# Patient Record
Sex: Male | Born: 1965 | Race: White | Hispanic: No | Marital: Married | Smoking: Former smoker
Health system: Southern US, Community
[De-identification: ages and names within clinical notes are randomized; demographics above are authoritative.]

## PROBLEM LIST (undated history)

## (undated) DIAGNOSIS — IMO0001 Reserved for inherently not codable concepts without codable children: Secondary | ICD-10-CM

## (undated) DIAGNOSIS — F1020 Alcohol dependence, uncomplicated: Secondary | ICD-10-CM

## (undated) DIAGNOSIS — E785 Hyperlipidemia, unspecified: Secondary | ICD-10-CM

## (undated) DIAGNOSIS — E119 Type 2 diabetes mellitus without complications: Secondary | ICD-10-CM

## (undated) DIAGNOSIS — G4733 Obstructive sleep apnea (adult) (pediatric): Secondary | ICD-10-CM

## (undated) DIAGNOSIS — K5792 Diverticulitis of intestine, part unspecified, without perforation or abscess without bleeding: Secondary | ICD-10-CM

## (undated) DIAGNOSIS — F329 Major depressive disorder, single episode, unspecified: Secondary | ICD-10-CM

## (undated) DIAGNOSIS — F419 Anxiety disorder, unspecified: Secondary | ICD-10-CM

## (undated) DIAGNOSIS — Z9989 Dependence on other enabling machines and devices: Secondary | ICD-10-CM

## (undated) DIAGNOSIS — R030 Elevated blood-pressure reading, without diagnosis of hypertension: Secondary | ICD-10-CM

## (undated) DIAGNOSIS — K760 Fatty (change of) liver, not elsewhere classified: Secondary | ICD-10-CM

## (undated) HISTORY — DX: Major depressive disorder, single episode, unspecified: F32.9

## (undated) HISTORY — DX: Reserved for inherently not codable concepts without codable children: IMO0001

## (undated) HISTORY — DX: Type 2 diabetes mellitus without complications: E11.9

## (undated) HISTORY — DX: Alcohol dependence, uncomplicated: F10.20

## (undated) HISTORY — DX: Hyperlipidemia, unspecified: E78.5

## (undated) HISTORY — DX: Anxiety disorder, unspecified: F41.9

## (undated) HISTORY — DX: Elevated blood-pressure reading, without diagnosis of hypertension: R03.0

---

## 1995-02-06 HISTORY — PX: APPENDECTOMY: SHX54

## 2005-12-07 ENCOUNTER — Emergency Department (HOSPITAL_COMMUNITY): Admission: EM | Admit: 2005-12-07 | Discharge: 2005-12-07 | Payer: Self-pay | Admitting: Family Medicine

## 2005-12-14 ENCOUNTER — Emergency Department (HOSPITAL_COMMUNITY): Admission: EM | Admit: 2005-12-14 | Discharge: 2005-12-14 | Payer: Self-pay | Admitting: Emergency Medicine

## 2007-03-22 ENCOUNTER — Emergency Department (HOSPITAL_COMMUNITY): Admission: EM | Admit: 2007-03-22 | Discharge: 2007-03-22 | Payer: Self-pay | Admitting: Emergency Medicine

## 2008-12-25 ENCOUNTER — Emergency Department (HOSPITAL_COMMUNITY): Admission: EM | Admit: 2008-12-25 | Discharge: 2008-12-25 | Payer: Self-pay | Admitting: Family Medicine

## 2010-09-29 ENCOUNTER — Encounter: Payer: Self-pay | Admitting: Pulmonary Disease

## 2010-09-29 ENCOUNTER — Ambulatory Visit (INDEPENDENT_AMBULATORY_CARE_PROVIDER_SITE_OTHER): Payer: PRIVATE HEALTH INSURANCE | Admitting: Pulmonary Disease

## 2010-09-29 VITALS — BP 120/80 | HR 88 | Temp 98.9°F | Ht 69.0 in | Wt 256.6 lb

## 2010-09-29 DIAGNOSIS — G4733 Obstructive sleep apnea (adult) (pediatric): Secondary | ICD-10-CM | POA: Insufficient documentation

## 2010-09-29 NOTE — Assessment & Plan Note (Signed)
The patient's history is very suggestive of clinically significant sleep apnea.  He has loud snoring at night, an abnormal breathing pattern during sleep, frequent awakenings, and nonrestorative sleep.  He also has significant daytime sleepiness.  I have had a long discussion with the pt about sleep apnea, including its impact on QOL and CV health.  I think he needs to have a sleep study for diagnosis, and the patient is agreeable to this approach.

## 2010-09-29 NOTE — Progress Notes (Signed)
  Subjective:    Patient ID: Ricky Molina, male    DOB: 08/19/1965, 45 y.o.   MRN: [id number]  HPI The patient is a 45 year old male who I was asked to see for possible obstructive sleep apnea.  He has been noted to have loud snoring, as well as an abnormal breathing pattern during sleep.  He denies any choking arousals.  He has frequent awakenings at night, and is never rested upon arising.  He notes definite sleepiness during the day with periods of inactivity, and sometimes takes naps at lunch.  He will follow sleep easily while reading, and can doze in the evenings with television.  He denies any sleepiness with driving.  Patient states that his weight is up over 30 pounds over the last 2 years, and his Epworth score today is 13.  Sleep Questionnaire: What time do you typically go to bed?( Between what hours) 9:30 to 10:30 pm How long does it take you to fall asleep? 5 mins How many times during the night do you wake up? 1 What time do you get out of bed to start your day? 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 (In pounds) 50 lb (22.68 kg) Have you ever had a sleep study before? No Do you currently use CPAP? No Do you wear oxygen at any time? No    Review of Systems  Constitutional: Positive for unexpected weight change. Negative for fever.  HENT: Positive for congestion, sneezing and dental problem. Negative for ear pain, nosebleeds, sore throat, rhinorrhea, trouble swallowing, postnasal drip and sinus pressure.   Eyes: Negative for redness and itching.  Respiratory: Positive for cough and shortness of breath. Negative for chest tightness and wheezing.   Cardiovascular: Negative for palpitations and leg swelling.  Gastrointestinal: Negative for nausea and vomiting.  Genitourinary: Negative for dysuria.  Musculoskeletal: Positive for joint swelling.  Skin: Negative for rash.  Neurological:  Negative for headaches.  Hematological: Does not bruise/bleed easily.  Psychiatric/Behavioral: Negative for dysphoric mood. The patient is not nervous/anxious.        Objective:   Physical Exam Constitutional:  Obese male, no acute distress  HENT:  Nares patent without discharge, but large turbinates  Oropharynx without exudate, palate and uvula are thick and elongated, narrowing posteriorly  Eyes:  Perrla, eomi, no scleral icterus  Neck:  No JVD, no TMG  Cardiovascular:  Normal rate, regular rhythm, no rubs or gallops.  No murmurs        Intact distal pulses  Pulmonary :  Normal breath sounds, no stridor or respiratory distress   No rales, rhonchi, or wheezing  Abdominal:  Soft, nondistended, bowel sounds present.  No tenderness noted.   Musculoskeletal:  No lower extremity edema noted.  Lymph Nodes:  No cervical lymphadenopathy noted  Skin:  No cyanosis noted  Neurologic:  Alert, appropriate, moves all 4 extremities without obvious deficit.         Assessment & Plan:

## 2010-09-29 NOTE — Patient Instructions (Signed)
Will schedule for a sleep study.  Will call once results are available Work on weight loss

## 2010-10-10 ENCOUNTER — Ambulatory Visit: Payer: PRIVATE HEALTH INSURANCE | Admitting: Pulmonary Disease

## 2010-10-10 ENCOUNTER — Ambulatory Visit (INDEPENDENT_AMBULATORY_CARE_PROVIDER_SITE_OTHER): Payer: PRIVATE HEALTH INSURANCE | Admitting: Pulmonary Disease

## 2010-10-10 DIAGNOSIS — G4733 Obstructive sleep apnea (adult) (pediatric): Secondary | ICD-10-CM

## 2010-10-11 ENCOUNTER — Other Ambulatory Visit: Payer: Self-pay | Admitting: Pulmonary Disease

## 2010-10-11 DIAGNOSIS — G4733 Obstructive sleep apnea (adult) (pediatric): Secondary | ICD-10-CM

## 2010-10-13 ENCOUNTER — Other Ambulatory Visit: Payer: Self-pay | Admitting: Pulmonary Disease

## 2010-10-13 DIAGNOSIS — G4733 Obstructive sleep apnea (adult) (pediatric): Secondary | ICD-10-CM

## 2010-10-16 ENCOUNTER — Other Ambulatory Visit: Payer: Self-pay | Admitting: Pulmonary Disease

## 2010-10-16 DIAGNOSIS — G4733 Obstructive sleep apnea (adult) (pediatric): Secondary | ICD-10-CM

## 2010-10-27 LAB — I-STAT 8, (EC8 V) (CONVERTED LAB)
BUN: 5 — ABNORMAL LOW
Hemoglobin: 15.6
Sodium: 139
pCO2, Ven: 42.8 — ABNORMAL LOW
pH, Ven: 7.388 — ABNORMAL HIGH

## 2010-10-27 LAB — DIFFERENTIAL
Basophils Absolute: 0
Eosinophils Absolute: 0.1
Eosinophils Relative: 2
Lymphocytes Relative: 41

## 2010-10-27 LAB — POCT I-STAT CREATININE: Operator id: 265201

## 2010-10-27 LAB — CBC
MCV: 95.5
RBC: 4.58
RDW: 12.8

## 2010-10-27 LAB — POCT CARDIAC MARKERS: Troponin i, poc: <0.05

## 2010-12-07 ENCOUNTER — Other Ambulatory Visit: Payer: Self-pay | Admitting: Pulmonary Disease

## 2010-12-07 DIAGNOSIS — G4733 Obstructive sleep apnea (adult) (pediatric): Secondary | ICD-10-CM

## 2011-01-05 ENCOUNTER — Ambulatory Visit: Payer: PRIVATE HEALTH INSURANCE | Admitting: Pulmonary Disease

## 2011-01-12 ENCOUNTER — Encounter: Payer: Self-pay | Admitting: Pulmonary Disease

## 2011-01-12 ENCOUNTER — Ambulatory Visit (INDEPENDENT_AMBULATORY_CARE_PROVIDER_SITE_OTHER): Payer: PRIVATE HEALTH INSURANCE | Admitting: Pulmonary Disease

## 2011-01-12 DIAGNOSIS — G4733 Obstructive sleep apnea (adult) (pediatric): Secondary | ICD-10-CM

## 2011-01-12 DIAGNOSIS — Z23 Encounter for immunization: Secondary | ICD-10-CM

## 2011-01-12 MED ORDER — VARENICLINE TARTRATE 1 MG PO TABS: 1.0000 mg | ORAL_TABLET | Freq: Two times a day (BID) | ORAL | Status: DC

## 2011-01-12 MED ORDER — VARENICLINE TARTRATE 0.5 MG X 11 & 1 MG X 42 PO MISC: ORAL | Status: DC

## 2011-01-12 NOTE — Patient Instructions (Signed)
Will have cpap machine put on pressure of 14. Work on [employer] loss Will give you a prescription for chantix for smoking cessation.  If doing well, followup with me in one year.

## 2011-01-12 NOTE — Assessment & Plan Note (Signed)
The patient is doing fairly well with CPAP, and has excellent compliance on his recent download.  I've asked him to continue on this, and to work aggressively on weight loss.  He is to let me know if he has any issues with his CPAP, including worsening mask leaks.  He will followup with me in one year if doing well.

## 2011-01-12 NOTE — Progress Notes (Signed)
  Subjective:    Patient ID: Ricky Molina, male    DOB: [birth year], 45 y.o.   MRN: [id number]  HPI Patient comes in today for followup of his obstructive sleep apnea.  His download today shows excellent compliance, no significant mask leak, and an optimal CPAP pressure of 14 cm.  The patient feels that CPAP has helped his sleep, and that he is dreaming more than he ever has.  He is not sure if it is made a big impact to his daytime alertness, but he appears much more awake and bright by my eye.  He has occasional leaks with a full facemask, but these do not occur frequently.  If this problem worsens, I have asked him to consider trying a different mask.  The patient is also asking me about something to help him quit smoking.  I have had a long conversation with him about this.   Review of Systems  Constitutional: Negative for fever and unexpected weight change.  HENT: Positive for ear pain, congestion, rhinorrhea and postnasal drip. Negative for nosebleeds, sore throat, sneezing, trouble swallowing, dental problem and sinus pressure.   Eyes: Positive for redness and itching.  Respiratory: Positive for cough, shortness of breath and wheezing. Negative for chest tightness.   Cardiovascular: Negative for palpitations and leg swelling.  Gastrointestinal: Negative for nausea and vomiting.  Genitourinary: Negative for dysuria.  Musculoskeletal: Negative for joint swelling.  Skin: Negative for rash.  Neurological: Negative for headaches.  Hematological: Does not bruise/bleed easily.  Psychiatric/Behavioral: Negative for dysphoric mood. The patient is not nervous/anxious.        Objective:   Physical Exam Overweight male in no acute distress No skin breakdown or pressure necrosis from the CPAP mask Lower extremities without edema, no cyanosis noted Alert and oriented, moves all 4 extremities.       Assessment & Plan:

## 2011-03-04 ENCOUNTER — Other Ambulatory Visit: Payer: Self-pay | Admitting: Pulmonary Disease

## 2011-03-23 ENCOUNTER — Inpatient Hospital Stay (HOSPITAL_COMMUNITY)
Admission: EM | Admit: 2011-03-23 | Discharge: 2011-03-25 | DRG: 392 | Disposition: A | Discharge status: Home / Self Care | Payer: PRIVATE HEALTH INSURANCE | Attending: Internal Medicine | Admitting: Internal Medicine

## 2011-03-23 ENCOUNTER — Emergency Department (HOSPITAL_COMMUNITY): Payer: PRIVATE HEALTH INSURANCE

## 2011-03-23 ENCOUNTER — Encounter (HOSPITAL_COMMUNITY): Payer: Self-pay | Admitting: Emergency Medicine

## 2011-03-23 DIAGNOSIS — R112 Nausea with vomiting, unspecified: Secondary | ICD-10-CM | POA: Diagnosis present

## 2011-03-23 DIAGNOSIS — F1020 Alcohol dependence, uncomplicated: Secondary | ICD-10-CM | POA: Diagnosis present

## 2011-03-23 DIAGNOSIS — Z87891 Personal history of nicotine dependence: Secondary | ICD-10-CM

## 2011-03-23 DIAGNOSIS — K760 Fatty (change of) liver, not elsewhere classified: Secondary | ICD-10-CM | POA: Diagnosis present

## 2011-03-23 DIAGNOSIS — E871 Hypo-osmolality and hyponatremia: Secondary | ICD-10-CM | POA: Diagnosis present

## 2011-03-23 DIAGNOSIS — K5792 Diverticulitis of intestine, part unspecified, without perforation or abscess without bleeding: Secondary | ICD-10-CM | POA: Diagnosis present

## 2011-03-23 DIAGNOSIS — R1031 Right lower quadrant pain: Secondary | ICD-10-CM | POA: Diagnosis present

## 2011-03-23 DIAGNOSIS — K7689 Other specified diseases of liver: Secondary | ICD-10-CM | POA: Diagnosis present

## 2011-03-23 DIAGNOSIS — G4733 Obstructive sleep apnea (adult) (pediatric): Secondary | ICD-10-CM | POA: Diagnosis present

## 2011-03-23 DIAGNOSIS — F1010 Alcohol abuse, uncomplicated: Secondary | ICD-10-CM | POA: Diagnosis present

## 2011-03-23 DIAGNOSIS — K5732 Diverticulitis of large intestine without perforation or abscess without bleeding: Principal | ICD-10-CM | POA: Diagnosis present

## 2011-03-23 DIAGNOSIS — N390 Urinary tract infection, site not specified: Secondary | ICD-10-CM | POA: Diagnosis present

## 2011-03-23 DIAGNOSIS — K649 Unspecified hemorrhoids: Secondary | ICD-10-CM | POA: Diagnosis present

## 2011-03-23 DIAGNOSIS — K7010 Alcoholic hepatitis without ascites: Secondary | ICD-10-CM | POA: Diagnosis present

## 2011-03-23 HISTORY — DX: Diverticulitis of intestine, part unspecified, without perforation or abscess without bleeding: K57.92

## 2011-03-23 HISTORY — DX: Alcohol dependence, uncomplicated: F10.20

## 2011-03-23 HISTORY — DX: Dependence on other enabling machines and devices: Z99.89

## 2011-03-23 HISTORY — DX: Obstructive sleep apnea (adult) (pediatric): G47.33

## 2011-03-23 HISTORY — DX: Fatty (change of) liver, not elsewhere classified: K76.0

## 2011-03-23 LAB — CBC
MCHC: 36 g/dL (ref 30.0–36.0)
MCV: 98.2 fL (ref 78.0–100.0)
RBC: 5.46 MIL/uL (ref 4.22–5.81)
WBC: 11.5 10*3/uL — ABNORMAL HIGH (ref 4.0–10.5)

## 2011-03-23 LAB — COMPREHENSIVE METABOLIC PANEL
AST: 72 U/L — ABNORMAL HIGH (ref 0–37)
Albumin: 3.4 g/dL — ABNORMAL LOW (ref 3.5–5.2)
BUN: 9 mg/dL (ref 6–23)
Creatinine, Ser: 0.7 mg/dL (ref 0.50–1.35)
Glucose, Bld: 138 mg/dL — ABNORMAL HIGH (ref 70–99)
Sodium: 132 mEq/L — ABNORMAL LOW (ref 135–145)

## 2011-03-23 LAB — URINALYSIS, ROUTINE W REFLEX MICROSCOPIC
Hgb urine dipstick: NEGATIVE
Nitrite: POSITIVE — AB
Protein, ur: NEGATIVE mg/dL
Urobilinogen, UA: 1 mg/dL (ref 0.0–1.0)

## 2011-03-23 LAB — URINE MICROSCOPIC-ADD ON

## 2011-03-23 LAB — DIFFERENTIAL
Basophils Absolute: 0.1 10*3/uL (ref 0.0–0.1)
Basophils Relative: 0 % (ref 0–1)
Eosinophils Absolute: 0.1 10*3/uL (ref 0.0–0.7)
Eosinophils Relative: 1 % (ref 0–5)
Monocytes Relative: 11 % (ref 3–12)
Neutro Abs: 7 10*3/uL (ref 1.7–7.7)

## 2011-03-23 MED ORDER — METRONIDAZOLE IN NACL 5-0.79 MG/ML-% IV SOLN
500.0000 mg | Freq: Once | INTRAVENOUS | Status: AC
  Administered 2011-03-23: 500 mg via INTRAVENOUS
  Filled 2011-03-23: qty 100

## 2011-03-23 MED ORDER — CIPROFLOXACIN IN D5W 400 MG/200ML IV SOLN
400.0000 mg | Freq: Once | INTRAVENOUS | Status: AC
  Administered 2011-03-23: 400 mg via INTRAVENOUS
  Filled 2011-03-23: qty 200

## 2011-03-23 MED ORDER — ONDANSETRON HCL 4 MG PO TABS: 4.0000 mg | ORAL_TABLET | Freq: Four times a day (QID) | ORAL | Status: DC | PRN

## 2011-03-23 MED ORDER — MORPHINE SULFATE 4 MG/ML IJ SOLN
INTRAMUSCULAR | Status: AC
  Administered 2011-03-23: 16:00:00
  Filled 2011-03-23: qty 1

## 2011-03-23 MED ORDER — MORPHINE SULFATE 4 MG/ML IJ SOLN
INTRAMUSCULAR | Status: AC
  Administered 2011-03-23: 4 mg
  Filled 2011-03-23: qty 1

## 2011-03-23 MED ORDER — KETOROLAC TROMETHAMINE 30 MG/ML IJ SOLN
30.0000 mg | Freq: Once | INTRAMUSCULAR | Status: AC
  Administered 2011-03-23: 30 mg via INTRAVENOUS
  Filled 2011-03-23: qty 1

## 2011-03-23 MED ORDER — SODIUM CHLORIDE 0.9 % IV BOLUS (SEPSIS)
1000.0000 mL | Freq: Once | INTRAVENOUS | Status: AC
  Administered 2011-03-23: 1000 mL via INTRAVENOUS

## 2011-03-23 MED ORDER — ONDANSETRON HCL 4 MG/2ML IJ SOLN: 4.0000 mg | Freq: Four times a day (QID) | INTRAMUSCULAR | Status: DC | PRN

## 2011-03-23 MED ORDER — ONDANSETRON HCL 4 MG/2ML IJ SOLN
INTRAMUSCULAR | Status: AC
  Administered 2011-03-23: 4 mg
  Filled 2011-03-23: qty 2

## 2011-03-23 MED ORDER — METRONIDAZOLE IN NACL 5-0.79 MG/ML-% IV SOLN
500.0000 mg | Freq: Three times a day (TID) | INTRAVENOUS | Status: DC
  Administered 2011-03-23 – 2011-03-25 (×5): 500 mg via INTRAVENOUS
  Filled 2011-03-23 (×7): qty 100

## 2011-03-23 MED ORDER — CIPROFLOXACIN IN D5W 400 MG/200ML IV SOLN
400.0000 mg | Freq: Two times a day (BID) | INTRAVENOUS | Status: DC
  Administered 2011-03-23 – 2011-03-24 (×3): 400 mg via INTRAVENOUS
  Filled 2011-03-23 (×5): qty 200

## 2011-03-23 MED ORDER — LORAZEPAM 2 MG/ML IJ SOLN: 1.0000 mg | INTRAMUSCULAR | Status: DC | PRN

## 2011-03-23 MED ORDER — IOHEXOL 300 MG/ML  SOLN
100.0000 mL | Freq: Once | INTRAMUSCULAR | Status: AC | PRN
  Administered 2011-03-23: 100 mL via INTRAVENOUS

## 2011-03-23 MED ORDER — MORPHINE SULFATE 4 MG/ML IJ SOLN
4.0000 mg | Freq: Once | INTRAMUSCULAR | Status: AC
  Administered 2011-03-23: 4 mg via INTRAVENOUS
  Filled 2011-03-23: qty 1

## 2011-03-23 MED ORDER — ONDANSETRON HCL 4 MG/2ML IJ SOLN
4.0000 mg | Freq: Once | INTRAMUSCULAR | Status: AC
  Administered 2011-03-23: 4 mg via INTRAVENOUS
  Filled 2011-03-23: qty 2

## 2011-03-23 MED ORDER — MORPHINE SULFATE 2 MG/ML IJ SOLN: 2.0000 mg | INTRAMUSCULAR | Status: DC | PRN

## 2011-03-23 MED ORDER — POTASSIUM CHLORIDE IN NACL 20-0.9 MEQ/L-% IV SOLN
INTRAVENOUS | Status: DC
  Administered 2011-03-23 – 2011-03-24 (×3): via INTRAVENOUS
  Filled 2011-03-23 (×7): qty 1000

## 2011-03-23 NOTE — ED Notes (Signed)
Pt c/o bilateral flank pain, right worse than left for 2 days.  Pt states lower abd pain began yesterday. Pt reports nausea, denies V/D.  Pt denies urinary s/sx.

## 2011-03-23 NOTE — ED Notes (Signed)
Oral contrast finished.

## 2011-03-23 NOTE — ED Notes (Signed)
MD at bedside. 

## 2011-03-23 NOTE — ED Provider Notes (Signed)
History     CSN: [id number]  Arrival date & time 03/23/11  4098   First MD Initiated Contact with Patient 03/23/11 0818      Chief Complaint  Patient presents with  . Flank Pain    (Consider location/radiation/quality/duration/timing/severity/associated sxs/prior treatment) Patient is a 46 y.o. male presenting with flank pain. The history is provided by the patient.  Flank Pain This is a new problem. The current episode started 2 days ago. The problem has been rapidly worsening. Associated symptoms include abdominal pain. Pertinent negatives include no chest pain, no headaches and no shortness of breath.   Pt with bl low back pain for several days had acute worsening of R flank pain with radiation into RLQ and R groin. Pt states pressure feeling in suprapubic area. Denies fever, chills, V/D/C. Denies dysuria, frequency, and hematuria.  Past Medical History  Diagnosis Date  . Hyperlipidemia   . Allergic rhinitis     Past Surgical History  Procedure Date  . Appendectomy 1997    Family History  Problem Relation Age of Onset  . Emphysema Mother   . Allergies Sister   . Heart failure Father   . Stroke Father   . Hypertension Mother   . Rheum arthritis Father   . Skin cancer Mother     nose  . Breast cancer Mother   . Diabetes Father   . Hypothyroidism Sister     History  Substance Use Topics  . Smoking status: Current Everyday Smoker -- 1.0 packs/day for 30 years    Types: Cigarettes  . Smokeless tobacco: Not on file  . Alcohol Use: Not on file      Review of Systems  Constitutional: Negative for fever and chills.  Respiratory: Negative for shortness of breath.   Cardiovascular: Negative for chest pain.  Gastrointestinal: Positive for nausea and abdominal pain. Negative for vomiting and diarrhea.  Genitourinary: Positive for flank pain. Negative for dysuria, frequency, hematuria and difficulty urinating.  Musculoskeletal: Positive for back pain.  Skin:  Negative for color change, pallor, rash and wound.  Neurological: Negative for dizziness, weakness, numbness and headaches.    Allergies  Review of patient's allergies indicates no known allergies.  Home Medications   Current Outpatient Rx  Name Route Sig Dispense Refill  . CALCIUM CARBONATE ANTACID 500 MG PO CHEW Oral Chew 1 tablet by mouth daily as needed. For antacid.    . IBUPROFEN 200 MG PO TABS Oral Take 400-800 mg by mouth every 8 (eight) hours as needed. For pain.      BP 147/90  Pulse 97  Temp(Src) 98.7 F (37.1 C) (Oral)  Resp 18  Ht 5\' 9"  (1.753 m)  Wt 260 lb (117.935 kg)  BMI 38.40 kg/m2  SpO2 95%  Physical Exam  Nursing note and vitals reviewed. Constitutional: He is oriented to person, place, and time. He appears well-developed and well-nourished. He appears distressed (pt obviously uncomfortable).  HENT:  Head: Normocephalic and atraumatic.  Mouth/Throat: Oropharynx is clear and moist.  Eyes: EOM are normal. Pupils are equal, round, and reactive to light.  Neck: Normal range of motion. Neck supple.  Cardiovascular: Normal rate and regular rhythm.   Pulmonary/Chest: Effort normal and breath sounds normal. No respiratory distress. He has no wheezes. He has no rales.  Abdominal: Soft. Bowel sounds are normal. He exhibits mass (Ventral hernia which is easily reduced). There is tenderness (Mild RLQ ttp without guarding or rebound). There is no rebound and no guarding.  Musculoskeletal: Normal  range of motion. He exhibits no edema and no tenderness.  Neurological: He is alert and oriented to person, place, and time.  Skin: Skin is warm and dry. No rash noted. No erythema.  Psychiatric: He has a normal mood and affect. His behavior is normal.    ED Course  Procedures (including critical care time)  Labs Reviewed  CBC - Abnormal; Notable for the following:    WBC 11.5 (*)    Hemoglobin 19.3 (*)    HCT 53.6 (*)    MCH 35.3 (*)    All other components within  normal limits  DIFFERENTIAL - Abnormal; Notable for the following:    Monocytes Absolute 1.3 (*)    All other components within normal limits  COMPREHENSIVE METABOLIC PANEL - Abnormal; Notable for the following:    Sodium 132 (*)    Glucose, Bld 138 (*)    Albumin 3.4 (*)    AST 72 (*)    ALT 91 (*)    Alkaline Phosphatase 127 (*)    All other components within normal limits  URINALYSIS, ROUTINE W REFLEX MICROSCOPIC - Abnormal; Notable for the following:    Color, Urine ORANGE (*) BIOCHEMICALS MAY BE AFFECTED BY COLOR   APPearance CLOUDY (*)    Bilirubin Urine SMALL (*)    Ketones, ur TRACE (*)    Nitrite POSITIVE (*)    Leukocytes, UA TRACE (*)    All other components within normal limits  URINE MICROSCOPIC-ADD ON - Abnormal; Notable for the following:    Bacteria, UA MANY (*)    All other components within normal limits  URINE CULTURE   Ct Abdomen Pelvis Wo Contrast  03/23/2011  *RADIOLOGY REPORT*  Clinical Data: Bilateral flank pain right greater than left, nausea  CT ABDOMEN AND PELVIS WITHOUT CONTRAST  Technique:  Multidetector CT imaging of the abdomen and pelvis was performed following the standard protocol without intravenous contrast.  Comparison: CT scan 12/07/2005  Findings: Lung bases are unremarkable.  Mild degenerative changes noted lumbar spine on sagittal images.  There is fatty infiltration of the liver.  Mild distended gallbladder without evidence of calcified gallstones.  No pericholecystic fluid.  The unenhanced pancreas and adrenal glands are unremarkable.  The unenhanced kidneys shows a lobulated contour.  There is no nephrolithiasis.  No hydronephrosis or hydroureter.  There is trace perisplenic ascites.  Small amount of ascites noted bilateral paracolic gutters.  In axial image 46 there is a short segment mild thickening of the distal duodenal wall.  This is confirmed in the sagittal image 37. There is free fluid just inferior and adjacent to this segment. Findings  are highly suspicious for focal enteritis, focal inflammatory changes or ulcer disease.  Clinical correlation is necessary.  No small bowel or colonic obstruction.  Small amount of ascites noted in the right lower quadrant pericecal region.  Right colonic diverticula are noted.  There is no clear evidence of acute diverticulitis.  Sigmoid colon diverticula are noted without evidence of acute diverticulitis.  No distal colonic obstruction.  Bilateral distal ureter is unremarkable.  No calcified ureteral calculi are noted.  No calcified calculi are noted within urinary bladder.  Prostate gland and seminal vesicles are unremarkable.  IMPRESSION:  1.  No nephrolithiasis.  No hydronephrosis or hydroureter.  No calcified ureteral calculi are noted.  2. There is small amount of peri splenic ascites.  Ascites noted bilateral paracolic gutters.  Small amount of ascites noted in the right lower quadrant.  Free fluid is  noted inferior and adjacent to distal duodenum.  There is focal thickening of distal duodenal wall.  Findings are suspicious for focal enteritis, focal small bowel inflammation or ulcer disease.  Clinical correlation is necessary.  Further evaluation with CT scan with IV and oral contrast is recommended.  3.  Colonic diverticula are noted right colon distal left colon and sigmoid colon without definite evidence of acute diverticulitis. 4.  Unremarkable distal ureter bilaterally.  Unremarkable urinary bladder. 5.  Fatty infiltration of the liver.  Original Report Authenticated By: Natasha Mead, M.D.   Ct Abdomen Pelvis W Contrast  03/23/2011  *RADIOLOGY REPORT*  Clinical Data: Right flank/abdominal pain, nausea, prior appendectomy  CT ABDOMEN AND PELVIS WITH CONTRAST  Technique:  Multidetector CT imaging of the abdomen and pelvis was performed following the standard protocol during bolus administration of intravenous contrast.  Contrast: OMNIPAQUE IOHEXOL 300 MG/ML IV SOLN  Comparison: 03/23/2011 at 0852  hours  Findings: Trace pleural effusions.  Lung bases are otherwise essentially clear.  Hepatic steatosis.  Spleen, pancreas, and adrenal glands are within normal limits.  Gallbladder is unremarkable.  No intrahepatic or extrahepatic ductal dilatation.  Kidneys are within normal limits.  No hydronephrosis.  No evidence of bowel obstruction.  Terminal ileum is underdistended.  The region of suspected mild duodenal wall thickening the right abdomen appears normal on delayed imaging, likely reflecting focal peristalsis.  Extensive colonic diverticulosis. Possible mild associated pericolonic stranding in the right upper abdomen (series 2/image 39), equivocal, but possibly reflecting diverticulitis.  No drainable fluid collection or abscess.  No free air.  Atherosclerotic calcifications of the abdominal aorta and branch vessels.  Small volume abdominopelvic ascites/mesenteric stranding.  No suspicious abdominopelvic lymphadenopathy.  Prostate is unremarkable.  Bladder is underdistended.  Degenerative changes of the visualized thoracolumbar spine.  IMPRESSION: Extensive colonic diverticulosis.  Possible diverticulitis in the right upper abdomen, equivocal.  No drainable fluid collection or abscess.  No free air.  Trace bilateral pleural effusions and small volume abdominopelvic ascites/mesenteric stranding.  Hepatic steatosis.  Bladder is underdistended.  Original Report Authenticated By: Charline Bills, M.D.     1. Diverticulitis   2. UTI (lower urinary tract infection)       MDM   Pt vomiting in ED after PO trial. Admit for IV abx. [facility] to see and admit      Loren Racer, MD 03/23/11 [phone]

## 2011-03-23 NOTE — H&P (Signed)
Chief Complaint:  Abdominal pain  HPI: 46 year old male who has been having right-sided abdominal pain for 2 days now. He states that the pain started in the lower right back area and now has moved to the right lower quadrant and also in the left lower quadrant. This morning he started having more persistent severe pain and is developed some nausea and vomiting today in the emergency department. He denies any dysuria or any foul smell to his urine that is different. He feels like his abdomen is more bloated than normal. He did have a bowel movement this morning that was nonbloody. His vomited and is also greenish in color and nonbloody. He denies any fevers. His abdominal pain is much better with IV morphine in the emergency department and some Zofran. He's never had diverticulitis in the past. His CAT scan shows questionable diverticulitis.  Review of Systems:  Otherwise negative  Past Medical History: Past Medical History  Diagnosis Date  . Hyperlipidemia   . Allergic rhinitis    Past Surgical History  Procedure Date  . Appendectomy 1997    Medications: Prior to Admission medications   Medication Sig Start Date End Date Taking? Authorizing Provider  calcium carbonate (TUMS - DOSED IN MG ELEMENTAL CALCIUM) 500 MG chewable tablet Chew 1 tablet by mouth daily as needed. For antacid.   Yes Historical Provider, MD  ibuprofen (ADVIL,MOTRIN) 200 MG tablet Take 400-800 mg by mouth every 8 (eight) hours as needed. For pain.   Yes Historical Provider, MD    Allergies:  No Known Allergies  Social History:  reports that he quit smoking about 2 months ago. His smoking use included Cigarettes. He has a 30 pack-year smoking history. He has never used smokeless tobacco. He reports that he drinks about 25.2 ounces of alcohol per week. He reports that he does not use illicit drugs.  Family History: Family History  Problem Relation Age of Onset  . Emphysema Mother   . Hypertension Mother   .  Skin cancer Mother     nose  . Breast cancer Mother   . Allergies Sister   . Heart failure Father   . Stroke Father   . Rheum arthritis Father   . Diabetes Father   . Hypothyroidism Sister     Physical Exam: Filed Vitals:   03/23/11 0815 03/23/11 0901 03/23/11 1200 03/23/11 1528  BP: 160/113  134/89 147/90  Pulse: 140 114 99 97  Temp: 98 F (36.7 C)  98.7 F (37.1 C) 98.7 F (37.1 C)  TempSrc: Oral  Oral Oral  Resp: 20  18 18   Height:      Weight:      SpO2: 98%  94% 95%   BP 135/82  Pulse 96  Temp(Src) 98.7 F (37.1 C) (Oral)  Resp 18  Ht 5\' 9"  (1.753 m)  Wt 117.935 kg (260 lb)  BMI 38.40 kg/m2  SpO2 95% General appearance: alert, cooperative and no distress Lungs: clear to auscultation bilaterally Heart: regular rate and rhythm, S1, S2 normal, no murmur, click, rub or gallop Abdomen: Soft mildly distended positive bowel sounds no rebound no guarding tender to palpation in the right lower quadrant nonacute abdomen Extremities: extremities normal, atraumatic, no cyanosis or edema Pulses: 2+ and symmetric Skin: Skin color, texture, turgor normal. No rashes or lesions Neurologic: Grossly normal    Labs on Admission:   [facility] 03/23/11 0845  NA 132*  K 4.3  CL 97  CO2 23  GLUCOSE 138*  BUN 9  CREATININE 0.70  CALCIUM 9.1  MG --  PHOS --    Basename 03/23/11 0845  AST 72*  ALT 91*  ALKPHOS 127*  BILITOT 1.2  PROT 6.9  ALBUMIN 3.4*    Basename 03/23/11 0845  WBC 11.5*  NEUTROABS 7.0  HGB 19.3*  HCT 53.6*  MCV 98.2  PLT 227   Radiological Exams on Admission: Ct Abdomen Pelvis Wo Contrast  03/23/2011  *RADIOLOGY REPORT*  Clinical Data: Bilateral flank pain right greater than left, nausea  CT ABDOMEN AND PELVIS WITHOUT CONTRAST  Technique:  Multidetector CT imaging of the abdomen and pelvis was performed following the standard protocol without intravenous contrast.  Comparison: CT scan 12/07/2005  Findings: Lung bases are unremarkable.  Mild  degenerative changes noted lumbar spine on sagittal images.  There is fatty infiltration of the liver.  Mild distended gallbladder without evidence of calcified gallstones.  No pericholecystic fluid.  The unenhanced pancreas and adrenal glands are unremarkable.  The unenhanced kidneys shows a lobulated contour.  There is no nephrolithiasis.  No hydronephrosis or hydroureter.  There is trace perisplenic ascites.  Small amount of ascites noted bilateral paracolic gutters.  In axial image 46 there is a short segment mild thickening of the distal duodenal wall.  This is confirmed in the sagittal image 37. There is free fluid just inferior and adjacent to this segment. Findings are highly suspicious for focal enteritis, focal inflammatory changes or ulcer disease.  Clinical correlation is necessary.  No small bowel or colonic obstruction.  Small amount of ascites noted in the right lower quadrant pericecal region.  Right colonic diverticula are noted.  There is no clear evidence of acute diverticulitis.  Sigmoid colon diverticula are noted without evidence of acute diverticulitis.  No distal colonic obstruction.  Bilateral distal ureter is unremarkable.  No calcified ureteral calculi are noted.  No calcified calculi are noted within urinary bladder.  Prostate gland and seminal vesicles are unremarkable.  IMPRESSION:  1.  No nephrolithiasis.  No hydronephrosis or hydroureter.  No calcified ureteral calculi are noted.  2. There is small amount of peri splenic ascites.  Ascites noted bilateral paracolic gutters.  Small amount of ascites noted in the right lower quadrant.  Free fluid is noted inferior and adjacent to distal duodenum.  There is focal thickening of distal duodenal wall.  Findings are suspicious for focal enteritis, focal small bowel inflammation or ulcer disease.  Clinical correlation is necessary.  Further evaluation with CT scan with IV and oral contrast is recommended.  3.  Colonic diverticula are noted  right colon distal left colon and sigmoid colon without definite evidence of acute diverticulitis. 4.  Unremarkable distal ureter bilaterally.  Unremarkable urinary bladder. 5.  Fatty infiltration of the liver.  Original Report Authenticated By: Natasha Mead, M.D.   Ct Abdomen Pelvis W Contrast  03/23/2011  *RADIOLOGY REPORT*  Clinical Data: Right flank/abdominal pain, nausea, prior appendectomy  CT ABDOMEN AND PELVIS WITH CONTRAST  Technique:  Multidetector CT imaging of the abdomen and pelvis was performed following the standard protocol during bolus administration of intravenous contrast.  Contrast: OMNIPAQUE IOHEXOL 300 MG/ML IV SOLN  Comparison: 03/23/2011 at 0852 hours  Findings: Trace pleural effusions.  Lung bases are otherwise essentially clear.  Hepatic steatosis.  Spleen, pancreas, and adrenal glands are within normal limits.  Gallbladder is unremarkable.  No intrahepatic or extrahepatic ductal dilatation.  Kidneys are within normal limits.  No hydronephrosis.  No evidence of bowel obstruction.  Terminal ileum is  underdistended.  The region of suspected mild duodenal wall thickening the right abdomen appears normal on delayed imaging, likely reflecting focal peristalsis.  Extensive colonic diverticulosis. Possible mild associated pericolonic stranding in the right upper abdomen (series 2/image 39), equivocal, but possibly reflecting diverticulitis.  No drainable fluid collection or abscess.  No free air.  Atherosclerotic calcifications of the abdominal aorta and branch vessels.  Small volume abdominopelvic ascites/mesenteric stranding.  No suspicious abdominopelvic lymphadenopathy.  Prostate is unremarkable.  Bladder is underdistended.  Degenerative changes of the visualized thoracolumbar spine.  IMPRESSION: Extensive colonic diverticulosis.  Possible diverticulitis in the right upper abdomen, equivocal.  No drainable fluid collection or abscess.  No free air.  Trace bilateral pleural effusions and  small volume abdominopelvic ascites/mesenteric stranding.  Hepatic steatosis.  Bladder is underdistended.  Original Report Authenticated By: Charline Bills, M.D.    Assessment/Plan Present on Admission:  46 year old male with abdominal pain nausea and vomiting likely secondary to acute diverticulitis  .Abdominal pain, RLQ (right lower quadrant) .Diverticulitis .UTI (lower urinary tract infection) .ETOH abuse .OSA (obstructive sleep apnea)  We'll place the patient on ciprofloxacin and Flagyl. The patient's vomiting persist we'll place NG tube. We'll place n.p.o. except ice chips. We'll send urine off for culture. For his significant alcohol abuse we'll place him on Ativan as needed. For his sleep apnea continue CPAP each bedtime. If his abdominal pain and symptoms do not improve significantly over the next 24-48 hours would consider general surgery consultation. We'll manage conservatively and with antibiotics at this point. Placed on IV fluids.   Keldan Eplin A [phone] 03/23/2011, 6:24 PM

## 2011-03-24 ENCOUNTER — Encounter (HOSPITAL_COMMUNITY): Payer: Self-pay | Admitting: Internal Medicine

## 2011-03-24 DIAGNOSIS — F1020 Alcohol dependence, uncomplicated: Secondary | ICD-10-CM

## 2011-03-24 DIAGNOSIS — K7010 Alcoholic hepatitis without ascites: Secondary | ICD-10-CM | POA: Diagnosis present

## 2011-03-24 DIAGNOSIS — E871 Hypo-osmolality and hyponatremia: Secondary | ICD-10-CM | POA: Diagnosis present

## 2011-03-24 HISTORY — DX: Alcohol dependence, uncomplicated: F10.20

## 2011-03-24 LAB — BASIC METABOLIC PANEL
BUN: 8 mg/dL (ref 6–23)
CO2: 29 mEq/L (ref 19–32)
Chloride: 100 mEq/L (ref 96–112)
GFR calc Af Amer: >90 mL/min (ref >90)
Potassium: 4.2 mEq/L (ref 3.5–5.1)

## 2011-03-24 LAB — CBC
HCT: 41.3 % (ref 39.0–52.0)
Hemoglobin: 14.5 g/dL (ref 13.0–17.0)
MCHC: 35.1 g/dL (ref 30.0–36.0)

## 2011-03-24 LAB — URINE CULTURE: Culture: NO GROWTH

## 2011-03-24 MED ORDER — TRAMADOL HCL 50 MG PO TABS
50.0000 mg | ORAL_TABLET | Freq: Once | ORAL | Status: AC
  Administered 2011-03-24: 50 mg via ORAL
  Filled 2011-03-24: qty 1

## 2011-03-24 MED ORDER — FOLIC ACID 1 MG PO TABS
1.0000 mg | ORAL_TABLET | Freq: Every day | ORAL | Status: DC
  Administered 2011-03-24 – 2011-03-25 (×2): 1 mg via ORAL
  Filled 2011-03-24 (×2): qty 1

## 2011-03-24 MED ORDER — VITAMIN B-1 100 MG PO TABS
100.0000 mg | ORAL_TABLET | Freq: Every day | ORAL | Status: DC
  Administered 2011-03-24 – 2011-03-25 (×2): 100 mg via ORAL
  Filled 2011-03-24 (×2): qty 1

## 2011-03-24 NOTE — Progress Notes (Signed)
PATIENT DETAILS Name: Ricky Molina Age: 46 y.o. Sex: male Date of Birth: 03-25-1965 Admit Date: 03/23/2011 PCP:No primary provider on file. Emergency contact:   Medical Consults:  None  Other Consults:  None  Interval History: Mr. Nichol is a 46 year old male with a PMH of alcoholism who presented to the hospital on 03/23/11 with abdominal pain.  A CT scan confirmed diverticulitis.  ROS: Mr. Tomb complains of abdominal bloating, but has much less abdominal pain today.  He has not had any further N/V and reports that he feels hungry.   Objective: Vital signs in last 24 hours: Temp:  [97.7 F (36.5 C)-98.7 F (37.1 C)] 98.5 F (36.9 C) (02/16 0350) Pulse Rate:  [89-99] 96  (02/16 0350) Resp:  [16-20] 20  (02/16 0350) BP: (132-147)/(82-92) 132/86 mmHg (02/16 0350) SpO2:  [93 %-96 %] 95 % (02/16 0350) Weight:  [122.2 kg (269 lb 6.4 oz)] 122.2 kg (269 lb 6.4 oz) (02/15 2132) Weight change:  Last BM Date: 03/23/11  Intake/Output from previous day: No intake or output data in the 24 hours ending 03/24/11 0953   Physical Exam:  Gen:  NAD Cardiovascular:  RRR, No M/R/G Respiratory: Lungs CTAB Gastrointestinal: Abdomen softly distended, NT/ND with normal active bowel sounds. Extremities: No C/E/C     Lab Results: Basic Metabolic Panel:  Lab 03/24/11 1610 03/23/11 0845  NA 134* 132*  K 4.2 4.3  CL 100 97  CO2 29 23  GLUCOSE 100* 138*  BUN 8 9  CREATININE 0.85 0.70  CALCIUM 8.5 9.1  MG -- --  PHOS -- --   GFR Estimated Creatinine Clearance: 141.7 ml/min (by C-G formula based on Cr of 0.85). Liver Function Tests:  Lab 03/23/11 0845  AST 72*  ALT 91*  ALKPHOS 127*  BILITOT 1.2  PROT 6.9  ALBUMIN 3.4*    Lab 03/23/11 2000  LIPASE 34  AMYLASE --    CBC:  Lab 03/24/11 0336 03/23/11 0845  WBC 7.7 11.5*  NEUTROABS -- 7.0  HGB 14.5 19.3*  HCT 41.3 53.6*  MCV 101.7* 98.2  PLT 168 227    Studies/Results: Ct Abdomen Pelvis Wo  Contrast  03/23/2011  IMPRESSION:  1.  No nephrolithiasis.  No hydronephrosis or hydroureter.  No calcified ureteral calculi are noted.  2. There is small amount of peri splenic ascites.  Ascites noted bilateral paracolic gutters.  Small amount of ascites noted in the right lower quadrant.  Free fluid is noted inferior and adjacent to distal duodenum.  There is focal thickening of distal duodenal wall.  Findings are suspicious for focal enteritis, focal small bowel inflammation or ulcer disease.  Clinical correlation is necessary.  Further evaluation with CT scan with IV and oral contrast is recommended.  3.  Colonic diverticula are noted right colon distal left colon and sigmoid colon without definite evidence of acute diverticulitis. 4.  Unremarkable distal ureter bilaterally.  Unremarkable urinary bladder. 5.  Fatty infiltration of the liver.  Original Report Authenticated By: Natasha Mead, M.D.   Ct Abdomen Pelvis W Contrast  03/23/2011   IMPRESSION: Extensive colonic diverticulosis.  Possible diverticulitis in the right upper abdomen, equivocal.  No drainable fluid collection or abscess.  No free air.  Trace bilateral pleural effusions and small volume abdominopelvic ascites/mesenteric stranding.  Hepatic steatosis.  Bladder is underdistended.  Original Report Authenticated By: Charline Bills, M.D.    Medications: Scheduled Meds:    . ciprofloxacin  400 mg Intravenous Once  . ciprofloxacin  400  mg Intravenous Q12H  . metronidazole  500 mg Intravenous Once  . metronidazole  500 mg Intravenous Q8H  . morphine      . morphine      . ondansetron       Continuous Infusions:    . 0.9 % NaCl with KCl 20 mEq / L 100 mL/hr at 03/24/11 0847   PRN Meds:.iohexol, LORazepam, morphine, ondansetron (ZOFRAN) IV, ondansetron Antibiotics: Anti-infectives     Start     Dose/Rate Route Frequency Ordered Stop   03/23/11 2030   ciprofloxacin (CIPRO) IVPB 400 mg        400 mg 200 mL/hr over 60 Minutes  Intravenous Every 12 hours 03/23/11 1859     03/23/11 2030   metroNIDAZOLE (FLAGYL) IVPB 500 mg        500 mg 100 mL/hr over 60 Minutes Intravenous Every 8 hours 03/23/11 1859     03/23/11 1200   ciprofloxacin (CIPRO) IVPB 400 mg        400 mg 200 mL/hr over 60 Minutes Intravenous  Once 03/23/11 1156 03/23/11 1403   03/23/11 1200   metroNIDAZOLE (FLAGYL) IVPB 500 mg        500 mg 100 mL/hr over 60 Minutes Intravenous  Once 03/23/11 1156 03/23/11 1328           Assessment/Plan:  Principal Problem:  *Diverticulitis /  Abdominal pain, RLQ (right lower quadrant) The patient was admitted and a diagnostic work up was done which confirmed acute diverticulitis.  He was placed on bowel rest and put on Cipro/Flagyl with almost complete resolution of his abdominal pain within 24 hours.  We will continue IV Cipro/Flagyl and slowly advance diet. Active Problems:  OSA (obstructive sleep apnea) CPAP Q HS has been ordered.  UTI (lower urinary tract infection) Urinalysis showed many bacteria.  Urine cultures are pending.  He is on Cipro for diverticulitis, which should cover urinary pathogens.  ETOH abuse We will watch for signs of withdrawal.  Supplement with thiamine and folic acid.  Ativan PRN.  Hyponatremia Likely secondary to "beer drinkers potomania".  Gently hydrate and monitor.  Alcoholic hepatitis LFT elevation likely from alcohol induced hepatitis.  Monitor.    LOS: 1 day   Hillery Aldo, MD Pager [phone]  03/24/2011, 9:53 AM

## 2011-03-24 NOTE — Progress Notes (Signed)
Pt has eraser-sized blood clot in boxer shorts from a bowel movement. States he has has similar issues before due to hemorrhoids. Advised pt that would leave a note for doctor but for pt to let nursing staff know immediately if the quantity/severity of bleeding increased.

## 2011-03-25 ENCOUNTER — Encounter (HOSPITAL_COMMUNITY): Payer: Self-pay | Admitting: Internal Medicine

## 2011-03-25 DIAGNOSIS — K649 Unspecified hemorrhoids: Secondary | ICD-10-CM | POA: Diagnosis present

## 2011-03-25 DIAGNOSIS — K760 Fatty (change of) liver, not elsewhere classified: Secondary | ICD-10-CM

## 2011-03-25 HISTORY — DX: Fatty (change of) liver, not elsewhere classified: K76.0

## 2011-03-25 LAB — COMPREHENSIVE METABOLIC PANEL
AST: 70 U/L — ABNORMAL HIGH (ref 0–37)
Albumin: 2.8 g/dL — ABNORMAL LOW (ref 3.5–5.2)
Chloride: 102 mEq/L (ref 96–112)
Creatinine, Ser: 0.72 mg/dL (ref 0.50–1.35)
Total Bilirubin: 0.8 mg/dL (ref 0.3–1.2)
Total Protein: 5.9 g/dL — ABNORMAL LOW (ref 6.0–8.3)

## 2011-03-25 LAB — CBC
HCT: 40.5 % (ref 39.0–52.0)
Hemoglobin: 13.9 g/dL (ref 13.0–17.0)
MCH: 35 pg — ABNORMAL HIGH (ref 26.0–34.0)
MCV: 102 fL — ABNORMAL HIGH (ref 78.0–100.0)
Platelets: 147 10*3/uL — ABNORMAL LOW (ref 150–400)
RBC: 3.97 MIL/uL — ABNORMAL LOW (ref 4.22–5.81)

## 2011-03-25 MED ORDER — METRONIDAZOLE 500 MG PO TABS
500.0000 mg | ORAL_TABLET | Freq: Three times a day (TID) | ORAL | Status: DC
  Administered 2011-03-25: 500 mg via ORAL
  Filled 2011-03-25 (×4): qty 1

## 2011-03-25 MED ORDER — CIPROFLOXACIN HCL 500 MG PO TABS
500.0000 mg | ORAL_TABLET | Freq: Two times a day (BID) | ORAL | Status: DC
  Administered 2011-03-25: 500 mg via ORAL
  Filled 2011-03-25 (×2): qty 1

## 2011-03-25 MED ORDER — OXYCODONE HCL 5 MG PO CAPS: 5.0000 mg | ORAL_CAPSULE | ORAL | Status: AC | PRN

## 2011-03-25 MED ORDER — CIPROFLOXACIN HCL 500 MG PO TABS: 500.0000 mg | ORAL_TABLET | Freq: Two times a day (BID) | ORAL | Status: DC

## 2011-03-25 MED ORDER — CIPROFLOXACIN HCL 500 MG PO TABS: 500.0000 mg | ORAL_TABLET | Freq: Two times a day (BID) | ORAL | Status: AC

## 2011-03-25 MED ORDER — HYDROCORTISONE ACETATE 25 MG RE SUPP: 25.0000 mg | Freq: Two times a day (BID) | RECTAL | Status: AC

## 2011-03-25 MED ORDER — HYDROCORTISONE ACETATE 25 MG RE SUPP: 25.0000 mg | Freq: Two times a day (BID) | RECTAL | Status: DC

## 2011-03-25 MED ORDER — METRONIDAZOLE 500 MG PO TABS: 500.0000 mg | ORAL_TABLET | Freq: Three times a day (TID) | ORAL | Status: DC

## 2011-03-25 MED ORDER — METRONIDAZOLE 500 MG PO TABS: 500.0000 mg | ORAL_TABLET | Freq: Three times a day (TID) | ORAL | Status: AC

## 2011-03-25 NOTE — Progress Notes (Signed)
Discharge instructions reviewed and prescriptions given to patient. Patient has no questions or concerns at time.

## 2011-03-25 NOTE — Discharge Summary (Signed)
Physician Discharge Summary  Patient ID: Ricky Molina MRN: [id number] DOB/AGE: [birth year] 46 y.o.  Admit date: 03/23/2011 Discharge date: 03/25/2011  Primary Care Physician:  Ricky Ora, MD, MD   Discharge Diagnoses:    Present on Admission:  .Abdominal pain, RLQ (right lower quadrant) .Diverticulitis .UTI (lower urinary tract infection) .ETOH abuse .OSA (obstructive sleep apnea) .Hyponatremia .Alcoholic hepatitis .Alcoholism .Hemorrhoids .Hepatic steatosis  Discharge Medications:  Medication List  As of 03/25/2011  8:35 AM   TAKE these medications         calcium carbonate 500 MG chewable tablet   Commonly known as: TUMS - dosed in mg elemental calcium   Chew 1 tablet by mouth daily as needed. For antacid.      ciprofloxacin 500 MG tablet   Commonly known as: CIPRO   Take 1 tablet (500 mg total) by mouth 2 (two) times daily.      hydrocortisone 25 MG suppository   Commonly known as: ANUSOL-HC   Place 1 suppository (25 mg total) rectally 2 (two) times daily.      ibuprofen 200 MG tablet   Commonly known as: ADVIL,MOTRIN   Take 400-800 mg by mouth every 8 (eight) hours as needed. For pain.      metroNIDAZOLE 500 MG tablet   Commonly known as: FLAGYL   Take 1 tablet (500 mg total) by mouth 3 (three) times daily.      oxycodone 5 MG capsule   Commonly known as: OXY-IR   Take 1 capsule (5 mg total) by mouth every 4 (four) hours as needed for pain.             Disposition and Follow-up: The patient is being discharged home.  He has follow up scheduled with Dr. Drue Molina on 04/12/11.   Significant Diagnostic Studies:  Ct Abdomen Pelvis Wo Contrast  03/23/2011   IMPRESSION:  1.  No nephrolithiasis.  No hydronephrosis or hydroureter.  No calcified ureteral calculi are noted.  2. There is small amount of peri splenic ascites.  Ascites noted bilateral paracolic gutters.  Small amount of ascites noted in the right lower quadrant.  Free fluid is noted inferior and adjacent  to distal duodenum.  There is focal thickening of distal duodenal wall.  Findings are suspicious for focal enteritis, focal small bowel inflammation or ulcer disease.  Clinical correlation is necessary.  Further evaluation with CT scan with IV and oral contrast is recommended.  3.  Colonic diverticula are noted right colon distal left colon and sigmoid colon without definite evidence of acute diverticulitis. 4.  Unremarkable distal ureter bilaterally.  Unremarkable urinary bladder. 5.  Fatty infiltration of the liver.  Original Report Authenticated By: Ricky Molina, M.D.   Ct Abdomen Pelvis W Contrast  03/23/2011   IMPRESSION: Extensive colonic diverticulosis.  Possible diverticulitis in the right upper abdomen, equivocal.  No drainable fluid collection or abscess.  No free air.  Trace bilateral pleural effusions and small volume abdominopelvic ascites/mesenteric stranding.  Hepatic steatosis.  Bladder is underdistended.  Original Report Authenticated By: Ricky Molina, M.D.    Discharge Laboratory Values: Basic Metabolic Panel:  Lab 03/25/11 4098 03/24/11 0336 03/23/11 0845  NA 135 134* 132*  K 4.2 4.2 --  CL 102 100 97  CO2 27 29 23   GLUCOSE 93 100* 138*  BUN 6 8 9   CREATININE 0.72 0.85 0.70  CALCIUM 8.6 8.5 9.1  MG -- -- --  PHOS -- -- --   GFR Estimated Creatinine Clearance: 150.6 ml/min (by  C-G formula based on Cr of 0.72). Liver Function Tests:  Lab 03/25/11 0354 03/23/11 0845  AST 70* 72*  ALT 67* 91*  ALKPHOS 90 127*  BILITOT 0.8 1.2  PROT 5.9* 6.9  ALBUMIN 2.8* 3.4*    Lab 03/23/11 2000  LIPASE 34  AMYLASE --    CBC:  Lab 03/25/11 0354 03/24/11 0336 03/23/11 0845  WBC 6.1 7.7 11.5*  NEUTROABS -- -- 7.0  HGB 13.9 14.5 19.3*  HCT 40.5 41.3 53.6*  MCV 102.0* 101.7* 98.2  PLT 147* 168 227   Microbiology Recent Results (from the past 240 hour(s))  URINE CULTURE     Status: Normal   Collection Time   03/23/11 12:07 PM      Component Value Range Status Comment     Specimen Description URINE, CLEAN CATCH   Final    Special Requests NONE   Final    Culture  Setup Time [id number]   Final    Colony Count NO GROWTH   Final    Culture NO GROWTH   Final    Report Status 03/24/2011 FINAL   Final      Brief H and P: For complete details please refer to admission H and P, but in brief, Mr. Ricky Molina is a 46 year old male with a PMH of alcoholism who presented to the hospital on 03/23/11 with abdominal pain. A CT scan confirmed diverticulitis.   Physical Exam at Discharge: BP 126/79  Pulse 81  Temp(Src) 97.1 F (36.2 C) (Oral)  Resp 20  Ht 5\' 9"  (1.753 m)  Wt 122.2 kg (269 lb 6.4 oz)  BMI 39.78 kg/m2  SpO2 95% Gen:  NAD Cardiovascular:  RRR, No M/R/G Respiratory: Lungs CTAB Gastrointestinal: Abdomen softly distended, NT/ND with normal active bowel sounds. Extremities: No C/E/C   Hospital Course:  Principal Problem:  *Diverticulitis / Abdominal pain, RLQ (right lower quadrant)  The patient was admitted and a diagnostic work up was done which confirmed acute diverticulitis. He was placed on bowel rest and put on Cipro/Flagyl with almost complete resolution of his abdominal pain within 24 hours. He has tolerated advancement of his diet, and is anxious for discharge. We will d/c him on a 10 day course of oral therapy with Cipro/Flagyl.  He has been advised to avoid all alcohol while on Flagyl. Active Problems:  OSA (obstructive sleep apnea)  Continue home CPAP Q HS. UTI (lower urinary tract infection)  Urinalysis showed many bacteria. Urine cultures were negative. He is on Cipro for diverticulitis, which should cover urinary pathogens.  ETOH abuse  No signs of withdrawal while in the hospital.  He was counseled regarding cessation. We also supplemented him with thiamine and folic acid.  Hyponatremia  Likely secondary to "beer drinkers potomania". Resolved with hydration.  Alcoholic hepatitis  LFT elevation likely from alcohol induced hepatitis.  LFTs trended down with alcohol cessation  Hemorrhoids The patient was given a prescription for Anusol suppositories.  Hepatic steatosis Counseled on ETOH cessation and weight loss.   Diet:  Heart healthy  Activity:  Increase activity slowly  Condition at Discharge:   Improved  Time spent on Discharge:  35 minutes  Signed: Dr. Trula Ore Cynthis Purington Pager [phone] 03/25/2011, 8:35 AM   /

## 2011-03-27 NOTE — Progress Notes (Signed)
UR completed 

## 2011-04-12 ENCOUNTER — Encounter: Payer: Self-pay | Admitting: Internal Medicine

## 2011-04-12 ENCOUNTER — Ambulatory Visit (INDEPENDENT_AMBULATORY_CARE_PROVIDER_SITE_OTHER): Payer: PRIVATE HEALTH INSURANCE | Admitting: Internal Medicine

## 2011-04-12 DIAGNOSIS — Z0000 Encounter for general adult medical examination without abnormal findings: Secondary | ICD-10-CM

## 2011-04-12 DIAGNOSIS — F1010 Alcohol abuse, uncomplicated: Secondary | ICD-10-CM

## 2011-04-12 DIAGNOSIS — K5792 Diverticulitis of intestine, part unspecified, without perforation or abscess without bleeding: Secondary | ICD-10-CM

## 2011-04-12 DIAGNOSIS — G4733 Obstructive sleep apnea (adult) (pediatric): Secondary | ICD-10-CM

## 2011-04-12 DIAGNOSIS — Z23 Encounter for immunization: Secondary | ICD-10-CM

## 2011-04-12 DIAGNOSIS — K760 Fatty (change of) liver, not elsewhere classified: Secondary | ICD-10-CM

## 2011-04-12 DIAGNOSIS — K7689 Other specified diseases of liver: Secondary | ICD-10-CM

## 2011-04-12 LAB — COMPREHENSIVE METABOLIC PANEL
BUN: 9 mg/dL (ref 6–23)
CO2: 27 mEq/L (ref 19–32)
Creatinine, Ser: 0.7 mg/dL (ref 0.4–1.5)
GFR: 123.01 mL/min (ref >60.00)
Glucose, Bld: 95 mg/dL (ref 70–99)
Total Bilirubin: 0.6 mg/dL (ref 0.3–1.2)

## 2011-04-12 LAB — LIPID PANEL
Cholesterol: 241 mg/dL — ABNORMAL HIGH (ref 0–200)
HDL: 53.6 mg/dL (ref >39.00)
Triglycerides: 137 mg/dL (ref 0.0–149.0)
VLDL: 27.4 mg/dL (ref 0.0–40.0)

## 2011-04-12 LAB — CBC WITH DIFFERENTIAL/PLATELET
Eosinophils Absolute: 0.1 10*3/uL (ref 0.0–0.7)
HCT: 48.1 % (ref 39.0–52.0)
Lymphs Abs: 2.2 10*3/uL (ref 0.7–4.0)
MCHC: 33.8 g/dL (ref 30.0–36.0)
MCV: 103 fl — ABNORMAL HIGH (ref 78.0–100.0)
Monocytes Absolute: 0.6 10*3/uL (ref 0.1–1.0)
Neutrophils Relative %: 35 % — ABNORMAL LOW (ref 43.0–77.0)
Platelets: 264 10*3/uL (ref 150.0–400.0)

## 2011-04-12 LAB — LDL CHOLESTEROL, DIRECT: Direct LDL: 167 mg/dL

## 2011-04-12 LAB — VITAMIN B12: Vitamin B-12: 337 pg/mL (ref 211–911)

## 2011-04-12 LAB — FOLATE: Folate: 9.5 ng/mL (ref >5.9)

## 2011-04-12 NOTE — Assessment & Plan Note (Signed)
Resolved, never had a cscope--- referral

## 2011-04-12 NOTE — Progress Notes (Signed)
  Subjective:    Patient ID: Ricky Molina, male    DOB: 05-24-1965, 46 y.o.   MRN: [id number]  HPI New patient , CPX S/P a recent admission with abdominal and back pain. Diagnosed with diverticulitis, CT also showed fatty liver and a ascites. He was treated in the standard fashion, responded well, was released home. Currently abdominal pain is resolved, he quit alcohol. He still has some degree of back pain, mostly at the right side of T 12, worse with certain movements. Compared to few weeks ago it is better. No radiation.  Past medical history Hyperlipidemia  Elevated BP OSA , on CPAP ETOH abuse Hepatis steatosis, Hepatitis 03-2011: d/t ETOH, mild ascites per CT   Diverticulitis 03-2011 (first episode)  Past surgical history Appendectomy 1998  Social history Married, 2 children Tobacco-- stopped 01-2011 ETOH-- heavy up until 2-13  Drugs, denies, specifically no IVD Job-- Curator    Family history Diabetes--F HTN-- F CAD-- F MI in his 15s, CHF father  Stroke-- F Colon cancer--no Breast cancer-- M Skin cancer-- M Prostate cancer--no   Review of Systems  Constitutional: Negative for fever. Fatigue: fatigue after work.  Respiratory: Negative for cough, shortness of breath and wheezing.   Cardiovascular: Negative for chest pain and leg swelling.  Gastrointestinal: Negative for abdominal pain and blood in stool.       GERD better since he quit ETOH  Genitourinary: Negative for dysuria, hematuria and difficulty urinating.  Psychiatric/Behavioral:       No depression or anxiety        Objective:   Physical Exam  Constitutional: He is oriented to person, place, and time. He appears well-developed. No distress.       Central obesity  Neck: No thyromegaly present.  Cardiovascular: Normal rate, regular rhythm and normal heart sounds.   No murmur heard. Pulmonary/Chest: Effort normal and breath sounds normal. No respiratory distress. He has no wheezes. He has no  rales.  Abdominal: Soft. He exhibits no distension. There is no tenderness. There is no rebound and no guarding.       No HSM  Musculoskeletal: He exhibits no edema.  Neurological: He is alert and oriented to person, place, and time.  Skin: Skin is warm and dry. He is not diaphoretic.  Psychiatric: He has a normal mood and affect. His behavior is normal. Judgment and thought content normal.      Assessment & Plan:

## 2011-04-12 NOTE — Assessment & Plan Note (Signed)
Recent CT showed fatty liver and a small amount of ascitis LTFs, PT PTT reasses via u/s in 6 months, consider further w/u if LFTs not normalizing

## 2011-04-12 NOTE — Assessment & Plan Note (Addendum)
Td  today Diet-exercise discussed Labs Cscope

## 2011-04-12 NOTE — Assessment & Plan Note (Addendum)
Quit last month, praised, wife supportive. He is still at risk or relapse, rec AA Consequences of ETOH discussed  Check vitamines

## 2011-04-12 NOTE — Assessment & Plan Note (Addendum)
Continue w/ CPAP, settings adjusted few months ago per pt

## 2011-04-13 ENCOUNTER — Encounter: Payer: Self-pay | Admitting: Internal Medicine

## 2011-04-16 ENCOUNTER — Encounter: Payer: Self-pay | Admitting: *Deleted

## 2011-04-16 ENCOUNTER — Encounter: Payer: Self-pay | Admitting: Internal Medicine

## 2011-04-19 ENCOUNTER — Ambulatory Visit: Payer: PRIVATE HEALTH INSURANCE | Admitting: Internal Medicine

## 2011-05-18 ENCOUNTER — Other Ambulatory Visit: Payer: Self-pay | Admitting: Internal Medicine

## 2011-05-18 ENCOUNTER — Other Ambulatory Visit (INDEPENDENT_AMBULATORY_CARE_PROVIDER_SITE_OTHER): Payer: PRIVATE HEALTH INSURANCE

## 2011-05-18 ENCOUNTER — Ambulatory Visit (INDEPENDENT_AMBULATORY_CARE_PROVIDER_SITE_OTHER): Payer: PRIVATE HEALTH INSURANCE | Admitting: Internal Medicine

## 2011-05-18 ENCOUNTER — Encounter: Payer: Self-pay | Admitting: Internal Medicine

## 2011-05-18 VITALS — BP 120/68 | HR 100 | Ht 69.0 in | Wt 259.8 lb

## 2011-05-18 DIAGNOSIS — K5792 Diverticulitis of intestine, part unspecified, without perforation or abscess without bleeding: Secondary | ICD-10-CM

## 2011-05-18 DIAGNOSIS — K7010 Alcoholic hepatitis without ascites: Secondary | ICD-10-CM

## 2011-05-18 DIAGNOSIS — K5732 Diverticulitis of large intestine without perforation or abscess without bleeding: Secondary | ICD-10-CM

## 2011-05-18 LAB — HEPATIC FUNCTION PANEL
AST: 32 U/L (ref 0–37)
Albumin: 4.1 g/dL (ref 3.5–5.2)
Alkaline Phosphatase: 80 U/L (ref 39–117)
Bilirubin, Direct: 0.1 mg/dL (ref 0.0–0.3)
Total Protein: 7.5 g/dL (ref 6.0–8.3)

## 2011-05-18 NOTE — Progress Notes (Signed)
Subjective:    Patient ID: Ricky Molina, male    DOB: [birth year], 46 y.o.   MRN: [id number]  Referring physician: Wanda Plump, MD [phone] [facility] [address] [place], [place] [address]  HPI This 46 year old white man is here for evaluation of her recent diverticulitis, and to consider colonoscopy. He recently became a new patient of Dr.Paz's after he had been hospitalized, in February. The patient developed abdominal pain in the right side, fairly acute syndrome. He presented to the hospital where a CT scan of the abdomen and pelvis showed changes of possible diverticulitis in the right colon, the radiologist thought that the findings were possible and equivocal. There was also a small amount of ascites and stranding in the mesentery consistent with edematous mesentery. He had abnormal liver function tests. He was using alcohol regularly and in somewhat heavy fashion. He had fatty liver on CT scan. Prior to this he had stopped smoking, and then after this hospitalization he stopped drinking.  While hospitalized he became significantly improved in pain resolved in about 24 hours, he was treated with Cipro and metronidazole. At this point he feels fine with regular bowel movements, he has not had any bleeding, nausea or vomiting and there is no abdominal pain at this time. He remains abstinent from alcohol. His father has a history of alcoholism and the patient is aware of the complications that may result, and is committed to abstinence.  Medications, allergies, past medical history, past surgical history, family history and social history are reviewed and updated in the EMR.   Review of Systems Positive for arthritis, back pain knee pain. He has sleep apnea and uses CPAP. He has some allergy and sinus difficulties. All other review of systems negative or as per history of present illness.    Objective:   Physical Exam General:  Well-developed, well-nourished and in no acute distress  - obese Eyes:  anicteric. ENT:   Mouth and posterior pharynx free of lesions.  Neck:   supple w/o thyromegaly or mass.  Lungs: Clear to auscultation bilaterally. Heart:  S1S2, no rubs, murmurs, gallops. Abdomen:  soft, non-tender, no hepatosplenomegaly, hernia, or mass and BS+.  Lymph:  no cervical or supraclavicular adenopathy. Extremities:   no edema Skin   no rash. + tattoos Neuro:  A&O x 3.  Psych:  appropriate mood and  Affect.   Data Reviewed:  Lab Results  Component Value Date   WBC 4.5 04/12/2011   HGB 16.3 04/12/2011   HCT 48.1 04/12/2011   MCV 103.0* 04/12/2011   PLT 264.0 04/12/2011     Chemistry      Component Value Date/Time   NA 141 04/12/2011 0901   K 4.2 04/12/2011 0901   CL 103 04/12/2011 0901   CO2 27 04/12/2011 0901   BUN 9 04/12/2011 0901   CREATININE 0.7 04/12/2011 0901      Component Value Date/Time   CALCIUM 9.9 04/12/2011 0901   ALKPHOS 83 04/12/2011 0901   AST 55* 04/12/2011 0901   ALT 92* 04/12/2011 0901   BILITOT 0.6 04/12/2011 0901       CT abdomen and pelvis with contrast, 03/23/2011 IMPRESSION:  Extensive colonic diverticulosis. Possible diverticulitis in the  right upper abdomen, equivocal. No drainable fluid collection or  abscess. No free air.  Trace bilateral pleural effusions and small volume abdominopelvic  ascites/mesenteric stranding.  Hepatic steatosis.  Bladder is underdistended.       Assessment & Plan:   1. Alcoholic  hepatitis   2. ? Diverticulitis   3. Nonspecific elevation of levels of transaminase or lactic acid dehydrogenase (LDH)     I am not convinced his problems were from diverticulitis. Have explained this to him, I reviewed his CT images with him. I think he may have had some ascites and mesenteric congestion related to alcoholic hepatitis the cause his problems, or perhaps some other type of gastrointestinal infection. He recovered extremely quickly with complete resolution of pain within 24 hours.

## 2011-05-18 NOTE — Assessment & Plan Note (Addendum)
Clinical scenario atypical for this I think and radiologic findings equivocal. ? Other GI infection or sequellae of alcoholic hepatitis. Though colonoscopy is often undertaken after a first episode of diverticulitis, the truth is it is extremely rare to find any other underlying pathology unless there is some sort of suggestion of a mass lesion. The diagnosis here was equivocal according to the radiologist in the clinical scenario and was not entirely consistent with that. I have explained this to the patient. Because of this we have decided not to pursue a colonoscopy at this time. I have explained that there is a very rare chance of some underlying significant problem like a neoplasm or cancer but that seems extremely unlikely and he is comfortable with this approach. I plan to see him back in about 2 months to reassess things and to recheck labs today.

## 2011-05-18 NOTE — Assessment & Plan Note (Addendum)
Off EtOH his LFTs are improving. This may have been cause of his problems and not diverticulitis as thought. It can cause right-sided abdominal pain, and the congestion in the mesentery and ascites go along with this as well as possibly thickened wall of the right colon which could mimic diverticulitis.

## 2011-05-18 NOTE — Patient Instructions (Signed)
Your physician has requested that you go to the basement for the following lab work before leaving today: Liver function test and several Hepatitis test.  Keep up the good work, continue not smoking or drinking.  Follow-Up with [place] in 2 months.

## 2011-05-19 LAB — HEPATITIS A ANTIBODY, TOTAL: Hep A Total Ab: NEGATIVE

## 2011-05-21 NOTE — Progress Notes (Signed)
Quick Note:  LFT's back to normal and the infectious hepatitis studies are all ok Stick with plan to see me again in 2 months and stay off alcohol ______

## 2011-07-13 ENCOUNTER — Ambulatory Visit: Payer: PRIVATE HEALTH INSURANCE | Admitting: Internal Medicine

## 2011-08-03 ENCOUNTER — Ambulatory Visit: Payer: PRIVATE HEALTH INSURANCE | Admitting: Internal Medicine

## 2011-09-07 ENCOUNTER — Ambulatory Visit: Payer: PRIVATE HEALTH INSURANCE | Admitting: Internal Medicine

## 2011-12-10 ENCOUNTER — Emergency Department (INDEPENDENT_AMBULATORY_CARE_PROVIDER_SITE_OTHER)
Admission: EM | Admit: 2011-12-10 | Discharge: 2011-12-10 | Disposition: A | Discharge status: Nursing Home (Includes ICF) | Payer: PRIVATE HEALTH INSURANCE | Source: Home / Self Care | Attending: Family Medicine | Admitting: Family Medicine

## 2011-12-10 ENCOUNTER — Encounter (HOSPITAL_COMMUNITY): Payer: Self-pay | Admitting: *Deleted

## 2011-12-10 ENCOUNTER — Encounter (HOSPITAL_COMMUNITY): Payer: Self-pay

## 2011-12-10 DIAGNOSIS — Z791 Long term (current) use of non-steroidal anti-inflammatories (NSAID): Secondary | ICD-10-CM | POA: Insufficient documentation

## 2011-12-10 DIAGNOSIS — E785 Hyperlipidemia, unspecified: Secondary | ICD-10-CM | POA: Insufficient documentation

## 2011-12-10 DIAGNOSIS — K7010 Alcoholic hepatitis without ascites: Secondary | ICD-10-CM

## 2011-12-10 DIAGNOSIS — K5732 Diverticulitis of large intestine without perforation or abscess without bleeding: Secondary | ICD-10-CM | POA: Insufficient documentation

## 2011-12-10 DIAGNOSIS — R7402 Elevation of levels of lactic acid dehydrogenase (LDH): Secondary | ICD-10-CM | POA: Insufficient documentation

## 2011-12-10 DIAGNOSIS — F1020 Alcohol dependence, uncomplicated: Secondary | ICD-10-CM | POA: Insufficient documentation

## 2011-12-10 DIAGNOSIS — G4733 Obstructive sleep apnea (adult) (pediatric): Secondary | ICD-10-CM | POA: Insufficient documentation

## 2011-12-10 DIAGNOSIS — Z87891 Personal history of nicotine dependence: Secondary | ICD-10-CM | POA: Insufficient documentation

## 2011-12-10 DIAGNOSIS — Z87898 Personal history of other specified conditions: Secondary | ICD-10-CM | POA: Insufficient documentation

## 2011-12-10 DIAGNOSIS — R7401 Elevation of levels of liver transaminase levels: Secondary | ICD-10-CM | POA: Insufficient documentation

## 2011-12-10 DIAGNOSIS — K7011 Alcoholic hepatitis with ascites: Secondary | ICD-10-CM

## 2011-12-10 LAB — URINALYSIS, ROUTINE W REFLEX MICROSCOPIC
Bilirubin Urine: NEGATIVE
Hgb urine dipstick: NEGATIVE
Nitrite: NEGATIVE
Specific Gravity, Urine: 1.022 (ref 1.005–1.030)
pH: 6 (ref 5.0–8.0)

## 2011-12-10 NOTE — ED Notes (Signed)
Low back pain, sx started Friday

## 2011-12-10 NOTE — ED Notes (Signed)
Pt states lower back pain for the past 3-4 days. Pt denies injury to back, no lifting heavy objects, turning, bending or known cause for pain. Pt states similar syptoms 6 months ago. Pt states doesn't feel like muscle or nerve but inside his back that hurt. Most of the time pain is right lower back

## 2011-12-10 NOTE — ED Provider Notes (Signed)
History     CSN: [id number]  Arrival date & time 12/10/11  8295   First MD Initiated Contact with Patient 12/10/11 1921      Chief Complaint  Patient presents with  . Back Pain    (Consider location/radiation/quality/duration/timing/severity/associated sxs/prior treatment) Patient is a 46 y.o. male presenting with back pain. The history is provided by the patient.  Back Pain  This is a recurrent problem. The current episode started more than 2 days ago (similar to episode 6 mo ago when admitted for etoh abuse, he is consuming heavily again for sev mos.). The problem has been gradually worsening. The pain is associated with no known injury. The pain is present in the lumbar spine. Pertinent negatives include no chest pain.    Past Medical History  Diagnosis Date  . Hyperlipidemia   . Allergic rhinitis   . OSA on CPAP   . Diverticulitis 03/23/2011    possible  . Alcoholic hepatitis 03/24/2011  . Alcoholism 03/24/2011  . Hepatic steatosis 03/25/2011  . Hemorrhoids 03/25/2011  . Ascites     mild on CT 03-23-11  . Nonspecific elevation of levels of transaminase or lactic acid dehydrogenase (LDH)     Past Surgical History  Procedure Date  . Appendectomy 1997    Family History  Problem Relation Age of Onset  . Emphysema Mother   . Hypertension Mother   . Skin cancer Mother     nose  . Breast cancer Mother   . Alcohol abuse Mother   . Breast cancer Mother   . Allergies Sister   . Heart failure Father   . Stroke Father   . Rheum arthritis Father   . Diabetes Father   . Alcohol abuse Father   . Arthritis Father   . Hyperlipidemia Father   . Heart disease Father   . Hypertension Father   . Hypothyroidism Sister     History  Substance Use Topics  . Smoking status: Former Smoker -- 1.0 packs/day for 30 years    Types: Cigarettes    Quit date: 01/20/2011  . Smokeless tobacco: Never Used  . Alcohol Use: 25.2 oz/week    42 Cans of beer per week      Review of  Systems  Constitutional: Negative.   Cardiovascular: Negative for chest pain.  Gastrointestinal: Positive for abdominal distention.  Musculoskeletal: Positive for back pain.  Skin: Negative.     Allergies  Morphine and related  Home Medications   Current Outpatient Rx  Name  Route  Sig  Dispense  Refill  . IBUPROFEN 200 MG PO TABS   Oral   Take 400-800 mg by mouth every 8 (eight) hours as needed. For pain.         Marland Kitchen CALCIUM CARBONATE ANTACID 500 MG PO CHEW   Oral   Chew 1 tablet by mouth daily as needed. For antacid.           BP 145/87  Pulse 110  Temp 98.3 F (36.8 C) (Oral)  Resp 16  SpO2 97%  Physical Exam  Nursing note and vitals reviewed. Constitutional: He appears well-nourished.  Abdominal: He exhibits shifting dullness, distension and ascites. Bowel sounds are decreased. There is hepatomegaly. There is tenderness in the right upper quadrant.    ED Course  Procedures (including critical care time)  Labs Reviewed - No data to display No results found.   1. Alcoholic hepatitis with ascites       MDM  Linna Hoff, MD 12/10/11 2105

## 2011-12-11 ENCOUNTER — Emergency Department (HOSPITAL_COMMUNITY): Payer: PRIVATE HEALTH INSURANCE

## 2011-12-11 ENCOUNTER — Emergency Department (HOSPITAL_COMMUNITY)
Admission: EM | Admit: 2011-12-11 | Discharge: 2011-12-11 | Disposition: A | Discharge status: Home / Self Care | Payer: PRIVATE HEALTH INSURANCE | Attending: Emergency Medicine | Admitting: Emergency Medicine

## 2011-12-11 DIAGNOSIS — F1020 Alcohol dependence, uncomplicated: Secondary | ICD-10-CM

## 2011-12-11 DIAGNOSIS — M549 Dorsalgia, unspecified: Secondary | ICD-10-CM

## 2011-12-11 LAB — COMPREHENSIVE METABOLIC PANEL
AST: 124 U/L — ABNORMAL HIGH (ref 0–37)
CO2: 23 mEq/L (ref 19–32)
Calcium: 9.6 mg/dL (ref 8.4–10.5)
Creatinine, Ser: 0.7 mg/dL (ref 0.50–1.35)
GFR calc Af Amer: >90 mL/min (ref >90)
GFR calc non Af Amer: >90 mL/min (ref >90)
Sodium: 138 mEq/L (ref 135–145)
Total Protein: 7.5 g/dL (ref 6.0–8.3)

## 2011-12-11 LAB — CBC WITH DIFFERENTIAL/PLATELET
HCT: 47.1 % (ref 39.0–52.0)
Hemoglobin: 16.7 g/dL (ref 13.0–17.0)
Lymphs Abs: 3.4 10*3/uL (ref 0.7–4.0)
MCH: 35.8 pg — ABNORMAL HIGH (ref 26.0–34.0)
Monocytes Absolute: 0.9 10*3/uL (ref 0.1–1.0)
Monocytes Relative: 10 % (ref 3–12)
Neutro Abs: 4.4 10*3/uL (ref 1.7–7.7)
Neutrophils Relative %: 50 % (ref 43–77)
RBC: 4.67 MIL/uL (ref 4.22–5.81)

## 2011-12-11 MED ORDER — OXYCODONE HCL 5 MG PO TABS: 5.0000 mg | ORAL_TABLET | ORAL | Status: DC | PRN

## 2011-12-11 MED ORDER — HYDROMORPHONE HCL PF 1 MG/ML IJ SOLN
1.0000 mg | Freq: Once | INTRAMUSCULAR | Status: AC
  Administered 2011-12-11: 1 mg via INTRAVENOUS
  Filled 2011-12-11: qty 1

## 2011-12-11 MED ORDER — SODIUM CHLORIDE 0.9 % IV BOLUS (SEPSIS)
1000.0000 mL | Freq: Once | INTRAVENOUS | Status: AC
  Administered 2011-12-11: 1000 mL via INTRAVENOUS

## 2011-12-11 MED ORDER — NAPROXEN 500 MG PO TABS: 500.0000 mg | ORAL_TABLET | Freq: Two times a day (BID) | ORAL | Status: DC

## 2011-12-11 NOTE — ED Provider Notes (Signed)
History     CSN: [id number]  Arrival date & time 12/10/11  2131   First MD Initiated Contact with Patient 12/11/11 0038      Chief Complaint  Patient presents with  . Back Pain    (Consider location/radiation/quality/duration/timing/severity/associated sxs/prior treatment) HPI Comments: Pt with hx of alcohol hepatitis who presents with RLB pain that started several days ago - but today is much worse - worse with movement - severe, no radiation and no associated red flags for pathological pain.  Has hx of alcoholism and has been admitted for alcoholic hepatitis though notes that his liver function returned to normal after several weeks.  Denies abd pain, nasuea, fevers, cough, prior iVDU or CA.  No urianry sx including incontinence or retenetion.  Patient is a 46 y.o. male presenting with back pain. The history is provided by the patient, a relative and medical records.  Back Pain     Past Medical History  Diagnosis Date  . Hyperlipidemia   . Allergic rhinitis   . OSA on CPAP   . Diverticulitis 03/23/2011    possible  . Alcoholic hepatitis 03/24/2011  . Alcoholism 03/24/2011  . Hepatic steatosis 03/25/2011  . Hemorrhoids 03/25/2011  . Ascites     mild on CT 03-23-11  . Nonspecific elevation of levels of transaminase or lactic acid dehydrogenase (LDH)     Past Surgical History  Procedure Date  . Appendectomy 1997    Family History  Problem Relation Age of Onset  . Emphysema Mother   . Hypertension Mother   . Skin cancer Mother     nose  . Breast cancer Mother   . Alcohol abuse Mother   . Breast cancer Mother   . Allergies Sister   . Heart failure Father   . Stroke Father   . Rheum arthritis Father   . Diabetes Father   . Alcohol abuse Father   . Arthritis Father   . Hyperlipidemia Father   . Heart disease Father   . Hypertension Father   . Hypothyroidism Sister     History  Substance Use Topics  . Smoking status: Former Smoker -- 1.0 packs/day for 30 years      Types: Cigarettes    Quit date: 01/20/2011  . Smokeless tobacco: Never Used  . Alcohol Use: 25.2 oz/week    42 Cans of beer per week      Review of Systems  Musculoskeletal: Positive for back pain.  All other systems reviewed and are negative.    Allergies  Morphine and related  Home Medications   Current Outpatient Rx  Name  Route  Sig  Dispense  Refill  . CALCIUM CARBONATE ANTACID 500 MG PO CHEW   Oral   Chew 1 tablet by mouth daily as needed. For antacid.         . IBUPROFEN 200 MG PO TABS   Oral   Take 400-800 mg by mouth every 8 (eight) hours as needed. For pain.         Marland Kitchen NAPROXEN 500 MG PO TABS   Oral   Take 1 tablet (500 mg total) by mouth 2 (two) times daily with a meal.   30 tablet   0   . OXYCODONE HCL 5 MG PO TABS   Oral   Take 1 tablet (5 mg total) by mouth every 4 (four) hours as needed for pain.   10 tablet   0     BP 150/89  Pulse 115  Temp  98.2 F (36.8 C) (Oral)  Resp 20  SpO2 96%  Physical Exam  Nursing note and vitals reviewed. Constitutional: He appears well-developed and well-nourished. No distress.  HENT:  Head: Normocephalic and atraumatic.  Mouth/Throat: Oropharynx is clear and moist. No oropharyngeal exudate.  Eyes: Conjunctivae normal and EOM are normal. Pupils are equal, round, and reactive to light. Right eye exhibits no discharge. Left eye exhibits no discharge. No scleral icterus.  Neck: Normal range of motion. Neck supple. No JVD present. No thyromegaly present.  Cardiovascular: Regular rhythm, normal heart sounds and intact distal pulses.  Exam reveals no gallop and no friction rub.   No murmur heard.      tachycardia  Pulmonary/Chest: Effort normal and breath sounds normal. No respiratory distress. He has no wheezes. He has no rales.  Abdominal: Soft. Bowel sounds are normal. He exhibits no distension and no mass. There is no tenderness.       Pt has obese tense but non tender abd, no anasarca  Musculoskeletal:  Normal range of motion. He exhibits no edema and no tenderness.       Mild reproducible ttp in the RLB, no spinal ttp  Lymphadenopathy:    He has no cervical adenopathy.  Neurological: He is alert. Coordination normal.  Skin: Skin is warm and dry. No rash noted. No erythema.  Psychiatric: He has a normal mood and affect. His behavior is normal.    ED Course  Procedures (including critical care time)  Labs Reviewed  URINALYSIS, ROUTINE W REFLEX MICROSCOPIC - Abnormal; Notable for the following:    Color, Urine AMBER (*)  BIOCHEMICALS MAY BE AFFECTED BY COLOR   Urobilinogen, UA 2.0 (*)     All other components within normal limits  COMPREHENSIVE METABOLIC PANEL - Abnormal; Notable for the following:    Glucose, Bld 113 (*)     AST 124 (*)     ALT 86 (*)     Alkaline Phosphatase 144 (*)     All other components within normal limits  CBC WITH DIFFERENTIAL - Abnormal; Notable for the following:    MCV 100.9 (*)     MCH 35.8 (*)     All other components within normal limits  ETHANOL   Dg Lumbar Spine Complete  12/11/2011  *RADIOLOGY REPORT*  Clinical Data: Lower back pain for 4 days.  LUMBAR SPINE - COMPLETE 4+ VIEW  Comparison: Lumbar spine radiographs performed 12/14/2005, and CT of the abdomen and pelvis performed 03/23/2011  Findings: There is no evidence of fracture or subluxation. Vertebral bodies demonstrate normal height and alignment.  There is slight narrowing of the intervertebral disc spaces along the upper lumbar spine.  Mild lateral osteophytes are noted along the upper lumbar spine.  The visualized neural foramina are grossly unremarkable in appearance.  The visualized bowel gas pattern is unremarkable in appearance; air and stool are noted within the colon.  The sacroiliac joints are within normal limits.  IMPRESSION: No evidence of fracture or subluxation along the lumbar spine.   Original Report Authenticated By: Tonia Ghent, M.D.      1. Back pain   2. Alcoholism     3. Transaminitis       MDM  Pt appears uncomfortable and clearly has pain with maneuvering with position and rotation / flexion.  He has clear lungs and heart is tachycardic.  Check labs including LFT's, lumbar imaging, pain meds.  He has NO abd sx and no ttp.   No abd pain, HR  is now 100, normal BP, normal sat's and normal lung and heart exam, no signs of withdrawal and pain has resolved with one dose of medicines - no neuro defectis and pt states he wants to go home and f/u with AA / detox plan and return to his MD for recheck of LFT's - councelled pt at length re: need to stop alcohol and f/u.  Discharge Prescriptions include:  Naprosyn Roxicodone (10 tabs)      Vida Roller, MD 12/11/11 [phone]

## 2011-12-14 ENCOUNTER — Ambulatory Visit: Payer: PRIVATE HEALTH INSURANCE | Admitting: Pulmonary Disease

## 2012-01-18 ENCOUNTER — Encounter: Payer: Self-pay | Admitting: Pulmonary Disease

## 2012-01-18 ENCOUNTER — Ambulatory Visit (INDEPENDENT_AMBULATORY_CARE_PROVIDER_SITE_OTHER): Payer: PRIVATE HEALTH INSURANCE | Admitting: Pulmonary Disease

## 2012-01-18 VITALS — BP 142/90 | HR 105 | Temp 98.1°F | Ht 69.0 in | Wt 279.2 lb

## 2012-01-18 DIAGNOSIS — G4733 Obstructive sleep apnea (adult) (pediatric): Secondary | ICD-10-CM

## 2012-01-18 NOTE — Assessment & Plan Note (Signed)
The patient overall is doing well from a sleep apnea standpoint.  He feels that he is sleeping well, and has adequate daytime alertness.  We do have to troubleshoot his mask fit, and I have also asked him to work on weight loss and so that we can avoid having to readjust his pressure.

## 2012-01-18 NOTE — Progress Notes (Signed)
  Subjective:    Patient ID: Ricky Molina, male    DOB: June 03, 1965, 46 y.o.   MRN: [id number]  HPI Patient comes in today for followup of his known obstructive sleep apnea.  He is wearing CPAP compliantly by his download, and does not have any significant mask leak noted.  However, he is noting a "fluttering sound" during the night at times from mask leak.  It is unclear if he has a mass that needs to be replaced 6-12 months, or whether it has a cushion that is replaced more frequently.  We will try to troubleshoot this.  He feels that he sleeps well, and denies any significant daytime alertness issues.  His download show a mild increase in his AHI to 6 per hour, but he is also gained 15 pounds since last visit as well.   Review of Systems  Constitutional: Negative for fever and unexpected weight change.  HENT: Positive for sneezing. Negative for ear pain, nosebleeds, congestion, sore throat, rhinorrhea, trouble swallowing, dental problem, postnasal drip and sinus pressure.   Eyes: Negative for redness and itching.  Respiratory: Positive for shortness of breath and wheezing. Negative for cough and chest tightness.   Cardiovascular: Positive for palpitations. Negative for leg swelling.  Gastrointestinal: Positive for nausea and vomiting.       24 hour bug x 2 days ago  Genitourinary: Negative for dysuria.  Musculoskeletal: Negative for joint swelling.  Skin: Negative for rash.  Neurological: Negative for headaches.  Hematological: Does not bruise/bleed easily.  Psychiatric/Behavioral: Negative for dysphoric mood. The patient is not nervous/anxious.        Objective:   Physical Exam Overweight male in no acute distress Nose without purulence or discharge noted No skin breakdown or pressure necrosis from CPAP mask Lower extremities without edema, no cyanosis Alert and oriented, moves all 4 extremities.       Assessment & Plan:

## 2012-01-18 NOTE — Patient Instructions (Addendum)
Work on weight reduction.  If your weight goes up much more, we may need to re-adjust your pressure Consider turning off ramp if you are not getting enough air initially See if replacing mask cushion more frequently will solve your leak problem.  If not, let me know and we will see what we can do.  followup with me in one year, but call if having problems.

## 2012-12-03 ENCOUNTER — Telehealth: Payer: Self-pay | Admitting: Internal Medicine

## 2012-12-03 NOTE — Telephone Encounter (Signed)
lmovm to return call. DJR  

## 2012-12-03 NOTE — Telephone Encounter (Signed)
Advise patient: It receive labs from his orthopedic surgeon, glucose is elevated, liver tests abnormal. I recommend a office visit to discuss results. --- Labs ordered by orthopedic surgery 11/14/2012: CBC with a white count of 4.4, hemoglobin 16, platelets 144. Potassium 4.0, glucose 206, creatinine 0.5, bilirubin 1.4, AST 161, ALT 141. Uric acid normal. Results will be scan

## 2012-12-04 ENCOUNTER — Encounter: Payer: Self-pay | Admitting: *Deleted

## 2012-12-04 NOTE — Telephone Encounter (Signed)
Letter sent. DJR

## 2012-12-18 ENCOUNTER — Telehealth: Payer: Self-pay

## 2012-12-18 NOTE — Telephone Encounter (Signed)
Left message for call back non identifiable  Did not have CCS as advised Tdap--04/2011 No PSA on file

## 2012-12-19 ENCOUNTER — Encounter: Payer: Self-pay | Admitting: Internal Medicine

## 2012-12-19 ENCOUNTER — Ambulatory Visit (INDEPENDENT_AMBULATORY_CARE_PROVIDER_SITE_OTHER): Payer: PRIVATE HEALTH INSURANCE | Admitting: Internal Medicine

## 2012-12-19 VITALS — BP 122/81 | HR 86 | Temp 98.4°F | Ht 70.0 in | Wt 262.0 lb

## 2012-12-19 DIAGNOSIS — K760 Fatty (change of) liver, not elsewhere classified: Secondary | ICD-10-CM

## 2012-12-19 DIAGNOSIS — Z23 Encounter for immunization: Secondary | ICD-10-CM

## 2012-12-19 DIAGNOSIS — F1010 Alcohol abuse, uncomplicated: Secondary | ICD-10-CM

## 2012-12-19 DIAGNOSIS — K7689 Other specified diseases of liver: Secondary | ICD-10-CM

## 2012-12-19 DIAGNOSIS — F329 Major depressive disorder, single episode, unspecified: Secondary | ICD-10-CM

## 2012-12-19 DIAGNOSIS — Z0000 Encounter for general adult medical examination without abnormal findings: Secondary | ICD-10-CM

## 2012-12-19 DIAGNOSIS — F341 Dysthymic disorder: Secondary | ICD-10-CM

## 2012-12-19 DIAGNOSIS — F32A Depression, unspecified: Secondary | ICD-10-CM | POA: Insufficient documentation

## 2012-12-19 DIAGNOSIS — F419 Anxiety disorder, unspecified: Secondary | ICD-10-CM

## 2012-12-19 HISTORY — DX: Anxiety disorder, unspecified: F41.9

## 2012-12-19 LAB — CBC WITH DIFFERENTIAL/PLATELET
Basophils Absolute: 0.1 10*3/uL (ref 0.0–0.1)
HCT: 43.2 % (ref 39.0–52.0)
Hemoglobin: 15.5 g/dL (ref 13.0–17.0)
Lymphocytes Relative: 31 % (ref 12–46)
Monocytes Absolute: 0.7 10*3/uL (ref 0.1–1.0)
Neutro Abs: 4.3 10*3/uL (ref 1.7–7.7)
RBC: 4.49 MIL/uL (ref 4.22–5.81)
RDW: 13 % (ref 11.5–15.5)
WBC: 7.9 10*3/uL (ref 4.0–10.5)

## 2012-12-19 LAB — COMPREHENSIVE METABOLIC PANEL
ALT: 78 U/L — ABNORMAL HIGH (ref 0–53)
BUN: 9 mg/dL (ref 6–23)
CO2: 27 mEq/L (ref 19–32)
Calcium: 9.5 mg/dL (ref 8.4–10.5)
Chloride: 102 mEq/L (ref 96–112)
Creat: 0.63 mg/dL (ref 0.50–1.35)
Glucose, Bld: 89 mg/dL (ref 70–99)

## 2012-12-19 LAB — LIPID PANEL
Cholesterol: 221 mg/dL — ABNORMAL HIGH (ref 0–200)
HDL: 52 mg/dL (ref >39)
Total CHOL/HDL Ratio: 4.3 Ratio
Triglycerides: 101 mg/dL (ref <150)

## 2012-12-19 MED ORDER — ESCITALOPRAM OXALATE 10 MG PO TABS: 10.0000 mg | ORAL_TABLET | Freq: Every day | ORAL | Status: DC

## 2012-12-19 NOTE — Assessment & Plan Note (Addendum)
Td 2013 rec flu shot today Diet-exercise discussed  Labs

## 2012-12-19 NOTE — Assessment & Plan Note (Addendum)
Reports depression-anxiety, PHQ 9 is 15--- moderate depression Discussed therapy: SSRIs, counseling Plan: agreed to try a SSRI, start lexapro, RTC 4 weeks, s/e discussed including suicidality

## 2012-12-19 NOTE — Assessment & Plan Note (Addendum)
Abstinent for a month, strongly recommend to look for help such as AA, patient declines.

## 2012-12-19 NOTE — Progress Notes (Signed)
  Subjective:    Patient ID: Ricky Molina, male    DOB: 11-09-1965, 47 y.o.   MRN: [id number]  HPI Complete physical exam, we also discussed the following: History of heavy drinking up to 18 cans of beer a day, quit a month ago because he was feeling "awful physically and mentally". Withdrawals ? He actually reports that has some shaky hands chronically and they are better since he quit drinking. no mental confusion. 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       Objective:   Physical Exam BP 122/81  Pulse 86  Temp(Src) 98.4 F (36.9 C)  Ht 5\' 10"  (1.778 m)  Wt 262 lb (118.842 kg)  BMI 37.59 kg/m2  SpO2 96% General -- alert, well-developed, NAD, + central obesity.  Neck --no thyromegaly   HEENT-- Not pale or jaundice Lungs -- normal respiratory effort, no intercostal retractions, no accessory muscle use, and normal breath sounds.  Heart-- normal rate, regular rhythm, no murmur.  Abdomen-- Not distended, good bowel sounds,soft,  non-tender. No rebound or rigidity. No mass,organomegaly. Extremities-- no pretibial edema bilaterally  Neurologic--  alert & oriented X3. Speech normal, gait normal, strength normal in all extremities.  Psych-- Cognition and judgment appear intact. Cooperative with normal attention span and concentration. No anxious appearing , no depressed appearing.      Assessment & Plan:  Labs from ~ 11-2012 dose at ortho: CBC with a white count of 4.4, hemoglobin 16, platelets 144.  Potassium 4.0, glucose 206, creatinine 0.5, bilirubin 1.4, AST 161, ALT 141.  Uric acid normal.   Hyperglycemia ----check the A1c

## 2012-12-19 NOTE — Progress Notes (Signed)
Pre visit review using our clinic review tool, if applicable. No additional management support is needed unless otherwise documented below in the visit note. 

## 2012-12-19 NOTE — Patient Instructions (Addendum)
Get your blood work before you leave  Next visit in 4 weeks  for a  follow up regards anxiety. (30 minutes) No Fasting Please make an appointment

## 2012-12-19 NOTE — Assessment & Plan Note (Addendum)
History of hepatic steatosis, he is a drinker, quit a month ago, LFTs last month elevated when orthopedic surgery checked. Plan:  Labs Ultrasound of the liver

## 2012-12-20 LAB — TSH: TSH: 1.62 u[IU]/mL (ref 0.350–4.500)

## 2012-12-22 NOTE — Telephone Encounter (Signed)
Unable to reach pre visit.  

## 2012-12-23 ENCOUNTER — Telehealth: Payer: Self-pay | Admitting: Internal Medicine

## 2012-12-23 NOTE — Telephone Encounter (Signed)
Patient wife called for her husband inquiring  about a referral to a physiatrist. She wanted to know which one was dr Drue Novel thinking about referring her husband to see. She had in mind Dr Dellia Cloud but he is not taking new patients.

## 2012-12-23 NOTE — Telephone Encounter (Signed)
Please advise 

## 2012-12-23 NOTE — Telephone Encounter (Signed)
There are a number of counselors in town, we have to very good counselors in this office if he likes to make an appointment

## 2012-12-29 NOTE — Telephone Encounter (Signed)
Pt notified. DJR  

## 2013-01-16 ENCOUNTER — Ambulatory Visit (INDEPENDENT_AMBULATORY_CARE_PROVIDER_SITE_OTHER): Payer: PRIVATE HEALTH INSURANCE | Admitting: Internal Medicine

## 2013-01-16 ENCOUNTER — Encounter: Payer: Self-pay | Admitting: Internal Medicine

## 2013-01-16 VITALS — BP 122/71 | HR 99 | Temp 98.0°F | Wt 258.0 lb

## 2013-01-16 DIAGNOSIS — E119 Type 2 diabetes mellitus without complications: Secondary | ICD-10-CM

## 2013-01-16 DIAGNOSIS — K760 Fatty (change of) liver, not elsewhere classified: Secondary | ICD-10-CM

## 2013-01-16 DIAGNOSIS — F341 Dysthymic disorder: Secondary | ICD-10-CM

## 2013-01-16 DIAGNOSIS — F329 Major depressive disorder, single episode, unspecified: Secondary | ICD-10-CM

## 2013-01-16 DIAGNOSIS — K7689 Other specified diseases of liver: Secondary | ICD-10-CM

## 2013-01-16 DIAGNOSIS — F1010 Alcohol abuse, uncomplicated: Secondary | ICD-10-CM

## 2013-01-16 LAB — HEPATIC FUNCTION PANEL
ALT: 57 U/L — ABNORMAL HIGH (ref 0–53)
Bilirubin, Direct: 0.2 mg/dL (ref 0.0–0.3)
Indirect Bilirubin: 0.8 mg/dL (ref 0.0–0.9)

## 2013-01-16 NOTE — Patient Instructions (Signed)
Get your blood work before you leave  Next visit for   follow up   fasting, in 4 months  Please make an appointment     The [facility] web site for Diabetes [email or web address]   If you need more information about a healthy diet, the American Heart Association is a great resource online at:  M[employer]on101.pl  We can also refer you to a nutritionist.

## 2013-01-16 NOTE — Assessment & Plan Note (Signed)
A1c 6.6 last month, patient aware he has mild diabetes. Disease process and  A1c concept discuss. See instructions

## 2013-01-16 NOTE — Progress Notes (Signed)
Pre visit review using our clinic review tool, if applicable. No additional management support is needed unless otherwise documented below in the visit note. 

## 2013-01-16 NOTE — Assessment & Plan Note (Signed)
On Lexapro, feeling great, no change for now, recheck in 4 months

## 2013-01-16 NOTE — Assessment & Plan Note (Signed)
Remains alcohol free, praised

## 2013-01-16 NOTE — Progress Notes (Signed)
   Subjective:    Patient ID: Ricky Molina, male    DOB: Feb 21, 1965, 47 y.o.   MRN: [id number]  HPI Followup from previous visit Started Lexapro, feels great. His wife  Is the one that felt he was depressed, she is very happy with how things are going. alcohol abuse-does not drink at all. Recently diagnosed with diabetes, he has changed his diet significantly, is more active, has lost some weight.  Past Medical History  Diagnosis Date  . Hyperlipidemia   . Allergic rhinitis   . OSA on CPAP   . Diverticulitis 03/23/2011    possible  . Alcoholism 03/24/2011  . Hepatic steatosis 03/25/2011  . Ascites     mild on CT 03-23-11  . Anxiety and depression 12/19/2012  . Elevated BP    Past Surgical History  Procedure Laterality Date  . Appendectomy  1997   History   Social History  . Marital Status: Married    Spouse Name: Lawson Fiscal    Number of Children: 2  . Years of Education: N/A   Occupational History  . Surveyor, minerals    Social History Main Topics  . Smoking status: Former Smoker -- 1.00 packs/day for 30 years    Types: Cigarettes    Quit date: 01/20/2011  . Smokeless tobacco: Never Used  . Alcohol Use: 25.2 oz/week    42 Cans of beer per week     Comment: h/o abuse, no ETOH since [id number]  . Drug Use: No  . Sexual Activity: No   Other Topics Concern  . Not on file   Social History Narrative   Married with 2 sons one daughter.   Surveyor, minerals              Family history Diabetes--F HTN-- F CAD-- F MI in his 62s, CHF father   Stroke-- F Colon cancer--no Breast cancer-- M Skin cancer-- M Prostate cancer--no  Review of Systems Denies nausea, vomiting, diarrhea or abdominal pain    Objective:   Physical Exam BP 122/71  Pulse 99  Temp(Src) 98 F (36.7 C)  Wt 258 lb (117.028 kg)  SpO2 93% General -- alert, well-developed, NAD.     Abdomen-- Not distended, good bowel sounds,soft, non-tender. No rebound or rigidity. No mass,organomegaly.  Extremities-- no pretibial edema bilaterally  Neurologic--  alert & oriented X3. Speech normal, gait normal, strength normal in all extremities. Psych-- Cognition and judgment appear intact. Cooperative with normal attention span and concentration. No anxious appearing , no depressed appearing.      Assessment & Plan:  Today , I spent more than 15  min with the patient, >50% of the time counseling

## 2013-01-16 NOTE — Assessment & Plan Note (Signed)
Remains alcohol free, has lost some weight, feeling well.  History of negative hepatitis serology. Did not have a ultrasound but on chart review  Had a CT of the abdomen last year: liver steatosis. Plan: Recheck LFTs.

## 2013-01-17 ENCOUNTER — Encounter: Payer: Self-pay | Admitting: Internal Medicine

## 2013-01-19 ENCOUNTER — Ambulatory Visit (INDEPENDENT_AMBULATORY_CARE_PROVIDER_SITE_OTHER): Payer: PRIVATE HEALTH INSURANCE | Admitting: Pulmonary Disease

## 2013-01-19 ENCOUNTER — Encounter: Payer: Self-pay | Admitting: Pulmonary Disease

## 2013-01-19 VITALS — BP 138/78 | HR 98 | Temp 98.3°F | Ht 69.0 in | Wt 257.0 lb

## 2013-01-19 DIAGNOSIS — G4733 Obstructive sleep apnea (adult) (pediatric): Secondary | ICD-10-CM

## 2013-01-19 NOTE — Patient Instructions (Signed)
Continue with cpap as you are doing. Try to keep up with mask cushion changes as you are able Keep working on weight loss.  You are doing great.  followup with me in one year if doing well.

## 2013-01-19 NOTE — Progress Notes (Signed)
   Subjective:    Patient ID: Ricky Molina, male    DOB: [birth year], 47 y.o.   MRN: [id number]  HPI The patient comes in today for followup of his obstructive sleep apnea. He is wearing CPAP compliantly by his download, and has excellent control of his events. His pressure was initially a little, but he thinks that it is in a good range now that he has lost significant weight. His weight has gone down more than 22 pounds since the last visit.  He is having some mask leak because of the age of cushions, but he is not able to afford to replace them. He feels that he sleeps well, and has excellent daytime alertness.   Review of Systems  Constitutional: Negative for fever and unexpected weight change.  HENT: Negative for congestion, dental problem, ear pain, nosebleeds, postnasal drip, rhinorrhea, sinus pressure, sneezing, sore throat and trouble swallowing.   Eyes: Negative for redness and itching.  Respiratory: Negative for cough, chest tightness, shortness of breath and wheezing.   Cardiovascular: Negative for palpitations and leg swelling.  Gastrointestinal: Negative for nausea and vomiting.  Genitourinary: Negative for dysuria.  Musculoskeletal: Negative for joint swelling.  Skin: Negative for rash.  Neurological: Negative for headaches.  Hematological: Does not bruise/bleed easily.  Psychiatric/Behavioral: Negative for dysphoric mood. The patient is not nervous/anxious.        Objective:   Physical Exam Overweight male in no acute distress No skin breakdown or pressure necrosis from the CPAP mask Nose without purulence or discharge noted Neck without lymphadenopathy or thyromegaly Lower extremities without edema, no cyanosis Alert and oriented, moves all 4 extremities.       Assessment & Plan:

## 2013-01-19 NOTE — Assessment & Plan Note (Signed)
The patient is doing very well on CPAP therapy, and has even lost significant weight since the last visit. His download shows excellent compliance and good control of his events. I have asked him to continue working on weight loss, and to replace his mask cushions as he is able.

## 2013-01-20 ENCOUNTER — Encounter: Payer: Self-pay | Admitting: *Deleted

## 2013-03-09 ENCOUNTER — Other Ambulatory Visit: Payer: Self-pay | Admitting: Internal Medicine

## 2013-03-10 NOTE — Telephone Encounter (Signed)
Escitalopram refilled per protocol. JG//CMA 

## 2013-05-15 ENCOUNTER — Emergency Department (HOSPITAL_COMMUNITY): Payer: Worker's Compensation

## 2013-05-15 ENCOUNTER — Emergency Department (HOSPITAL_COMMUNITY)
Admission: EM | Admit: 2013-05-15 | Discharge: 2013-05-15 | Disposition: A | Discharge status: Home / Self Care | Payer: Worker's Compensation | Attending: Emergency Medicine | Admitting: Emergency Medicine

## 2013-05-15 ENCOUNTER — Encounter (HOSPITAL_COMMUNITY): Payer: Self-pay | Admitting: Emergency Medicine

## 2013-05-15 DIAGNOSIS — S0990XA Unspecified injury of head, initial encounter: Secondary | ICD-10-CM | POA: Insufficient documentation

## 2013-05-15 DIAGNOSIS — S01309A Unspecified open wound of unspecified ear, initial encounter: Secondary | ICD-10-CM | POA: Insufficient documentation

## 2013-05-15 DIAGNOSIS — S42001A Fracture of unspecified part of right clavicle, initial encounter for closed fracture: Secondary | ICD-10-CM

## 2013-05-15 DIAGNOSIS — S20219A Contusion of unspecified front wall of thorax, initial encounter: Secondary | ICD-10-CM | POA: Insufficient documentation

## 2013-05-15 DIAGNOSIS — R079 Chest pain, unspecified: Secondary | ICD-10-CM | POA: Insufficient documentation

## 2013-05-15 DIAGNOSIS — Z8709 Personal history of other diseases of the respiratory system: Secondary | ICD-10-CM | POA: Insufficient documentation

## 2013-05-15 DIAGNOSIS — F329 Major depressive disorder, single episode, unspecified: Secondary | ICD-10-CM | POA: Insufficient documentation

## 2013-05-15 DIAGNOSIS — S060XAA Concussion with loss of consciousness status unknown, initial encounter: Secondary | ICD-10-CM

## 2013-05-15 DIAGNOSIS — Y9389 Activity, other specified: Secondary | ICD-10-CM | POA: Insufficient documentation

## 2013-05-15 DIAGNOSIS — Z8719 Personal history of other diseases of the digestive system: Secondary | ICD-10-CM | POA: Insufficient documentation

## 2013-05-15 DIAGNOSIS — S42009A Fracture of unspecified part of unspecified clavicle, initial encounter for closed fracture: Secondary | ICD-10-CM | POA: Insufficient documentation

## 2013-05-15 DIAGNOSIS — F411 Generalized anxiety disorder: Secondary | ICD-10-CM | POA: Insufficient documentation

## 2013-05-15 DIAGNOSIS — F3289 Other specified depressive episodes: Secondary | ICD-10-CM | POA: Insufficient documentation

## 2013-05-15 DIAGNOSIS — S01311A Laceration without foreign body of right ear, initial encounter: Secondary | ICD-10-CM

## 2013-05-15 DIAGNOSIS — Z79899 Other long term (current) drug therapy: Secondary | ICD-10-CM | POA: Insufficient documentation

## 2013-05-15 DIAGNOSIS — Z87891 Personal history of nicotine dependence: Secondary | ICD-10-CM | POA: Insufficient documentation

## 2013-05-15 DIAGNOSIS — G4733 Obstructive sleep apnea (adult) (pediatric): Secondary | ICD-10-CM | POA: Insufficient documentation

## 2013-05-15 DIAGNOSIS — W1809XA Striking against other object with subsequent fall, initial encounter: Secondary | ICD-10-CM | POA: Insufficient documentation

## 2013-05-15 DIAGNOSIS — Y9269 Other specified industrial and construction area as the place of occurrence of the external cause: Secondary | ICD-10-CM | POA: Insufficient documentation

## 2013-05-15 DIAGNOSIS — F1021 Alcohol dependence, in remission: Secondary | ICD-10-CM | POA: Insufficient documentation

## 2013-05-15 DIAGNOSIS — Z862 Personal history of diseases of the blood and blood-forming organs and certain disorders involving the immune mechanism: Secondary | ICD-10-CM | POA: Insufficient documentation

## 2013-05-15 DIAGNOSIS — Z8639 Personal history of other endocrine, nutritional and metabolic disease: Secondary | ICD-10-CM | POA: Insufficient documentation

## 2013-05-15 DIAGNOSIS — S060X9A Concussion with loss of consciousness of unspecified duration, initial encounter: Secondary | ICD-10-CM

## 2013-05-15 DIAGNOSIS — S5290XA Unspecified fracture of unspecified forearm, initial encounter for closed fracture: Secondary | ICD-10-CM | POA: Insufficient documentation

## 2013-05-15 DIAGNOSIS — S52209A Unspecified fracture of shaft of unspecified ulna, initial encounter for closed fracture: Secondary | ICD-10-CM

## 2013-05-15 DIAGNOSIS — Z9981 Dependence on supplemental oxygen: Secondary | ICD-10-CM | POA: Insufficient documentation

## 2013-05-15 LAB — COMPREHENSIVE METABOLIC PANEL
ALBUMIN: 3.6 g/dL (ref 3.5–5.2)
ALT: 91 U/L — ABNORMAL HIGH (ref 0–53)
AST: 103 U/L — AB (ref 0–37)
Alkaline Phosphatase: 125 U/L — ABNORMAL HIGH (ref 39–117)
BUN: 9 mg/dL (ref 6–23)
CHLORIDE: 98 meq/L (ref 96–112)
CO2: 23 mEq/L (ref 19–32)
CREATININE: 0.62 mg/dL (ref 0.50–1.35)
Calcium: 9.3 mg/dL (ref 8.4–10.5)
GFR calc Af Amer: >90 mL/min (ref >90)
GFR calc non Af Amer: >90 mL/min (ref >90)
Glucose, Bld: 245 mg/dL — ABNORMAL HIGH (ref 70–99)
Potassium: 4.1 mEq/L (ref 3.7–5.3)
Sodium: 137 mEq/L (ref 137–147)
TOTAL PROTEIN: 7.6 g/dL (ref 6.0–8.3)
Total Bilirubin: 1 mg/dL (ref 0.3–1.2)

## 2013-05-15 LAB — I-STAT CHEM 8, ED
BUN: 7 mg/dL (ref 6–23)
CHLORIDE: 101 meq/L (ref 96–112)
Calcium, Ion: 1.18 mmol/L (ref 1.12–1.23)
Creatinine, Ser: 0.7 mg/dL (ref 0.50–1.35)
Glucose, Bld: 246 mg/dL — ABNORMAL HIGH (ref 70–99)
HCT: 50 % (ref 39.0–52.0)
Hemoglobin: 17 g/dL (ref 13.0–17.0)
Potassium: 3.9 mEq/L (ref 3.7–5.3)
SODIUM: 139 meq/L (ref 137–147)
TCO2: 26 mmol/L (ref 0–100)

## 2013-05-15 LAB — CBC
HEMATOCRIT: 46.2 % (ref 39.0–52.0)
HEMOGLOBIN: 16.5 g/dL (ref 13.0–17.0)
MCH: 34.3 pg — ABNORMAL HIGH (ref 26.0–34.0)
MCHC: 35.7 g/dL (ref 30.0–36.0)
MCV: 96 fL (ref 78.0–100.0)
Platelets: 123 10*3/uL — ABNORMAL LOW (ref 150–400)
RBC: 4.81 MIL/uL (ref 4.22–5.81)
RDW: 13.3 % (ref 11.5–15.5)
WBC: 7.8 10*3/uL (ref 4.0–10.5)

## 2013-05-15 LAB — SAMPLE TO BLOOD BANK

## 2013-05-15 LAB — PROTIME-INR
INR: 1.09 (ref 0.00–1.49)
Prothrombin Time: 13.9 seconds (ref 11.6–15.2)

## 2013-05-15 LAB — ETHANOL

## 2013-05-15 LAB — CDS SEROLOGY

## 2013-05-15 MED ORDER — FENTANYL CITRATE 0.05 MG/ML IJ SOLN
100.0000 ug | Freq: Once | INTRAMUSCULAR | Status: AC
  Administered 2013-05-15: 100 ug via INTRAVENOUS

## 2013-05-15 MED ORDER — FENTANYL CITRATE 0.05 MG/ML IJ SOLN: 100.0000 ug | Freq: Once | INTRAMUSCULAR | Status: DC

## 2013-05-15 MED ORDER — OXYCODONE-ACETAMINOPHEN 5-325 MG PO TABS: 1.0000 | ORAL_TABLET | Freq: Four times a day (QID) | ORAL | Status: DC | PRN

## 2013-05-15 MED ORDER — FENTANYL CITRATE 0.05 MG/ML IJ SOLN
50.0000 ug | Freq: Once | INTRAMUSCULAR | Status: AC
  Administered 2013-05-15: 50 ug via INTRAVENOUS

## 2013-05-15 MED ORDER — IOHEXOL 300 MG/ML  SOLN
100.0000 mL | Freq: Once | INTRAMUSCULAR | Status: AC | PRN
  Administered 2013-05-15: 100 mL via INTRAVENOUS

## 2013-05-15 MED ORDER — ONDANSETRON HCL 4 MG/2ML IJ SOLN
4.0000 mg | Freq: Once | INTRAMUSCULAR | Status: AC
  Administered 2013-05-15: 4 mg via INTRAVENOUS
  Filled 2013-05-15: qty 2

## 2013-05-15 MED ORDER — AMOXICILLIN-POT CLAVULANATE 875-125 MG PO TABS: 1.0000 | ORAL_TABLET | Freq: Two times a day (BID) | ORAL | Status: DC

## 2013-05-15 MED ORDER — OXYCODONE-ACETAMINOPHEN 5-325 MG PO TABS
2.0000 | ORAL_TABLET | Freq: Once | ORAL | Status: AC
  Administered 2013-05-15: 2 via ORAL
  Filled 2013-05-15: qty 2

## 2013-05-15 MED ORDER — FENTANYL CITRATE 0.05 MG/ML IJ SOLN
INTRAMUSCULAR | Status: AC
  Filled 2013-05-15: qty 2

## 2013-05-15 MED ORDER — FENTANYL CITRATE 0.05 MG/ML IJ SOLN
INTRAMUSCULAR | Status: AC
  Administered 2013-05-15: 100 ug via INTRAVENOUS
  Filled 2013-05-15: qty 2

## 2013-05-15 MED ORDER — HYDROCODONE-ACETAMINOPHEN 5-325 MG PO TABS
2.0000 | ORAL_TABLET | Freq: Once | ORAL | Status: AC
  Administered 2013-05-15: 2 via ORAL
  Filled 2013-05-15: qty 2

## 2013-05-15 MED ORDER — SODIUM CHLORIDE 0.9 % IV BOLUS (SEPSIS)
1000.0000 mL | Freq: Once | INTRAVENOUS | Status: AC
  Administered 2013-05-15: 1000 mL via INTRAVENOUS

## 2013-05-15 NOTE — ED Notes (Signed)
MD at bedside. Dr. Docherty. 

## 2013-05-15 NOTE — Discharge Instructions (Signed)
Clavicle Fracture A clavicle fracture is a break in the collarbone. This is a common injury, especially in children. Collarbones do not harden until around the age of 20. Most collarbone fractures are treated with a simple arm sling. In some cases a figure-of-eight splint is used to help hold the broken bones in position. Although not often needed, surgery may be required if the bone fragments are not in the correct position (displaced).  HOME CARE INSTRUCTIONS   Apply ice to the injury for 15-20 minutes each hour while awake for 2 days. Put the ice in a plastic bag and place a towel between the bag of ice and your skin.  Wear the sling or splint constantly for as long as directed by your caregiver. You may remove the sling or splint for bathing or showering. Be sure to keep your shoulder in the same place as when the sling or splint is on. Do not lift your arm.  If a figure-of-eight splint is applied, it must be tightened by another person every day. Tighten it enough to keep the shoulders held back. Allow enough room to place the index finger between the body and strap. Loosen the splint immediately if you feel numbness or tingling in your hands.  Only take over-the-counter or prescription medicines for pain, discomfort, or fever as directed by your caregiver.  Avoid activities that irritate or increase the pain for 4 to 6 weeks after surgery.  Follow all instructions for follow-up with your caregiver. This includes any referrals, physical therapy, and rehabilitation. Any delay in obtaining necessary care could result in a delay or failure of the injury to heal properly. SEEK MEDICAL CARE IF:  You have pain and swelling that are not relieved with medications. SEEK IMMEDIATE MEDICAL CARE IF:  Your arm is numb, cold, or pale, even when the splint is loose. MAKE SURE YOU:   Understand these instructions.  Will watch your condition.  Will get help right away if you are not doing well or get  worse. Document Released: 11/01/2004 Document Revised: 04/16/2011 Document Reviewed: 08/28/2007 [facility] Patient Information 2014 [place], [place]. Laceration Care, Adult A laceration is a cut or lesion that goes through all layers of the skin and into the tissue just beneath the skin. TREATMENT  Some lacerations may not require closure. Some lacerations may not be able to be closed due to an increased risk of infection. It is important to see your caregiver as soon as possible after an injury to minimize the risk of infection and maximize the opportunity for successful closure. If closure is appropriate, pain medicines may be given, if needed. The wound will be cleaned to help prevent infection. Your caregiver will use stitches (sutures), staples, wound glue (adhesive), or skin adhesive strips to repair the laceration. These tools bring the skin edges together to allow for faster healing and a better cosmetic outcome. However, all wounds will heal with a scar. Once the wound has healed, scarring can be minimized by covering the wound with sunscreen during the day for 1 full year. HOME CARE INSTRUCTIONS  For sutures or staples:  Keep the wound clean and dry.  If you were given a bandage (dressing), you should change it at least once a day. Also, change the dressing if it becomes wet or dirty, or as directed by your caregiver.  Wash the wound with soap and water 2 times a day. Rinse the wound off with water to remove all soap. Pat the wound dry with a  clean towel.  After cleaning, apply a thin layer of the antibiotic ointment as recommended by your caregiver. This will help prevent infection and keep the dressing from sticking.  You may shower as usual after the first 24 hours. Do not soak the wound in water until the sutures are removed.  Only take over-the-counter or prescription medicines for pain, discomfort, or fever as directed by your caregiver.  Get your sutures or staples removed as  directed by your caregiver. For skin adhesive strips:  Keep the wound clean and dry.  Do not get the skin adhesive strips wet. You may bathe carefully, using caution to keep the wound dry.  If the wound gets wet, pat it dry with a clean towel.  Skin adhesive strips will fall off on their own. You may trim the strips as the wound heals. Do not remove skin adhesive strips that are still stuck to the wound. They will fall off in time. For wound adhesive:  You may briefly wet your wound in the shower or bath. Do not soak or scrub the wound. Do not swim. Avoid periods of heavy perspiration until the skin adhesive has fallen off on its own. After showering or bathing, gently pat the wound dry with a clean towel.  Do not apply liquid medicine, cream medicine, or ointment medicine to your wound while the skin adhesive is in place. This may loosen the film before your wound is healed.  If a dressing is placed over the wound, be careful not to apply tape directly over the skin adhesive. This may cause the adhesive to be pulled off before the wound is healed.  Avoid prolonged exposure to sunlight or tanning lamps while the skin adhesive is in place. Exposure to ultraviolet light in the first year will darken the scar.  The skin adhesive will usually remain in place for 5 to 10 days, then naturally fall off the skin. Do not pick at the adhesive film. You may need a tetanus shot if:  You cannot remember when you had your last tetanus shot.  You have never had a tetanus shot. If you get a tetanus shot, your arm may swell, get red, and feel warm to the touch. This is common and not a problem. If you need a tetanus shot and you choose not to have one, there is a rare chance of getting tetanus. Sickness from tetanus can be serious. SEEK MEDICAL CARE IF:   You have redness, swelling, or increasing pain in the wound.  You see a red line that goes away from the wound.  You have yellowish-white fluid  (pus) coming from the wound.  You have a fever.  You notice a bad smell coming from the wound or dressing.  Your wound breaks open before or after sutures have been removed.  You notice something coming out of the wound such as wood or glass.  Your wound is on your hand or foot and you cannot move a finger or toe. SEEK IMMEDIATE MEDICAL CARE IF:   Your pain is not controlled with prescribed medicine.  You have severe swelling around the wound causing pain and numbness or a change in color in your arm, hand, leg, or foot.  Your wound splits open and starts bleeding.  You have worsening numbness, weakness, or loss of function of any joint around or beyond the wound.  You develop painful lumps near the wound or on the skin anywhere on your body. MAKE SURE YOU:  Understand these instructions.  Will watch your condition.  Will get help right away if you are not doing well or get worse. Document Released: 01/22/2005 Document Revised: 04/16/2011 Document Reviewed: 07/18/2010 [facility] Patient Information 2014 [place], [place]. Head Injury, Adult You have received a head injury. It does not appear serious at this time. Headaches and vomiting are common following head injury. It should be easy to awaken from sleeping. Sometimes it is necessary for you to stay in the emergency department for a while for observation. Sometimes admission to the hospital may be needed. After injuries such as yours, most problems occur within the first 24 hours, but side effects may occur up to 7 10 days after the injury. It is important for you to carefully monitor your condition and contact your health care provider or seek immediate medical care if there is a change in your condition. WHAT ARE THE TYPES OF HEAD INJURIES? Head injuries can be as minor as a bump. Some head injuries can be more severe. More severe head injuries include:  A jarring injury to the brain (concussion).  A bruise of the brain  (contusion). This mean there is bleeding in the brain that can cause swelling.  A cracked skull (skull fracture).  Bleeding in the brain that collects, clots, and forms a bump (hematoma). WHAT CAUSES A HEAD INJURY? A serious head injury is most likely to happen to someone who is in a car wreck and is not wearing a seat belt. Other causes of major head injuries include bicycle or motorcycle accidents, sports injuries, and falls. HOW ARE HEAD INJURIES DIAGNOSED? A complete history of the event leading to the injury and your current symptoms will be helpful in diagnosing head injuries. Many times, pictures of the brain, such as CT or MRI are needed to see the extent of the injury. Often, an overnight hospital stay is necessary for observation.  WHEN SHOULD I SEEK IMMEDIATE MEDICAL CARE?  You should get help right away if:  You have confusion or drowsiness.  You feel sick to your stomach (nauseous) or have continued, forceful vomiting.  You have dizziness or unsteadiness that is getting worse.  You have severe, continued headaches not relieved by medicine. Only take over-the-counter or prescription medicines for pain, fever, or discomfort as directed by your health care provider.  You do not have normal function of the arms or legs or are unable to walk.  You notice changes in the black spots in the center of the colored part of your eye (pupil).  You have a clear or bloody fluid coming from your nose or ears.  You have a loss of vision. During the next 24 hours after the injury, you must stay with someone who can watch you for the warning signs. This person should contact local emergency services (911 in the [place] if you have seizures, you become unconscious, or you are unable to wake up. HOW CAN I PREVENT A HEAD INJURY IN THE FUTURE? The most important factor for preventing major head injuries is avoiding motor vehicle accidents. To minimize the potential for damage to your head, it is  crucial to wear seat belts while riding in motor vehicles. Wearing helmets while bike riding and playing collision sports (like football) is also helpful. Also, avoiding dangerous activities around the house will further help reduce your risk of head injury.  WHEN CAN I RETURN TO NORMAL ACTIVITIES AND ATHLETICS? You should be reevaluated by your health care provider before returning to these  activities. If you have any of the following symptoms, you should not return to activities or contact sports until 1 week after the symptoms have stopped:  Persistent headache.  Dizziness or vertigo.  Poor attention and concentration.  Confusion.  Memory problems.  Nausea or vomiting.  Fatigue or tire easily.  Irritability.  Intolerant of bright lights or loud noises.  Anxiety or depression.  Disturbed sleep. MAKE SURE YOU:   Understand these instructions.  Will watch your condition.  Will get help right away if you are not doing well or get worse. Document Released: 01/22/2005 Document Revised: 11/12/2012 Document Reviewed: 09/29/2012 [facility] Patient Information 2014 [place], [place].  Chest Contusion A chest contusion is a deep bruise on your chest area. Contusions are the result of an injury that caused bleeding under the skin. A chest contusion may involve bruising of the skin, muscles, or ribs. The contusion may turn blue, purple, or yellow. Minor injuries will give you a painless contusion, but more severe contusions may stay painful and swollen for a few weeks. CAUSES  A contusion is usually caused by a blow, trauma, or direct force to an area of the body. SYMPTOMS   Swelling and redness of the injured area.  Discoloration of the injured area.  Tenderness and soreness of the injured area.  Pain. DIAGNOSIS  The diagnosis can be made by taking a history and performing a physical exam. An X-ray, CT scan, or MRI may be needed to determine if there were any associated injuries,  such as broken bones (fractures) or internal injuries. TREATMENT  Often, the best treatment for a chest contusion is resting, icing, and applying cold compresses to the injured area. Deep breathing exercises may be recommended to reduce the risk of pneumonia. Over-the-counter medicines may also be recommended for pain control. HOME CARE INSTRUCTIONS   Put ice on the injured area.  Put ice in a plastic bag.  Place a towel between your skin and the bag.  Leave the ice on for 15-20 minutes, 03-04 times a day.  Only take over-the-counter or prescription medicines as directed by your caregiver. Your caregiver may recommend avoiding anti-inflammatory medicines (aspirin, ibuprofen, and naproxen) for 48 hours because these medicines may increase bruising.  Rest the injured area.  Perform deep-breathing exercises as directed by your caregiver.  Stop smoking if you smoke.  Do not lift objects over 5 pounds (2.3 kg) for 3 days or longer if recommended by your caregiver. SEEK IMMEDIATE MEDICAL CARE IF:     You have increased bruising or swelling.  You have pain that is getting worse.  You have difficulty breathing.  You have dizziness, weakness, or fainting.  You have blood in your urine or stool.  You cough up or vomit blood.  Your swelling or pain is not relieved with medicines. MAKE SURE YOU:   Understand these instructions.  Will watch your condition.  Will get help right away if you are not doing well or get worse. Document Released: 10/17/2000 Document Revised: 10/17/2011 Document Reviewed: 07/16/2011  No weight bearing and wear splint until cleared by ortho doctor. For severe pain take norco or vicodin however realize they have the potential for addiction and it can make you sleepy and has tylenol in it.  No operating machinery while taking.

## 2013-05-15 NOTE — ED Notes (Signed)
Radiology at bedside for portable films.

## 2013-05-15 NOTE — ED Notes (Signed)
Patient transported to X-ray 

## 2013-05-15 NOTE — ED Provider Notes (Signed)
CSN: [id number]     Arrival date & time 05/15/13  1018 History   First MD Initiated Contact with Patient 05/15/13 1033     Chief Complaint  Patient presents with  . Trauma  . Fall     (Consider location/radiation/quality/duration/timing/severity/associated sxs/prior Treatment) HPI Comments: Pt fell about 15 onto his head from a home construction site. No definite LOC< but was confued and had repetitive questioning on the scene per EMS.   Patient is a 48 y.o. male presenting with fall. The history is provided by the patient and the EMS personnel. No language interpreter was used.  Fall This is a new problem. The current episode started less than 1 hour ago. The problem occurs rarely. The problem has been gradually improving. Associated symptoms include chest pain and headaches. Pertinent negatives include no abdominal pain and no shortness of breath. Associated symptoms comments: R sided chest pain . Exacerbated by: breathing. Relieved by: sitting up. He has tried nothing for the symptoms. The treatment provided no relief.    Past Medical History  Diagnosis Date  . Hyperlipidemia   . Allergic rhinitis   . OSA on CPAP   . Diverticulitis 03/23/2011    possible  . Alcoholism 03/24/2011  . Hepatic steatosis 03/25/2011  . Ascites     mild on CT 03-23-11  . Anxiety and depression 12/19/2012  . Elevated BP    Past Surgical History  Procedure Laterality Date  . Appendectomy  1997   Family History  Problem Relation Age of Onset  . Emphysema Mother   . Hypertension Mother   . Skin cancer Mother     nose  . Breast cancer Mother   . Alcohol abuse Mother   . Breast cancer Mother   . Allergies Sister   . Heart failure Father   . Stroke Father   . Rheum arthritis Father   . Diabetes Father   . Alcohol abuse Father   . Arthritis Father   . Hyperlipidemia Father   . Heart disease Father   . Hypertension Father   . Hypothyroidism Sister    History  Substance Use Topics  . Smoking  status: Former Smoker -- 1.00 packs/day for 30 years    Types: Cigarettes    Quit date: 01/20/2011  . Smokeless tobacco: Never Used  . Alcohol Use: 25.2 oz/week    42 Cans of beer per week     Comment: h/o abuse, no ETOH since [id number]    Review of Systems  Constitutional: Negative for fever, activity change, appetite change and fatigue.  HENT: Negative for congestion, facial swelling, rhinorrhea and trouble swallowing.   Eyes: Negative for photophobia and pain.  Respiratory: Negative for cough, chest tightness and shortness of breath.   Cardiovascular: Positive for chest pain. Negative for leg swelling.  Gastrointestinal: Negative for nausea, vomiting, abdominal pain, diarrhea and constipation.  Endocrine: Negative for polydipsia and polyuria.  Genitourinary: Negative for dysuria, urgency, decreased urine volume and difficulty urinating.  Musculoskeletal: Negative for back pain and gait problem.  Skin: Negative for color change, rash and wound.  Allergic/Immunologic: Negative for immunocompromised state.  Neurological: Positive for headaches. Negative for dizziness, facial asymmetry, speech difficulty, weakness and numbness.  Psychiatric/Behavioral: Negative for confusion, decreased concentration and agitation.      Allergies  Morphine and related  Home Medications   Current Outpatient Rx  Name  Route  Sig  Dispense  Refill  . amoxicillin-clavulanate (AUGMENTIN) 875-125 MG per tablet   Oral   Take  1 tablet by mouth 2 (two) times daily. One po bid x 7 days   14 tablet   0   . calcium carbonate (TUMS - DOSED IN MG ELEMENTAL CALCIUM) 500 MG chewable tablet   Oral   Chew 1 tablet by mouth daily as needed. For antacid.         Marland Kitchen escitalopram (LEXAPRO) 10 MG tablet      TAKE 1 TABLET BY MOUTH ONCE DAILY   30 tablet   2   . ibuprofen (ADVIL,MOTRIN) 200 MG tablet   Oral   Take 400-800 mg by mouth every 8 (eight) hours as needed. For pain.         Marland Kitchen  oxyCODONE-acetaminophen (PERCOCET) 5-325 MG per tablet   Oral   Take 1 tablet by mouth every 6 (six) hours as needed.   20 tablet   0    BP 145/87  Pulse 104  Temp(Src) 100.4 F (38 C) (Oral)  Resp 18  Ht 5\' 9"  (1.753 m)  Wt 275 lb (124.739 kg)  BMI 40.59 kg/m2  SpO2 94% Physical Exam  Constitutional: He is oriented to person, place, and time. He appears well-developed and well-nourished. No distress.  HENT:  Head: Normocephalic and atraumatic.  Ears:  Mouth/Throat: No oropharyngeal exudate.  Laceration involving the anterior and posterior superior edge of the R pinna.   Eyes: Pupils are equal, round, and reactive to light.  Neck: Normal range of motion. Neck supple.  Cardiovascular: Normal rate, regular rhythm and normal heart sounds.  Exam reveals no gallop and no friction rub.   No murmur heard. Pulmonary/Chest: Effort normal and breath sounds normal. No respiratory distress. He has no wheezes. He has no rales. He exhibits deformity (of R clavicle).    Abdominal: Soft. Bowel sounds are normal. He exhibits no distension and no mass. There is no tenderness. There is no rebound and no guarding.  Musculoskeletal: Normal range of motion. He exhibits no edema and no tenderness.  Neurological: He is alert and oriented to person, place, and time. He has normal strength. No cranial nerve deficit or sensory deficit. He exhibits normal muscle tone. GCS eye subscore is 4. GCS verbal subscore is 5. GCS motor subscore is 6.  Skin: Skin is warm and dry.  Psychiatric: He has a normal mood and affect.    ED Course  LACERATION REPAIR Date/Time: 05/08/2013 1:30 PM Performed by: Toy Cookey, E Authorized by: Toy Cookey, E Consent: Verbal consent obtained. written consent not obtained. Risks and benefits: risks, benefits and alternatives were discussed Consent given by: patient Patient understanding: patient states understanding of the procedure being performed Patient consent:  the patient's understanding of the procedure matches consent given Procedure consent: procedure consent matches procedure scheduled Relevant documents: relevant documents present and verified Test results: test results available and properly labeled Site marked: the operative site was marked Imaging studies: imaging studies available Required items: required blood products, implants, devices, and special equipment available Patient identity confirmed: verbally with patient Body area: head/neck Location details: right ear Laceration length: 2 cm Foreign bodies: glass Tendon involvement: none Nerve involvement: none Vascular damage: no Anesthesia: nerve block (auricular block) Local anesthetic: lidocaine 1% without epinephrine Anesthetic total: 3 ml Patient sedated: no Preparation: Patient was prepped and draped in the usual sterile fashion. Irrigation solution: saline Amount of cleaning: extensive Debridement: none Degree of undermining: none Skin closure: 6-0 Prolene Number of sutures: 10 Technique: complex and simple Approximation: loose Approximation difficulty: complex Dressing: 4x4 sterile  gauze (pressure dressing) Patient tolerance: Patient tolerated the procedure well with no immediate complications.   (including critical care time) Labs Review Labs Reviewed  COMPREHENSIVE METABOLIC PANEL - Abnormal; Notable for the following:    Glucose, Bld 245 (*)    AST 103 (*)    ALT 91 (*)    Alkaline Phosphatase 125 (*)    All other components within normal limits  CBC - Abnormal; Notable for the following:    MCH 34.3 (*)    Platelets 123 (*)    All other components within normal limits  I-STAT CHEM 8, ED - Abnormal; Notable for the following:    Glucose, Bld 246 (*)    All other components within normal limits  CDS SEROLOGY  ETHANOL  PROTIME-INR  SAMPLE TO BLOOD BANK   Imaging Review Dg Clavicle Right  05/15/2013   CLINICAL DATA:  Fall.  Right clavicle region  pain.  EXAM: RIGHT CLAVICLE - 2+ VIEWS  COMPARISON:  None.  FINDINGS: There is an oblique fracture of the mid right clavicle, which is non comminuted. Distal fracture is displaced laterally by 11 mm. No fracture angulation.  No other fractures. AC joint and glenohumeral joints are normally aligned. Underlying ribs are intact.  IMPRESSION: 1. Fracture of the mid right clavicle as described.   Electronically Signed   By: Amie Portlandavid  Ormond M.D.   On: 05/15/2013 11:39   Dg Shoulder Right  05/15/2013   CLINICAL DATA:  Fall.  Right shoulder pain.  EXAM: RIGHT SHOULDER - 2+ VIEW  COMPARISON:  None.  FINDINGS: There is a fracture of the mid right clavicle, without comminution or significant displacement.  No other fractures. Glenohumeral joint and AC joints are normally aligned. The underlying ribs are intact.  IMPRESSION: Fracture of the mid right clavicle, without significant displacement. No other fractures. No dislocation.   Electronically Signed   By: Amie Portlandavid  Ormond M.D.   On: 05/15/2013 11:37   Dg Wrist Complete Right  05/15/2013   CLINICAL DATA:  Patient fell through attic roof on to the ground with trauma to right wrist  EXAM: RIGHT WRIST - COMPLETE 3+ VIEW  COMPARISON:  None.  FINDINGS: There is a horizontal fracture through the distal radial metaphysis. Fracture is mildly comminuted, with mild dorsal displacement of the dorsal fracture component there is nondisplaced hairline fracture through the ulnar epiphysis. All  IMPRESSION: Fractures of the distal radius and ulna   Electronically Signed   By: Esperanza Heiraymond  Rubner M.D.   On: 05/15/2013 16:49   Ct Head Wo Contrast  05/15/2013   CLINICAL DATA:  Fall from height. Trauma to the head. Bleeding from the right ear  EXAM: CT HEAD WITHOUT CONTRAST  CT CERVICAL SPINE WITHOUT CONTRAST  TECHNIQUE: Multidetector CT imaging of the head and cervical spine was performed following the standard protocol without intravenous contrast. Multiplanar CT image reconstructions of the  cervical spine were also generated.  COMPARISON:  None.  FINDINGS: CT HEAD FINDINGS  The brain has normal appearance without evidence of malformation, atrophy, old or acute infarction, mass lesion, hemorrhage, hydrocephalus or extra-axial collection. No fluid in the visualized sinuses, middle ears or mastoids. No evidence of temporal bone fracture on the right. Extensive soft tissue injury of pinna.  CT CERVICAL SPINE FINDINGS  Alignment is normal. No fracture. No soft tissue swelling. There is ordinary mild spondylosis at C3-4, C4-5 and C5-6 without significant osteophytic encroachment upon the canal or foramina. There is premature atherosclerotic calcification at both carotid bifurcation regions.  IMPRESSION: No skull fracture, temporal bone fracture or intracranial injury. Soft tissue injury to the right ear.  No cervical spine fracture. Ordinary spondylosis. Premature calcific atherosclerosis of the carotid bifurcation regions.   Electronically Signed   By: Paulina Fusi M.D.   On: 05/15/2013 11:35   Ct Chest W Contrast  05/15/2013   CLINICAL DATA:  Fall from 15 feet.  EXAM: CT CHEST, ABDOMEN, AND PELVIS WITH CONTRAST  TECHNIQUE: Multidetector CT imaging of the chest, abdomen and pelvis was performed following the standard protocol during bolus administration of intravenous contrast.  CONTRAST:  OMNIPAQUE IOHEXOL 300 MG/ML  SOLN  COMPARISON:  None.  FINDINGS: CT CHEST FINDINGS  No pneumothorax or hemothorax. There is atelectasis of the dependent lung bilaterally, most pronounced in the lower lobes. No rib or spinal fracture is seen. No sternal fracture.  No evidence of mediastinal bleeding. Premature coronary artery calcification is present.  CT ABDOMEN AND PELVIS FINDINGS  The liver has a normal appearance without focal lesions or traumatic finding. No calcified gallstones. The spleen is normal. The pancreas is normal. The adrenal glands are normal. Kidneys are normal. The aorta shows atherosclerosis  but no aneurysm. The IVC is normal. Bladder, prostate gland and seminal vesicles are normal. No evidence of fracture of the lumbar spine or pelvis. Ordinary degenerative changes are noted.  IMPRESSION: Dependent atelectasis in both lungs. No traumatic finding in the chest. Question aspiration.  No traumatic finding in the abdomen or pelvis.  Premature atherosclerosis of the coronary arteries and aorta .   Electronically Signed   By: Paulina Fusi M.D.   On: 05/15/2013 11:40   Ct Cervical Spine Wo Contrast  05/15/2013   CLINICAL DATA:  Fall from height. Trauma to the head. Bleeding from the right ear  EXAM: CT HEAD WITHOUT CONTRAST  CT CERVICAL SPINE WITHOUT CONTRAST  TECHNIQUE: Multidetector CT imaging of the head and cervical spine was performed following the standard protocol without intravenous contrast. Multiplanar CT image reconstructions of the cervical spine were also generated.  COMPARISON:  None.  FINDINGS: CT HEAD FINDINGS  The brain has normal appearance without evidence of malformation, atrophy, old or acute infarction, mass lesion, hemorrhage, hydrocephalus or extra-axial collection. No fluid in the visualized sinuses, middle ears or mastoids. No evidence of temporal bone fracture on the right. Extensive soft tissue injury of pinna.  CT CERVICAL SPINE FINDINGS  Alignment is normal. No fracture. No soft tissue swelling. There is ordinary mild spondylosis at C3-4, C4-5 and C5-6 without significant osteophytic encroachment upon the canal or foramina. There is premature atherosclerotic calcification at both carotid bifurcation regions.  IMPRESSION: No skull fracture, temporal bone fracture or intracranial injury. Soft tissue injury to the right ear.  No cervical spine fracture. Ordinary spondylosis. Premature calcific atherosclerosis of the carotid bifurcation regions.   Electronically Signed   By: Paulina Fusi M.D.   On: 05/15/2013 11:35   Ct Abdomen Pelvis W Contrast  05/15/2013   CLINICAL DATA:   Fall from 15 feet.  EXAM: CT CHEST, ABDOMEN, AND PELVIS WITH CONTRAST  TECHNIQUE: Multidetector CT imaging of the chest, abdomen and pelvis was performed following the standard protocol during bolus administration of intravenous contrast.  CONTRAST:  OMNIPAQUE IOHEXOL 300 MG/ML  SOLN  COMPARISON:  None.  FINDINGS: CT CHEST FINDINGS  No pneumothorax or hemothorax. There is atelectasis of the dependent lung bilaterally, most pronounced in the lower lobes. No rib or spinal fracture is seen. No sternal fracture.  No evidence  of mediastinal bleeding. Premature coronary artery calcification is present.  CT ABDOMEN AND PELVIS FINDINGS  The liver has a normal appearance without focal lesions or traumatic finding. No calcified gallstones. The spleen is normal. The pancreas is normal. The adrenal glands are normal. Kidneys are normal. The aorta shows atherosclerosis but no aneurysm. The IVC is normal. Bladder, prostate gland and seminal vesicles are normal. No evidence of fracture of the lumbar spine or pelvis. Ordinary degenerative changes are noted.  IMPRESSION: Dependent atelectasis in both lungs. No traumatic finding in the chest. Question aspiration.  No traumatic finding in the abdomen or pelvis.  Premature atherosclerosis of the coronary arteries and aorta .   Electronically Signed   By: Paulina Fusi M.D.   On: 05/15/2013 11:40   Dg Pelvis Portable  05/15/2013   CLINICAL DATA:  Fall.  Pain.  EXAM: PORTABLE PELVIS 1-2 VIEWS  COMPARISON:  None.  FINDINGS: There is no evidence of pelvic fracture or diastasis. No other pelvic bone lesions are seen.  IMPRESSION: Negative.   Electronically Signed   By: Amie Portland M.D.   On: 05/15/2013 11:40   Dg Chest Portable 1 View  05/15/2013   CLINICAL DATA:  Followup.  Pain.  EXAM: PORTABLE CHEST - 1 VIEW  COMPARISON:  03/22/2007  FINDINGS: There is a fracture of the mid right clavicle, oblique, with mild displacement of approximately 6 mm.  No rib fractures.  Cardiac  silhouette is normal in size. Normal mediastinal and hilar contours. Clear lungs. No pleural effusion or pneumothorax.  IMPRESSION: 1. Fractured right clavicle. 2. No acute cardiopulmonary disease.   Electronically Signed   By: Amie Portland M.D.   On: 05/15/2013 11:38     EKG Interpretation   Date/Time:  Friday May 15 2013 10:24:23 EDT Ventricular Rate:  129 PR Interval:  139 QRS Duration: 93 QT Interval:  299 QTC Calculation: 438 R Axis:   40 Text Interpretation:  Sinus tachycardia Probable anteroseptal infarct, old  Minimal ST depression, lateral leads ED PHYSICIAN INTERPRETATION AVAILABLE  IN [facility] Confirmed by TEST, Record ([id number]) on 05/17/2013 9:01:13  AM      MDM   Final diagnoses:  Head injury, closed, with concussion  Laceration of right pinna  Right clavicle fracture  Chest wall contusion  Radius/ulna fracture    Pt is a 48 y.o. male with Pmhx as above who presents as level II trauma after a 15 foot fall through a roof at a home construction site, landing on his head. No definate LOC.  EMS reported he was initially GCS 14, repetitive, though was GCS 15 upon arrival. ABC intact. On secondary survery, pt found to have laceration of R pinna, R clavicle deformity, though was complaining of R sided chest pain.  Mechanism of injury, full trauma scans ordered and pt found to have only dependent atelectasis in lung bases and R clavicle fracture.  Complex ear laceration repaired and pressure dressing applied.  ENT consulted to arrange f/u and will be seen in the office on Monday. Family instructed on reapplying pressure dressing. Pt was ambulated w/o difficulty and was able to tolerate PO. Just prior to d/c, he noticed R wrist pain and R wrist film added. Dr. Jodi Mourning to f/u on wrist films.         Shanna Cisco, MD 05/17/13 [phone]

## 2013-05-15 NOTE — Progress Notes (Signed)
Orthopedic Tech Progress Note Patient Details:  Elisabeth PigeonDaniel W Van 1966-01-31 [id number]  Patient ID: Elisabeth Pigeonaniel W Kulkarni, male   DOB: 1966-01-31, 48 y.o.   MRN: [id number]   Mickie BailJennifer Carol Cammer 05/15/2013, 10:26 AMLevel 2 Trauma, fall

## 2013-05-15 NOTE — ED Notes (Signed)
Pt transported to CT scan on monitor with RN.

## 2013-05-15 NOTE — Progress Notes (Signed)
Chaplain responded to level two trauma. No family present at this time. 

## 2013-05-15 NOTE — ED Notes (Signed)
Pt is able to walk independently without assistance

## 2013-05-16 NOTE — ED Notes (Signed)
Trauma ended at 1853. Unable to chart it due to accidentally charting the trauma outcome first.

## 2013-05-16 NOTE — ED Notes (Addendum)
Trauma end. Unable to chart in Trauma narrator due to accidentally charting trauma outcome first.

## 2013-05-16 NOTE — ED Provider Notes (Signed)
Signed out with plan to follow up xray of ulna/ radius then discharge.  Xray showed mild comminuted fracture without significant angulation, reviewed. Dg Clavicle Right  05/15/2013   CLINICAL DATA:  Fall.  Right clavicle region pain.  EXAM: RIGHT CLAVICLE - 2+ VIEWS  COMPARISON:  None.  FINDINGS: There is an oblique fracture of the mid right clavicle, which is non comminuted. Distal fracture is displaced laterally by 11 mm. No fracture angulation.  No other fractures. AC joint and glenohumeral joints are normally aligned. Underlying ribs are intact.  IMPRESSION: 1. Fracture of the mid right clavicle as described.   Electronically Signed   By: Amie Portland M.D.   On: 05/15/2013 11:39   Dg Shoulder Right  05/15/2013   CLINICAL DATA:  Fall.  Right shoulder pain.  EXAM: RIGHT SHOULDER - 2+ VIEW  COMPARISON:  None.  FINDINGS: There is a fracture of the mid right clavicle, without comminution or significant displacement.  No other fractures. Glenohumeral joint and AC joints are normally aligned. The underlying ribs are intact.  IMPRESSION: Fracture of the mid right clavicle, without significant displacement. No other fractures. No dislocation.   Electronically Signed   By: Amie Portland M.D.   On: 05/15/2013 11:37   Dg Wrist Complete Right  05/15/2013   CLINICAL DATA:  Patient fell through attic roof on to the ground with trauma to right wrist  EXAM: RIGHT WRIST - COMPLETE 3+ VIEW  COMPARISON:  None.  FINDINGS: There is a horizontal fracture through the distal radial metaphysis. Fracture is mildly comminuted, with mild dorsal displacement of the dorsal fracture component there is nondisplaced hairline fracture through the ulnar epiphysis. All  IMPRESSION: Fractures of the distal radius and ulna   Electronically Signed   By: Esperanza Heir M.D.   On: 05/15/2013 16:49   Ct Head Wo Contrast  05/15/2013   CLINICAL DATA:  Fall from height. Trauma to the head. Bleeding from the right ear  EXAM: CT HEAD WITHOUT  CONTRAST  CT CERVICAL SPINE WITHOUT CONTRAST  TECHNIQUE: Multidetector CT imaging of the head and cervical spine was performed following the standard protocol without intravenous contrast. Multiplanar CT image reconstructions of the cervical spine were also generated.  COMPARISON:  None.  FINDINGS: CT HEAD FINDINGS  The brain has normal appearance without evidence of malformation, atrophy, old or acute infarction, mass lesion, hemorrhage, hydrocephalus or extra-axial collection. No fluid in the visualized sinuses, middle ears or mastoids. No evidence of temporal bone fracture on the right. Extensive soft tissue injury of pinna.  CT CERVICAL SPINE FINDINGS  Alignment is normal. No fracture. No soft tissue swelling. There is ordinary mild spondylosis at C3-4, C4-5 and C5-6 without significant osteophytic encroachment upon the canal or foramina. There is premature atherosclerotic calcification at both carotid bifurcation regions.  IMPRESSION: No skull fracture, temporal bone fracture or intracranial injury. Soft tissue injury to the right ear.  No cervical spine fracture. Ordinary spondylosis. Premature calcific atherosclerosis of the carotid bifurcation regions.   Electronically Signed   By: Paulina Fusi M.D.   On: 05/15/2013 11:35   Ct Chest W Contrast  05/15/2013   CLINICAL DATA:  Fall from 15 feet.  EXAM: CT CHEST, ABDOMEN, AND PELVIS WITH CONTRAST  TECHNIQUE: Multidetector CT imaging of the chest, abdomen and pelvis was performed following the standard protocol during bolus administration of intravenous contrast.  CONTRAST:  OMNIPAQUE IOHEXOL 300 MG/ML  SOLN  COMPARISON:  None.  FINDINGS: CT CHEST FINDINGS  No  pneumothorax or hemothorax. There is atelectasis of the dependent lung bilaterally, most pronounced in the lower lobes. No rib or spinal fracture is seen. No sternal fracture.  No evidence of mediastinal bleeding. Premature coronary artery calcification is present.  CT ABDOMEN AND PELVIS FINDINGS   The liver has a normal appearance without focal lesions or traumatic finding. No calcified gallstones. The spleen is normal. The pancreas is normal. The adrenal glands are normal. Kidneys are normal. The aorta shows atherosclerosis but no aneurysm. The IVC is normal. Bladder, prostate gland and seminal vesicles are normal. No evidence of fracture of the lumbar spine or pelvis. Ordinary degenerative changes are noted.  IMPRESSION: Dependent atelectasis in both lungs. No traumatic finding in the chest. Question aspiration.  No traumatic finding in the abdomen or pelvis.  Premature atherosclerosis of the coronary arteries and aorta .   Electronically Signed   By: Paulina FusiMark  Shogry M.D.   On: 05/15/2013 11:40   Ct Cervical Spine Wo Contrast  05/15/2013   CLINICAL DATA:  Fall from height. Trauma to the head. Bleeding from the right ear  EXAM: CT HEAD WITHOUT CONTRAST  CT CERVICAL SPINE WITHOUT CONTRAST  TECHNIQUE: Multidetector CT imaging of the head and cervical spine was performed following the standard protocol without intravenous contrast. Multiplanar CT image reconstructions of the cervical spine were also generated.  COMPARISON:  None.  FINDINGS: CT HEAD FINDINGS  The brain has normal appearance without evidence of malformation, atrophy, old or acute infarction, mass lesion, hemorrhage, hydrocephalus or extra-axial collection. No fluid in the visualized sinuses, middle ears or mastoids. No evidence of temporal bone fracture on the right. Extensive soft tissue injury of pinna.  CT CERVICAL SPINE FINDINGS  Alignment is normal. No fracture. No soft tissue swelling. There is ordinary mild spondylosis at C3-4, C4-5 and C5-6 without significant osteophytic encroachment upon the canal or foramina. There is premature atherosclerotic calcification at both carotid bifurcation regions.  IMPRESSION: No skull fracture, temporal bone fracture or intracranial injury. Soft tissue injury to the right ear.  No cervical spine fracture.  Ordinary spondylosis. Premature calcific atherosclerosis of the carotid bifurcation regions.   Electronically Signed   By: Paulina FusiMark  Shogry M.D.   On: 05/15/2013 11:35   Ct Abdomen Pelvis W Contrast  05/15/2013   CLINICAL DATA:  Fall from 15 feet.  EXAM: CT CHEST, ABDOMEN, AND PELVIS WITH CONTRAST  TECHNIQUE: Multidetector CT imaging of the chest, abdomen and pelvis was performed following the standard protocol during bolus administration of intravenous contrast.  CONTRAST:  100mL OMNIPAQUE IOHEXOL 300 MG/ML  SOLN  COMPARISON:  None.  FINDINGS: CT CHEST FINDINGS  No pneumothorax or hemothorax. There is atelectasis of the dependent lung bilaterally, most pronounced in the lower lobes. No rib or spinal fracture is seen. No sternal fracture.  No evidence of mediastinal bleeding. Premature coronary artery calcification is present.  CT ABDOMEN AND PELVIS FINDINGS  The liver has a normal appearance without focal lesions or traumatic finding. No calcified gallstones. The spleen is normal. The pancreas is normal. The adrenal glands are normal. Kidneys are normal. The aorta shows atherosclerosis but no aneurysm. The IVC is normal. Bladder, prostate gland and seminal vesicles are normal. No evidence of fracture of the lumbar spine or pelvis. Ordinary degenerative changes are noted.  IMPRESSION: Dependent atelectasis in both lungs. No traumatic finding in the chest. Question aspiration.  No traumatic finding in the abdomen or pelvis.  Premature atherosclerosis of the coronary arteries and aorta .   Electronically  Signed   By: Paulina Fusi M.D.   On: 05/15/2013 11:40   Dg Pelvis Portable  05/15/2013   CLINICAL DATA:  Fall.  Pain.  EXAM: PORTABLE PELVIS 1-2 VIEWS  COMPARISON:  None.  FINDINGS: There is no evidence of pelvic fracture or diastasis. No other pelvic bone lesions are seen.  IMPRESSION: Negative.   Electronically Signed   By: Amie Portland M.D.   On: 05/15/2013 11:40   Dg Chest Portable 1 View  05/15/2013    CLINICAL DATA:  Followup.  Pain.  EXAM: PORTABLE CHEST - 1 VIEW  COMPARISON:  03/22/2007  FINDINGS: There is a fracture of the mid right clavicle, oblique, with mild displacement of approximately 6 mm.  No rib fractures.  Cardiac silhouette is normal in size. Normal mediastinal and hilar contours. Clear lungs. No pleural effusion or pneumothorax.  IMPRESSION: 1. Fractured right clavicle. 2. No acute cardiopulmonary disease.   Electronically Signed   By: Amie Portland M.D.   On: 05/15/2013 11:38    Splint placed.  NV intact on recheck, pain meds given. Fup with ortho discussed. Filed Vitals:   05/15/13 1415 05/15/13 1429 05/15/13 1430 05/15/13 1851  BP: 149/77  142/85 145/87  Pulse: 117  123 104  Temp:    100.4 F (38 C)  TempSrc:    Oral  Resp: 17  22 18   Height:  5\' 9"  (1.753 m)    Weight:  275 lb (124.739 kg)    SpO2: 95%  96% 94%    Langley Adie, MD 05/16/13 [phone]

## 2013-05-16 NOTE — ED Notes (Signed)
Trauma end at 1853. Unable to chart it in trauma narrator due to charting trauma outcome first in the trauma narrator.

## 2013-05-22 ENCOUNTER — Ambulatory Visit: Payer: PRIVATE HEALTH INSURANCE | Admitting: Internal Medicine

## 2013-06-04 ENCOUNTER — Encounter: Payer: Self-pay | Admitting: Internal Medicine

## 2013-06-04 ENCOUNTER — Ambulatory Visit (INDEPENDENT_AMBULATORY_CARE_PROVIDER_SITE_OTHER): Payer: No Typology Code available for payment source | Admitting: Internal Medicine

## 2013-06-04 VITALS — BP 148/92 | HR 94 | Temp 97.9°F | Wt 265.0 lb

## 2013-06-04 DIAGNOSIS — F329 Major depressive disorder, single episode, unspecified: Secondary | ICD-10-CM

## 2013-06-04 DIAGNOSIS — F419 Anxiety disorder, unspecified: Principal | ICD-10-CM

## 2013-06-04 DIAGNOSIS — F1010 Alcohol abuse, uncomplicated: Secondary | ICD-10-CM

## 2013-06-04 DIAGNOSIS — F341 Dysthymic disorder: Secondary | ICD-10-CM

## 2013-06-04 MED ORDER — ESCITALOPRAM OXALATE 10 MG PO TABS: ORAL_TABLET | ORAL | Status: DC

## 2013-06-04 NOTE — Patient Instructions (Addendum)
Re start lexapro Come back in 6 weeks   [facility] [address], [place], [place] [address]  [phone]  [facility] [address], [place], [place] [address] [phone]

## 2013-06-04 NOTE — Progress Notes (Signed)
Subjective:    Patient ID: Ricky PigeonDaniel W Molina, male    DOB: 06-08-1965, 48 y.o.   MRN: [id number]  DOS:  06/04/2013 Type of  visit: Acute visit Self discontinue Lexapro approximately 3 months ago as he felt it wasn't helping, did not feel any different after he discontinue the medication. Approximately 2 months ago he started to drink alcohol again and since then he abuses it on and off.  Went to the ER4-10-15 after a 15 feet fall. Had a  ear laceration, followup by ENT. CBG was 245, EtOH negative  X-rays positive for right mid clavicle fracture and a R  distal ulnar-radial fracture. CTs of the head and cervical spine negative  ROS Admits to anxiety and some depression, no suicidal ideas. As far as the recent falls, denies headaches, nausea, vomiting . Has pain in the fracture sites but no neck pain per se. Has followup with ENT and orthopedic surgery.  Past Medical History  Diagnosis Date  . Hyperlipidemia   . Allergic rhinitis   . OSA on CPAP   . Diverticulitis 03/23/2011    possible  . Alcoholism 03/24/2011  . Hepatic steatosis 03/25/2011  . Ascites     mild on CT 03-23-11  . Anxiety and depression 12/19/2012  . Elevated BP     Past Surgical History  Procedure Laterality Date  . Appendectomy  1997    History   Social History  . Marital Status: Married    Spouse Name: [name]    Number of Children: 2  . Years of Education: N/A   Occupational History  . 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 Smoking status: Former Smoker -- 1.00 packs/day for 30 years    Types: Cigarettes    Quit date: 01/20/2011  . Smokeless tobacco: Never Used  . Alcohol Use: 25.2 oz/week    42 Cans of beer per week     Comment: h/o abuse, no ETOH since [id number]  . Drug Use: No  . Sexual Activity: No   Other Topics Concern  . Not on file   Social History Narrative   Married with 2 sons one daughter.   [employer]   Wife disabled, blind              Medication List       This list is accurate as of: 06/04/13  1:05 PM.  Always use your most recent med list.               calcium carbonate 500 MG chewable tablet  Commonly known as:  TUMS - dosed in mg elemental calcium  Chew 1 tablet by mouth daily as needed. For antacid.     escitalopram 10 MG tablet  Commonly known as:  LEXAPRO  TAKE 1 TABLET BY MOUTH ONCE DAILY     ibuprofen 200 MG tablet  Commonly known as:  ADVIL,MOTRIN  Take 400-800 mg by mouth every 8 (eight) hours as needed. For pain.           Objective:   Physical Exam BP 148/92  Pulse 94  Temp(Src) 97.9 F (36.6 C)  Wt 265 lb (120.203 kg)  SpO2 97%  General -- alert, well-developed, NAD.  Neurologic--  alert & oriented X3. Speech normal, gait normal. Psych-- Cognition and judgment appear intact. Cooperative with normal attention span and concentration. slt  Anxious but no depressed appearing.       Assessment & Plan:   Today , I spent  more than 25  min with the patient, >50% of the time counseling, and /or reviewing the chart and labs ordered by other providers

## 2013-06-04 NOTE — Progress Notes (Signed)
Pre visit review using our clinic review tool, if applicable. No additional management support is needed unless otherwise documented below in the visit note. 

## 2013-06-04 NOTE — Assessment & Plan Note (Signed)
Shortly after he started SSRIs reportedly felt great, then he self discontinue Lexapro because "it wasn't helping". Interestingly he started to drink alcohol again shortly after he discontinue Lexapro. He is anxious and depressed again, no suicidal. Plan: Restart Lexapro, strongly encourage to seek counseling, see alcohol abuse

## 2013-06-04 NOTE — Assessment & Plan Note (Addendum)
Restarted alcohol abuse 2 months ago, on and off  Interestingly in the setting of stopping SSRIs. Probably SSRIs were helping. Reports  the source of his frustration is mostly his relationship with his wife. They're actively looking for outpatient rehabilitation places for alcohol abuse. He tried AA before and states it didn't work. Encouraged to pursue singles and marriage counseling as well as outpatient rehabilitation.

## 2013-06-05 ENCOUNTER — Telehealth: Payer: Self-pay | Admitting: Pulmonary Disease

## 2013-06-05 DIAGNOSIS — G4733 Obstructive sleep apnea (adult) (pediatric): Secondary | ICD-10-CM

## 2013-06-05 NOTE — Telephone Encounter (Signed)
Spoke with pt spouse. Aware will place order. Nothing further needed

## 2013-07-17 ENCOUNTER — Encounter: Payer: Self-pay | Admitting: Internal Medicine

## 2013-07-17 ENCOUNTER — Ambulatory Visit (INDEPENDENT_AMBULATORY_CARE_PROVIDER_SITE_OTHER): Payer: No Typology Code available for payment source | Admitting: Internal Medicine

## 2013-07-17 VITALS — BP 151/88 | HR 91 | Temp 98.7°F | Wt 257.0 lb

## 2013-07-17 DIAGNOSIS — F1010 Alcohol abuse, uncomplicated: Secondary | ICD-10-CM

## 2013-07-17 DIAGNOSIS — E119 Type 2 diabetes mellitus without complications: Secondary | ICD-10-CM

## 2013-07-17 LAB — HEMOGLOBIN A1C: Hgb A1c MFr Bld: 10.3 % — ABNORMAL HIGH (ref 4.6–6.5)

## 2013-07-17 LAB — AST: AST: 104 U/L — AB (ref 0–37)

## 2013-07-17 LAB — ALT: ALT: 132 U/L — AB (ref 0–53)

## 2013-07-17 MED ORDER — ESCITALOPRAM OXALATE 10 MG PO TABS
ORAL_TABLET | ORAL | Status: DC
Start: 2013-07-17 — End: 2013-07-27

## 2013-07-17 NOTE — Patient Instructions (Signed)
Get your blood work before you leave    [facility] web site for Diabetes StagedNews.nohttp://www.[facility].org/diseases-conditions/diabetes/basics/definition/con-20033091  All about diabetes, great resource!  http://www.molina.com/http://www.[facility].org/diabetes-information.html    Next visit is for routine check up in 3 or 4 months, fasting Please make an appointment

## 2013-07-17 NOTE — Progress Notes (Signed)
   Subjective:    Patient ID: Ricky Molina, male    DOB: [birth year], 48 y.o.   MRN: [id number]  DOS:  07/17/2013 Type of  Visit: Followup from previous visit History: Anxiety depression, on Lexapro, good compliance, no apparent side effects, feeling better. Still having some stress due to recent accident, not able to work. Alcohol abuse, attending the [facility] so far with good results   ROS Denies any suicidal ideas. Occasionally difficulty falling asleep but for the most part is doing well. Concerned about his diabetes  Past Medical History  Diagnosis Date  . Hyperlipidemia   . Allergic rhinitis   . OSA on CPAP   . Diverticulitis 03/23/2011    possible  . Alcoholism 03/24/2011  . Hepatic steatosis 03/25/2011  . Ascites     mild on CT 03-23-11  . Anxiety and depression 12/19/2012  . Elevated BP     Past Surgical History  Procedure Laterality Date  . Appendectomy  1997    History   Social History  . Marital Status: Married    Spouse Name: Ricky Molina    Number of Children: 2  . Years of Education: N/A   Occupational History  . 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 Smoking status: Former Smoker -- 1.00 packs/day for 30 years    Types: Cigarettes    Quit date: 01/20/2011  . Smokeless tobacco: Never Used  . Alcohol Use: 25.2 oz/week    42 Cans of beer per week     Comment: h/o abuse, no ETOH recently  . Drug Use: No  . Sexual Activity: No   Other Topics Concern  . Not on file   Social History Narrative   Married with 2 sons one daughter.   [employer]   Wife disabled, blind              Medication List       This list is accurate as of: 07/17/13 11:59 PM.  Always use your most recent med list.               calcium carbonate 500 MG chewable tablet  Commonly known as:  TUMS - dosed in mg elemental calcium  Chew 1 tablet by mouth daily as needed. For antacid.     escitalopram 10 MG tablet  Commonly known as:  LEXAPRO  TAKE 1 TABLET  BY MOUTH ONCE DAILY     ibuprofen 200 MG tablet  Commonly known as:  ADVIL,MOTRIN  Take 400-800 mg by mouth every 8 (eight) hours as needed. For pain.           Objective:   Physical Exam BP 151/88  Pulse 91  Temp(Src) 98.7 F (37.1 C) (Oral)  Wt 257 lb (116.574 kg)  SpO2 95%  General -- alert, well-developed, NAD.   Neurologic--  alert & oriented X3. Speech normal, gait appropriate for age, strength symmetric and appropriate for age.  Psych-- Cognition and judgment appear intact. Cooperative with normal attention span and concentration. No anxious or depressed appearing.       Assessment & Plan:

## 2013-07-17 NOTE — Progress Notes (Signed)
Pre-visit discussion using our clinic review tool. No additional management support is needed unless otherwise documented below in the visit note.  

## 2013-07-17 NOTE — Assessment & Plan Note (Signed)
Concerned about his diabetes, encourage healthy diet and stay active. Encourage self learning, see instructions. Will check a A1c and   AST ALT. BP noted to be slightly elevated, we'll recheck on return to the office

## 2013-07-17 NOTE — Assessment & Plan Note (Signed)
This is the 3 ot 8 week-program at the [facility], no alcohol in the last couple of weeks, feeling well. Patient is praised!

## 2013-07-20 ENCOUNTER — Encounter: Payer: Self-pay | Admitting: *Deleted

## 2013-07-27 ENCOUNTER — Telehealth: Payer: Self-pay | Admitting: Internal Medicine

## 2013-07-27 MED ORDER — ESCITALOPRAM OXALATE 10 MG PO TABS: ORAL_TABLET | ORAL | Status: DC

## 2013-07-27 NOTE — Telephone Encounter (Signed)
90 days sent.

## 2013-07-27 NOTE — Telephone Encounter (Signed)
Caller name: Michel BickersLori Hottel Relation to pt: wife Call back number: [phone] Pharmacy: [facility]  Reason for call: Patient wife called to request a 90 day supply for escitalopram (LEXAPRO) 10 MG tablet

## 2013-08-09 ENCOUNTER — Telehealth: Payer: Self-pay | Admitting: Internal Medicine

## 2013-08-09 NOTE — Telephone Encounter (Signed)
CALL PT: Diabetes not well controlled, needs OV to discuss treatment options, please arrange

## 2013-08-10 NOTE — Telephone Encounter (Signed)
Apt scheduled 08/12/13 at 2:45.     KP

## 2013-08-12 ENCOUNTER — Ambulatory Visit (INDEPENDENT_AMBULATORY_CARE_PROVIDER_SITE_OTHER): Payer: No Typology Code available for payment source | Admitting: Internal Medicine

## 2013-08-12 ENCOUNTER — Encounter: Payer: Self-pay | Admitting: Internal Medicine

## 2013-08-12 VITALS — BP 125/81 | HR 84 | Temp 98.0°F | Wt 250.0 lb

## 2013-08-12 DIAGNOSIS — K7010 Alcoholic hepatitis without ascites: Secondary | ICD-10-CM

## 2013-08-12 DIAGNOSIS — E119 Type 2 diabetes mellitus without complications: Secondary | ICD-10-CM

## 2013-08-12 MED ORDER — GLUCOSE BLOOD VI STRP: ORAL_STRIP | Status: DC

## 2013-08-12 MED ORDER — ONETOUCH ULTRASOFT LANCETS MISC: Status: DC

## 2013-08-12 MED ORDER — GLIMEPIRIDE 2 MG PO TABS: 2.0000 mg | ORAL_TABLET | Freq: Every day | ORAL | Status: DC

## 2013-08-12 NOTE — Progress Notes (Signed)
   Subjective:    Patient ID: Ricky PigeonDaniel W Marter, male    DOB: 12/20/1965, 48 y.o.   MRN: [id number]  DOS:  08/12/2013 Type of visit - description: Followup from previous visit History:  Here to discuss A1c and increased LFTs, see assessment and plan      Past Medical History  Diagnosis Date  . Hyperlipidemia   . Allergic rhinitis   . OSA on CPAP   . Diverticulitis 03/23/2011    possible  . Alcoholism 03/24/2011  . Hepatic steatosis 03/25/2011  . Ascites     mild on CT 03-23-11  . Anxiety and depression 12/19/2012  . Elevated BP   . Diabetes     Past Surgical History  Procedure Laterality Date  . Appendectomy  1997    History   Social History  . Marital Status: Married    Spouse Name: [name]    Number of Children: 2  . Years of Education: N/A   Occupational History  . 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 Smoking status: Former Smoker -- 1.00 packs/day for 30 years    Types: Cigarettes    Quit date: 01/20/2011  . Smokeless tobacco: Never Used  . Alcohol Use: 25.2 oz/week    42 Cans of beer per week     Comment: h/o abuse, no ETOH recently  . Drug Use: No  . Sexual Activity: No   Other Topics Concern  . Not on file   Social History Narrative   Married with 2 sons one daughter.   [employer]   Wife disabled, blind              Medication List       This list is accurate as of: 08/12/13 11:59 PM.  Always use your most recent med list.               calcium carbonate 500 MG chewable tablet  Commonly known as:  TUMS - dosed in mg elemental calcium  Chew 1 tablet by mouth daily as needed. For antacid.     disulfiram 250 MG tablet  Commonly known as:  ANTABUSE     escitalopram 10 MG tablet  Commonly known as:  LEXAPRO  TAKE 1 TABLET BY MOUTH ONCE DAILY     glimepiride 2 MG tablet  Commonly known as:  AMARYL  Take 1 tablet (2 mg total) by mouth daily with breakfast.     glucose blood test strip  Commonly known as:  ONE TOUCH ULTRA TEST    Use as instructed     ibuprofen 200 MG tablet  Commonly known as:  ADVIL,MOTRIN  Take 400-800 mg by mouth every 8 (eight) hours as needed. For pain.     onetouch ultrasoft lancets  Use as instructed           Objective:   Physical Exam BP 125/81  Pulse 84  Temp(Src) 98 F (36.7 C)  Wt 250 lb (113.399 kg)  SpO2 95% General -- alert, well-developed, NAD.  Neurologic--  alert & oriented X3. Speech normal, gait appropriate for age, strength symmetric and appropriate for age.  Psych-- Cognition and judgment appear intact. Cooperative with normal attention span and concentration. No anxious or depressed appearing.      Assessment & Plan:     Today , I spent more than 19   min with the patient: >50% of the time counseling regards Diabetes, see assessment and plan for details

## 2013-08-12 NOTE — Assessment & Plan Note (Addendum)
His A1c increased from around 6 to 10.3. Since he find out that his A1c was elevated, he is doing much better work with diet, has make some significant changes in lost some weight. Also, he was still drinking at the time and since then he has quit alcohol completely. The patient is counseled about what the meaning of A1c, diet, exercise, CBG goals, a glucometer was provided, Hypoglycemia sx  Will start  glymeperide See instructions

## 2013-08-12 NOTE — Patient Instructions (Signed)
Start taking glimepiride one tablet in the morning Check the  blood sugars once a day at different times Watch for low blood sugar symptoms Exercise daily at least 30 minutes Next visit in 3 months, sooner if needed   Two great books to learn about diabetes Diabetes fro Dummies "The [facility] Diabetes diet" book  All about diabetes, great resource!  http://www.molina.com/http://www.joslin.org/diabetes-information.html     Hypoglycemia Hypoglycemia occurs when the glucose in your blood is too low. Glucose is a type of sugar that is your body's main energy source. Hormones, such as insulin and glucagon, control the level of glucose in the blood. Insulin lowers blood glucose and glucagon increases blood glucose. Having too much insulin in your blood stream, or not eating enough food containing sugar, can result in hypoglycemia. Hypoglycemia can happen to people with or without diabetes. It can develop quickly and can be a medical emergency.  CAUSES   Missing or delaying meals.  Not eating enough carbohydrates at meals.  Taking too much diabetes medicine.  Not timing your oral diabetes medicine or insulin doses with meals, snacks, and exercise.  Nausea and vomiting.  Certain medicines.  Severe illnesses, such as hepatitis, kidney disorders, and certain eating disorders.  Increased activity or exercise without eating something extra or adjusting medicines.  Drinking too much alcohol.  A nerve disorder that affects body functions like your heart rate, blood pressure, and digestion (autonomic neuropathy).  A condition where the stomach muscles do not function properly (gastroparesis). Therefore, medicines and food may not absorb properly.  Rarely, a tumor of the pancreas can produce too much insulin. SYMPTOMS   Hunger.  Sweating (diaphoresis).  Change in body temperature.  Shakiness.  Headache.  Anxiety.  Lightheadedness.  Irritability.  Difficulty concentrating.  Dry  mouth.  Tingling or numbness in the hands or feet.  Restless sleep or sleep disturbances.  Altered speech and coordination.  Change in mental status.  Seizures or prolonged convulsions.  Combativeness.  Drowsiness (lethargic).  Weakness.  Increased heart rate or palpitations.  Confusion.  Pale, gray skin color.  Blurred or double vision.  Fainting. DIAGNOSIS  A physical exam and medical history will be performed. Your caregiver may make a diagnosis based on your symptoms. Blood tests and other lab tests may be performed to confirm a diagnosis. Once the diagnosis is made, your caregiver will see if your signs and symptoms go away once your blood glucose is raised.  TREATMENT  Usually, you can easily treat your hypoglycemia when you notice symptoms.  Check your blood glucose. If it is less than 70 mg/dl, take one of the following:   3-4 glucose tablets.    cup juice.    cup regular soda.   1 cup skim milk.   -1 tube of glucose gel.   5-6 hard candies.   Avoid high-fat drinks or food that may delay a rise in blood glucose levels.  Do not take more than the recommended amount of sugary foods, drinks, gel, or tablets. Doing so will cause your blood glucose to go too high.   Wait 10-15 minutes and recheck your blood glucose. If it is still less than 70 mg/dl or below your target range, repeat treatment.   Eat a snack if it is more than 1 hour until your next meal.  There may be a time when your blood glucose may go so low that you are unable to treat yourself at home when you start to notice symptoms. You may need  someone to help you. You may even faint or be unable to swallow. If you cannot treat yourself, someone will need to bring you to the hospital.  HOME CARE INSTRUCTIONS  If you have diabetes, follow your diabetes management plan by:  Taking your medicines as directed.  Following your exercise plan.  Following your meal plan. Do not skip  meals. Eat on time.  Testing your blood glucose regularly. Check your blood glucose before and after exercise. If you exercise longer or different than usual, be sure to check blood glucose more frequently.  Wearing your medical alert jewelry that says you have diabetes.  Identify the cause of your hypoglycemia. Then, develop ways to prevent the recurrence of hypoglycemia.  Do not take a hot bath or shower right after an insulin shot.  Always carry treatment with you. Glucose tablets are the easiest to carry.  If you are going to drink alcohol, drink it only with meals.  Tell friends or family members ways to keep you safe during a seizure. This may include removing hard or sharp objects from the area or turning you on your side.  Maintain a healthy weight. SEEK MEDICAL CARE IF:   You are having problems keeping your blood glucose in your target range.  You are having frequent episodes of hypoglycemia.  You feel you might be having side effects from your medicines.  You are not sure why your blood glucose is dropping so low.  You notice a change in vision or a new problem with your vision. SEEK IMMEDIATE MEDICAL CARE IF:   Confusion develops.  A change in mental status occurs.  The inability to swallow develops.  Fainting occurs. Document Released: 01/22/2005 Document Revised: 01/27/2013 Document Reviewed: 05/21/2011 [facility] Patient Information 2015 [place], [place]. This information is not intended to replace advice given to you by your health care provider. Make sure you discuss any questions you have with your health care provider.

## 2013-08-12 NOTE — Progress Notes (Signed)
Pre visit review using our clinic review tool, if applicable. No additional management support is needed unless otherwise documented below in the visit note. 

## 2013-08-12 NOTE — Assessment & Plan Note (Signed)
Last LFTs were elevated, today he reports that at the time he was drinking ETOH. Plan: labs on return to the office

## 2013-08-21 ENCOUNTER — Telehealth: Payer: Self-pay

## 2013-08-21 DIAGNOSIS — E785 Hyperlipidemia, unspecified: Secondary | ICD-10-CM

## 2013-08-21 NOTE — Telephone Encounter (Signed)
Diabetic bundle  Left a detailed message on patients vm stating that we would like for him to call the office and schedule for a lab appt at his earliest convenience for LDL.   Lipid panel ordered

## 2013-08-25 NOTE — Telephone Encounter (Signed)
Noted  

## 2013-11-18 ENCOUNTER — Encounter: Payer: Self-pay | Admitting: Internal Medicine

## 2013-11-18 ENCOUNTER — Ambulatory Visit (INDEPENDENT_AMBULATORY_CARE_PROVIDER_SITE_OTHER): Payer: No Typology Code available for payment source | Admitting: Internal Medicine

## 2013-11-18 VITALS — BP 164/78 | HR 78 | Temp 98.0°F | Wt 261.1 lb

## 2013-11-18 DIAGNOSIS — K760 Fatty (change of) liver, not elsewhere classified: Secondary | ICD-10-CM

## 2013-11-18 DIAGNOSIS — Z23 Encounter for immunization: Secondary | ICD-10-CM

## 2013-11-18 DIAGNOSIS — K7010 Alcoholic hepatitis without ascites: Secondary | ICD-10-CM

## 2013-11-18 DIAGNOSIS — F1010 Alcohol abuse, uncomplicated: Secondary | ICD-10-CM

## 2013-11-18 DIAGNOSIS — F419 Anxiety disorder, unspecified: Secondary | ICD-10-CM

## 2013-11-18 DIAGNOSIS — E119 Type 2 diabetes mellitus without complications: Secondary | ICD-10-CM

## 2013-11-18 DIAGNOSIS — F329 Major depressive disorder, single episode, unspecified: Secondary | ICD-10-CM

## 2013-11-18 DIAGNOSIS — F418 Other specified anxiety disorders: Secondary | ICD-10-CM

## 2013-11-18 LAB — COMPREHENSIVE METABOLIC PANEL
ALK PHOS: 157 U/L — AB (ref 39–117)
ALT: 27 U/L (ref 0–53)
AST: 38 U/L — AB (ref 0–37)
Albumin: 3.4 g/dL — ABNORMAL LOW (ref 3.5–5.2)
BILIRUBIN TOTAL: 1.7 mg/dL — AB (ref 0.2–1.2)
BUN: 10 mg/dL (ref 6–23)
CO2: 24 mEq/L (ref 19–32)
Calcium: 9.3 mg/dL (ref 8.4–10.5)
Chloride: 103 mEq/L (ref 96–112)
Creatinine, Ser: 0.7 mg/dL (ref 0.4–1.5)
GFR: 136.64 mL/min (ref >60.00)
Glucose, Bld: 171 mg/dL — ABNORMAL HIGH (ref 70–99)
POTASSIUM: 3.9 meq/L (ref 3.5–5.1)
SODIUM: 135 meq/L (ref 135–145)
TOTAL PROTEIN: 7.6 g/dL (ref 6.0–8.3)

## 2013-11-18 LAB — LIPID PANEL
CHOL/HDL RATIO: 3
Cholesterol: 218 mg/dL — ABNORMAL HIGH (ref 0–200)
HDL: 68.7 mg/dL (ref >39.00)
LDL CALC: 127 mg/dL — AB (ref 0–99)
NonHDL: 149.3
Triglycerides: 110 mg/dL (ref 0.0–149.0)
VLDL: 22 mg/dL (ref 0.0–40.0)

## 2013-11-18 LAB — HEMOGLOBIN A1C: Hgb A1c MFr Bld: 8.1 % — ABNORMAL HIGH (ref 4.6–6.5)

## 2013-11-18 LAB — MICROALBUMIN / CREATININE URINE RATIO
Creatinine,U: 208.5 mg/dL
MICROALB UR: 1.3 mg/dL (ref 0.0–1.9)
Microalb Creat Ratio: 0.6 mg/g (ref 0.0–30.0)

## 2013-11-18 MED ORDER — ESCITALOPRAM OXALATE 20 MG PO TABS: 20.0000 mg | ORAL_TABLET | Freq: Every day | ORAL | Status: DC

## 2013-11-18 NOTE — Assessment & Plan Note (Signed)
doing well, not drinking at this time, praised!

## 2013-11-18 NOTE — Assessment & Plan Note (Signed)
Continue w/  some anxiety, patient and his wife think he'll benefit for increased Lexapro,   increase it to 20 mg.

## 2013-11-18 NOTE — Assessment & Plan Note (Signed)
Has not been drinking for months, check LFTs.

## 2013-11-18 NOTE — Progress Notes (Signed)
Pre visit review using our clinic review tool, if applicable. No additional management support is needed unless otherwise documented below in the visit note. 

## 2013-11-18 NOTE — Progress Notes (Signed)
Subjective:    Patient ID: Ricky PigeonDaniel W Molina, male    DOB: 05-30-1965, 48 y.o.   MRN: [id number]  DOS:  11/18/2013 Type of visit - description : f/u Interval history: Diabetes, good medication compliance, CBGs have decreased from 260 on average the 180s. Alcohol abuse, continue to be sober, antabuse dose was increased. Increase Lexapro?Marland Kitchen. Under stress, currently not working but is about to start a new job.Still some anxiety, denies any suicidal ideas.  Also ~ 2 weeks ago for about 48 hours he felt "weird" on and off, felt sweaty, absent. Denies any associated palpitations, chest pain, difficulty breathing. No slurred speech or confusion. He thinks this was releated to  emotional distress. Ambulatory blood sugars were not checked during that period.  ROS See HPI  Past Medical History  Diagnosis Date  . Hyperlipidemia   . Allergic rhinitis   . OSA on CPAP   . Diverticulitis 03/23/2011    possible  . Alcoholism 03/24/2011  . Hepatic steatosis 03/25/2011  . Ascites     mild on CT 03-23-11  . Anxiety and depression 12/19/2012  . Elevated BP   . Diabetes     Past Surgical History  Procedure Laterality Date  . Appendectomy  1997    History   Social History  . Marital Status: Married    Spouse Name: [name]    Number of Children: 2  . Years of Education: N/A   Occupational History  . 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 Smoking status: Former Smoker -- 1.00 packs/day for 30 years    Types: Cigarettes    Quit date: 01/20/2011  . Smokeless tobacco: Never Used  . Alcohol Use: 25.2 oz/week    42 Cans of beer per week     Comment: h/o abuse, no ETOH recently  . Drug Use: No  . Sexual Activity: No   Other Topics Concern  . Not on file   Social History Narrative   Married with 2 sons one daughter.   Surveyor, mineralsHVAC mechanic, not working   Wife disabled, blind                 Medication List       This list is accurate as of: 11/18/13   6:33 PM.  Always use your most recent med list.               ANTABUSE 500 MG Tabs  Generic drug:  Disulfiram  Take 1 tablet by mouth daily.     calcium carbonate 500 MG chewable tablet  Commonly known as:  TUMS - dosed in mg elemental calcium  Chew 1 tablet by mouth daily as needed. For antacid.     escitalopram 20 MG tablet  Commonly known as:  LEXAPRO  Take 1 tablet (20 mg total) by mouth daily.     glimepiride 2 MG tablet  Commonly known as:  AMARYL  Take 1 tablet (2 mg total) by mouth daily with breakfast.     glucose blood test strip  Commonly known as:  ONE TOUCH ULTRA TEST  Use as instructed     ibuprofen 200 MG tablet  Commonly known as:  ADVIL,MOTRIN  Take 400-800 mg by mouth every 8 (eight) hours as needed. For pain.     onetouch ultrasoft lancets  Use as instructed           Objective:   Physical Exam BP 164/78  Pulse 78  Temp(Src) 98 F (36.7 C) (Oral)  Wt 261 lb 2 oz (118.446 kg)  SpO2 95% General -- alert, well-developed, NAD.   HEENT-- Not pale.   Lungs -- normal respiratory effort, no intercostal retractions, no accessory muscle use, and normal breath sounds.  Heart-- normal rate, regular rhythm, no murmur. Extremities-- no pretibial edema bilaterally  Neurologic--  alert & oriented X3. Speech normal, gait appropriate for age, strength symmetric and appropriate for age.  Psych-- Cognition and judgment appear intact. Cooperative with normal attention span and concentration. dlt  anxious , no depressed appearing.      Assessment & Plan:   Episodes of feeling weird: Unclear etiology, no associated cardiopulmonary symptoms, low blood sugar? Recommend to call if episodes continue happening , check a CBG  when symptomatic and carry glucose with him

## 2013-11-18 NOTE — Assessment & Plan Note (Addendum)
Currently on glimepiride only, CBGs has decrease, check A1c, further advice would result . if LFTs normalize consider metformin  Or others  BP slightly elevated, recommend to monitor BPs, see instructions

## 2013-11-18 NOTE — Patient Instructions (Signed)
Get your blood work before you leave     Check the  blood pressure 2 or 3 times a   Week  Be sure your blood pressure is between  145/85 and 110/65.  if it is consistently higher or lower, let me know    Diabetics: Check your blood sugar daily  at different times GOALS: Fasting before a meal 70- 130 2 hours after a meal less than 180 At bedtime 90-150 Call if consistently not at goal ---- Watch for low sugars !    Please come back to the office in 3 months for a routine check up , no  fasting     Hypoglycemia Hypoglycemia occurs when the glucose in your blood is too low. Glucose is a type of sugar that is your body's main energy source. Hormones, such as insulin and glucagon, control the level of glucose in the blood. Insulin lowers blood glucose and glucagon increases blood glucose. Having too much insulin in your blood stream, or not eating enough food containing sugar, can result in hypoglycemia. Hypoglycemia can happen to people with or without diabetes. It can develop quickly and can be a medical emergency.  CAUSES   Missing or delaying meals.  Not eating enough carbohydrates at meals.  Taking too much diabetes medicine.  Not timing your oral diabetes medicine or insulin doses with meals, snacks, and exercise.  Nausea and vomiting.  Certain medicines.  Severe illnesses, such as hepatitis, kidney disorders, and certain eating disorders.  Increased activity or exercise without eating something extra or adjusting medicines.  Drinking too much alcohol.  A nerve disorder that affects body functions like your heart rate, blood pressure, and digestion (autonomic neuropathy).  A condition where the stomach muscles do not function properly (gastroparesis). Therefore, medicines and food may not absorb properly.  Rarely, a tumor of the pancreas can produce too much insulin. SYMPTOMS   Hunger.  Sweating (diaphoresis).  Change in body  temperature.  Shakiness.  Headache.  Anxiety.  Lightheadedness.  Irritability.  Difficulty concentrating.  Dry mouth.  Tingling or numbness in the hands or feet.  Restless sleep or sleep disturbances.  Altered speech and coordination.  Change in mental status.  Seizures or prolonged convulsions.  Combativeness.  Drowsiness (lethargic).  Weakness.  Increased heart rate or palpitations.  Confusion.  Pale, gray skin color.  Blurred or double vision.  Fainting. DIAGNOSIS  A physical exam and medical history will be performed. Your caregiver may make a diagnosis based on your symptoms. Blood tests and other lab tests may be performed to confirm a diagnosis. Once the diagnosis is made, your caregiver will see if your signs and symptoms go away once your blood glucose is raised.  TREATMENT  Usually, you can easily treat your hypoglycemia when you notice symptoms.  Check your blood glucose. If it is less than 70 mg/dl, take one of the following:   3-4 glucose tablets.    cup juice.    cup regular soda.   1 cup skim milk.   -1 tube of glucose gel.   5-6 hard candies.   Avoid high-fat drinks or food that may delay a rise in blood glucose levels.  Do not take more than the recommended amount of sugary foods, drinks, gel, or tablets. Doing so will cause your blood glucose to go too high.   Wait 10-15 minutes and recheck your blood glucose. If it is still less than 70 mg/dl or below your target range, repeat treatment.  Eat a snack if it is more than 1 hour until your next meal.  There may be a time when your blood glucose may go so low that you are unable to treat yourself at home when you start to notice symptoms. You may need someone to help you. You may even faint or be unable to swallow. If you cannot treat yourself, someone will need to bring you to the hospital.  HOME CARE INSTRUCTIONS  If you have diabetes, follow your diabetes management  plan by:  Taking your medicines as directed.  Following your exercise plan.  Following your meal plan. Do not skip meals. Eat on time.  Testing your blood glucose regularly. Check your blood glucose before and after exercise. If you exercise longer or different than usual, be sure to check blood glucose more frequently.  Wearing your medical alert jewelry that says you have diabetes.  Identify the cause of your hypoglycemia. Then, develop ways to prevent the recurrence of hypoglycemia.  Do not take a hot bath or shower right after an insulin shot.  Always carry treatment with you. Glucose tablets are the easiest to carry.  If you are going to drink alcohol, drink it only with meals.  Tell friends or family members ways to keep you safe during a seizure. This may include removing hard or sharp objects from the area or turning you on your side.  Maintain a healthy weight. SEEK MEDICAL CARE IF:   You are having problems keeping your blood glucose in your target range.  You are having frequent episodes of hypoglycemia.  You feel you might be having side effects from your medicines.  You are not sure why your blood glucose is dropping so low.  You notice a change in vision or a new problem with your vision. SEEK IMMEDIATE MEDICAL CARE IF:   Confusion develops.  A change in mental status occurs.  The inability to swallow develops.  Fainting occurs. Document Released: 01/22/2005 Document Revised: 01/27/2013 Document Reviewed: 05/21/2011 [facility] Patient Information 2015 [place], [place]. This information is not intended to replace advice given to you by your health care provider. Make sure you discuss any questions you have with your health care provider.

## 2013-11-24 ENCOUNTER — Other Ambulatory Visit: Payer: Self-pay

## 2013-11-24 MED ORDER — GLIMEPIRIDE 4 MG PO TABS: ORAL_TABLET | ORAL | Status: DC

## 2013-11-24 NOTE — Addendum Note (Signed)
Addended by: Dorette GrateFAULKNER, Legend Tumminello C on: 11/24/2013 09:47 AM   Modules accepted: Orders, Medications

## 2013-12-31 ENCOUNTER — Encounter (HOSPITAL_COMMUNITY): Payer: Self-pay | Admitting: Emergency Medicine

## 2013-12-31 ENCOUNTER — Emergency Department (HOSPITAL_COMMUNITY)
Admission: EM | Admit: 2013-12-31 | Discharge: 2013-12-31 | Disposition: A | Discharge status: Home / Self Care | Payer: Self-pay | Attending: Emergency Medicine | Admitting: Emergency Medicine

## 2013-12-31 ENCOUNTER — Emergency Department (HOSPITAL_COMMUNITY): Payer: Self-pay

## 2013-12-31 DIAGNOSIS — G4733 Obstructive sleep apnea (adult) (pediatric): Secondary | ICD-10-CM | POA: Insufficient documentation

## 2013-12-31 DIAGNOSIS — F329 Major depressive disorder, single episode, unspecified: Secondary | ICD-10-CM | POA: Insufficient documentation

## 2013-12-31 DIAGNOSIS — Z87891 Personal history of nicotine dependence: Secondary | ICD-10-CM | POA: Insufficient documentation

## 2013-12-31 DIAGNOSIS — M79673 Pain in unspecified foot: Secondary | ICD-10-CM

## 2013-12-31 DIAGNOSIS — B070 Plantar wart: Secondary | ICD-10-CM | POA: Insufficient documentation

## 2013-12-31 DIAGNOSIS — F419 Anxiety disorder, unspecified: Secondary | ICD-10-CM | POA: Insufficient documentation

## 2013-12-31 DIAGNOSIS — Z79899 Other long term (current) drug therapy: Secondary | ICD-10-CM | POA: Insufficient documentation

## 2013-12-31 DIAGNOSIS — Z8719 Personal history of other diseases of the digestive system: Secondary | ICD-10-CM | POA: Insufficient documentation

## 2013-12-31 DIAGNOSIS — Z9981 Dependence on supplemental oxygen: Secondary | ICD-10-CM | POA: Insufficient documentation

## 2013-12-31 DIAGNOSIS — Z794 Long term (current) use of insulin: Secondary | ICD-10-CM | POA: Insufficient documentation

## 2013-12-31 DIAGNOSIS — E119 Type 2 diabetes mellitus without complications: Secondary | ICD-10-CM | POA: Insufficient documentation

## 2013-12-31 LAB — CBG MONITORING, ED: Glucose-Capillary: 166 mg/dL — ABNORMAL HIGH (ref 70–99)

## 2013-12-31 MED ORDER — KETOROLAC TROMETHAMINE 60 MG/2ML IM SOLN
60.0000 mg | Freq: Once | INTRAMUSCULAR | Status: AC
  Administered 2013-12-31: 60 mg via INTRAMUSCULAR
  Filled 2013-12-31: qty 2

## 2013-12-31 NOTE — ED Notes (Signed)
Pt transported to xray at this time

## 2013-12-31 NOTE — ED Provider Notes (Signed)
CSN: [id number]     Arrival date & time 12/31/13  1757 History   First MD Initiated Contact with Patient 12/31/13 1828     Chief Complaint  Patient presents with  . Foot Injury     (Consider location/radiation/quality/duration/timing/severity/associated sxs/prior Treatment) HPI  Ricky Molina is a 48 y.o. male with PMH of DM, hyperlipidemia, hepatic steatosis, anxiety, depression presenting with one-week history of right foot soreness with small dark lesion on bottom of his foot. Worse with ambulation. Patient states pain has been persistent but not worsening or improving. Patient denies numbness, tingling, weakness. Spoke with his sister who is a Engineer, civil (consulting)nurse and thought the foot was swollen and urged him to come to the ED. Pt has taken ibuprofen with improvement of his symptoms. Pt denies injury. Pt diabetic, diagnosed last year. Takes glimepiride. States sugars have been running 110-130s. Pt denies fevers, chills, nausea, vomiting.   PCP Willow OraJose Paz   Past Medical History  Diagnosis Date  . Hyperlipidemia   . Allergic rhinitis   . OSA on CPAP   . Diverticulitis 03/23/2011    possible  . Alcoholism 03/24/2011  . Hepatic steatosis 03/25/2011  . Ascites     mild on CT 03-23-11  . Anxiety and depression 12/19/2012  . Elevated BP   . Diabetes    Past Surgical History  Procedure Laterality Date  . Appendectomy  1997   Family History  Problem Relation Age of Onset  . Emphysema Mother   . Hypertension Mother   . Skin cancer Mother     nose  . Breast cancer Mother   . Alcohol abuse Mother   . Breast cancer Mother   . Allergies Sister   . Heart failure Father   . Stroke Father   . Rheum arthritis Father   . Diabetes Father   . Alcohol abuse Father   . Arthritis Father   . Hyperlipidemia Father   . Heart disease Father   . Hypertension Father   . Hypothyroidism Sister    History  Substance Use Topics  . Smoking status: Former Smoker -- 1.00 packs/day for 30 years    Types:  Cigarettes    Quit date: 01/20/2011  . Smokeless tobacco: Never Used  . Alcohol Use: 25.2 oz/week    42 Cans of beer per week     Comment: h/o abuse, no ETOH recently    Review of Systems  Constitutional: Negative for fever and chills.  Gastrointestinal: Negative for nausea, vomiting and diarrhea.  Musculoskeletal: Negative for gait problem.  Skin: Negative for rash.  Neurological: Negative for weakness.      Allergies  Morphine and related  Home Medications   Prior to Admission medications   Medication Sig Start Date End Date Taking? Authorizing Provider  Disulfiram (ANTABUSE) 500 MG TABS Take 1 tablet by mouth daily.   Yes Historical Provider, MD  escitalopram (LEXAPRO) 20 MG tablet Take 1 tablet (20 mg total) by mouth daily. 11/18/13  Yes Wanda PlumpJose E Paz, MD  glimepiride (AMARYL) 4 MG tablet Take 1 and 1/2 tablets daily. 11/24/13  Yes Wanda PlumpJose E Paz, MD  glucose blood (ONE TOUCH ULTRA TEST) test strip Use as instructed 08/12/13  Yes Wanda PlumpJose E Paz, MD  ibuprofen (ADVIL,MOTRIN) 200 MG tablet Take 400 mg by mouth every 8 (eight) hours as needed for moderate pain (pain). For pain.   Yes Historical Provider, MD  Lancets [facility](ONETOUCH ULTRASOFT) lancets Use as instructed 08/12/13  Yes Wanda PlumpJose E Paz, MD  calcium carbonate (  TUMS - DOSED IN MG ELEMENTAL CALCIUM) 500 MG chewable tablet Chew 1 tablet by mouth daily as needed for indigestion (indigestion). For antacid.    Historical Provider, MD   BP 127/57 mmHg  Pulse 90  Temp(Src) 98.5 F (36.9 C) (Oral)  Resp 14  SpO2 94% Physical Exam  Constitutional: He appears well-developed and well-nourished. No distress.  HENT:  Head: Normocephalic and atraumatic.  Eyes: Conjunctivae and EOM are normal. Right eye exhibits no discharge. Left eye exhibits no discharge.  Cardiovascular: Normal rate, regular rhythm and normal heart sounds.   Pulmonary/Chest: Effort normal and breath sounds normal. No respiratory distress. He has no wheezes.  Abdominal: Soft. Bowel  sounds are normal. He exhibits no distension. There is no tenderness.  Musculoskeletal:  Patient with 5 mm circular lesion to right sole lateral side. No erythema. Patient is not raised. Patient tender to palpation. Lesions were. No ulcers. Sensation intact bilaterally. Strength 5 out of 5 and lower extremity. 2+ pedal pulses. Less than 3 second cap refill.  Neurological: He is alert. He exhibits normal muscle tone. Coordination normal.  Skin: Skin is warm and dry. He is not diaphoretic.  Nursing note and vitals reviewed.   ED Course  Procedures (including critical care time) Labs Review Labs Reviewed  CBG MONITORING, ED - Abnormal; Notable for the following:    Glucose-Capillary 166 (*)    All other components within normal limits  CBG MONITORING, ED    Imaging Review Dg Foot Complete Right  12/31/2013   CLINICAL DATA:  Right lateral foot pain for 1 week.  EXAM: RIGHT FOOT COMPLETE - 3+ VIEW  COMPARISON:  None  FINDINGS: No fracture or dislocation of mid foot or forefoot. The phalanges are normal. The calcaneus is normal. No soft tissue abnormality. Ossific density adjacent to the tarsal rows felt to represent os perineum accessory ossicle  IMPRESSION: No fracture or dislocation.   Electronically Signed   By: Genevive BiStewart  Edmunds M.D.   On: 12/31/2013 19:40     EKG Interpretation None      MDM   Final diagnoses:  Foot pain  Plantar wart of right foot   Patient X-Ray negative for obvious fracture or dislocation. Pt incidental xray findings discussed. Pain managed in ED. Pt diabetic without ulcers. Pt without swelling, joint pain, numbness tingling or weakness. CBG 166 today. Foot neurovascularly intact. Pt with plantar wart. tx with soaks and salicylic acid for 4-6 weeks. Ibuprofen for pain control. Follow up with PCP for persistent or worsening symptoms.  Discussed return precautions with patient. Discussed all results and patient verbalizes understanding and agrees with  plan.   Louann SjogrenVictoria L Avari Gelles, PA-C 12/31/13 2206  Louann SjogrenVictoria L Siddharth Babington, PA-C 12/31/13 2207  Toy BakerAnthony T Allen, MD 01/04/14 [phone]

## 2013-12-31 NOTE — Discharge Instructions (Signed)
Return to the emergency room with worsening of symptoms, new symptoms or with symptoms that are concerning, especially fevers, swelling, numbness tingling or weakness to foot, shooting burning pain.  Ibuprofen 400mg  (2 tablets 200mg ) every 5-6 hours for 3-5 days and then as needed for pain. Treat with OTC salicylic acid applied to affected area 3 times daily.  Liquid, solution: Salicylic acid 27.5% to 28.5% (UltraSal-ER, Virasal): Adults: Soak wart in warm water for 5 minutes. Dry area thoroughly. Apply to entire wart surface, allow to dry, and then apply a second time. Avoid contact with surrounding skin. Continue therapy once or twice daily. Resolution may be expected after 4 to 6 weeks; some warts may take longer to remove. Follow up with PCP in 3-4 weeks if symptoms worsen or are persistent.

## 2013-12-31 NOTE — ED Notes (Signed)
Pt from home c/o R foot soreness with swelling x1 week. Pt has a small spot to bottom of R foot with reddened area and swelling. Pt sts that he is a diabetic. Pt denies N/V/F. Pt is A&O and in NAD

## 2014-01-05 ENCOUNTER — Other Ambulatory Visit: Payer: Self-pay

## 2014-01-14 ENCOUNTER — Telehealth: Payer: Self-pay | Admitting: Internal Medicine

## 2014-01-14 NOTE — Telephone Encounter (Signed)
That is very good. He is taking glimepiride 1.5 tablets daily, decreased to 1 tablet only. If the blood sugars continued to drop below 90, okay to decrease even further to 0.5 tablets a day

## 2014-01-14 NOTE — Telephone Encounter (Signed)
Please advise 

## 2014-01-14 NOTE — Telephone Encounter (Signed)
LMOM for [name], Pts wife to return call.

## 2014-01-14 NOTE — Telephone Encounter (Signed)
Caller name: [name] Relation to pt: wife Call back number:  [phone] or [phone] Pharmacy:  Reason for call:   Patient wife states that patient has been watching what he is eating and has lost 11 pounds and has noticed that his sugar has been dropping. [name] wants to know if patient should alter glimiperide rx dosage.

## 2014-01-18 NOTE — Telephone Encounter (Signed)
Spoke with [name], Pts wife, informed her of Dr. Leta JunglingPaz's recommendations. Informed her if he decreases to 0.5 tablets daily and his blood sugar continues to run below 90 to call and let us know. Lori verbalized understanding.

## 2014-01-18 NOTE — Telephone Encounter (Signed)
Patient wife Michel BickersLori Sermons returning your call please call wife phone [phone] regarding her husband.

## 2014-01-20 ENCOUNTER — Ambulatory Visit: Payer: Self-pay | Admitting: Pulmonary Disease

## 2014-01-21 ENCOUNTER — Other Ambulatory Visit: Payer: Self-pay

## 2014-02-08 ENCOUNTER — Other Ambulatory Visit: Payer: Self-pay

## 2014-02-08 MED ORDER — GLIMEPIRIDE 4 MG PO TABS: ORAL_TABLET | ORAL | Status: DC

## 2014-02-19 ENCOUNTER — Encounter: Payer: Self-pay | Admitting: Internal Medicine

## 2014-02-19 ENCOUNTER — Ambulatory Visit (INDEPENDENT_AMBULATORY_CARE_PROVIDER_SITE_OTHER): Payer: 59 | Admitting: Internal Medicine

## 2014-02-19 VITALS — BP 128/64 | HR 103 | Temp 98.0°F | Ht 69.0 in | Wt 276.5 lb

## 2014-02-19 DIAGNOSIS — F419 Anxiety disorder, unspecified: Secondary | ICD-10-CM

## 2014-02-19 DIAGNOSIS — F418 Other specified anxiety disorders: Secondary | ICD-10-CM

## 2014-02-19 DIAGNOSIS — F1010 Alcohol abuse, uncomplicated: Secondary | ICD-10-CM

## 2014-02-19 DIAGNOSIS — F329 Major depressive disorder, single episode, unspecified: Secondary | ICD-10-CM

## 2014-02-19 DIAGNOSIS — K7010 Alcoholic hepatitis without ascites: Secondary | ICD-10-CM

## 2014-02-19 DIAGNOSIS — E119 Type 2 diabetes mellitus without complications: Secondary | ICD-10-CM

## 2014-02-19 LAB — HEPATIC FUNCTION PANEL
ALK PHOS: 127 U/L — AB (ref 39–117)
ALT: 71 U/L — AB (ref 0–53)
AST: 73 U/L — AB (ref 0–37)
Albumin: 4.1 g/dL (ref 3.5–5.2)
BILIRUBIN DIRECT: 0.4 mg/dL — AB (ref 0.0–0.3)
BILIRUBIN TOTAL: 1.3 mg/dL — AB (ref 0.2–1.2)
Indirect Bilirubin: 0.9 mg/dL (ref 0.2–1.2)
TOTAL PROTEIN: 7.5 g/dL (ref 6.0–8.3)

## 2014-02-19 LAB — HEMOGLOBIN A1C
Hgb A1c MFr Bld: 5.3 % (ref <5.7)
Mean Plasma Glucose: 105 mg/dL (ref <117)

## 2014-02-19 MED ORDER — BUPROPION HCL 100 MG PO TABS: 100.0000 mg | ORAL_TABLET | Freq: Two times a day (BID) | ORAL | Status: DC

## 2014-02-19 NOTE — Assessment & Plan Note (Signed)
Currently on Lexapro 20, continue with anxiety and depression.  Has a new job, lots of stress. Wife is disabled, in addition to his job he spends at least 3 hours daily taking care of her. PHQ-9 today is 20, worse than before despite taking Lexapro 20 mg Denies suicidal ideas Plan: Add Wellbutrin, see instructions, suicidality discuss, return to the office 6 weeks.

## 2014-02-19 NOTE — Assessment & Plan Note (Signed)
Based on last A1c, glimepiride was increased to 6 mg. Seems to be tolerating well, very rarely has symptomatic hypoglycemia, he does carry glucose with him. Plan: aic

## 2014-02-19 NOTE — Progress Notes (Signed)
Subjective:    Patient ID: Ricky Molina, male    DOB: [birth year], 49 y.o.   MRN: [id number]  DOS:  02/19/2014 Type of visit - description : Follow-up Interval history: Diabetes, glimepiride dose was increased, good compliance. Anxiety depression, Lexapro dose was increased to 20 mg, not feeling any different. Alcohol abuse, pt discontinued Antabuse,  prescribed by another provider. Still goes to the [employer], admits to drinking sometimes, denies drinking excessively. Today BP was elevated   ROS Admits to stress at work, things at home are okay although wife is disabled and he is very busy helping her in addition to doing his regular work. Denies any suicidal ideas Ambulatory blood sugars between 100-110. On a rare occasion he feels absent/sweaty --->  has check his blood sugar and is in the 60s  Past Medical History  Diagnosis Date  . Hyperlipidemia   . Allergic rhinitis   . OSA on CPAP   . Diverticulitis 03/23/2011    possible  . Alcoholism 03/24/2011  . Hepatic steatosis 03/25/2011  . Ascites     mild on CT 03-23-11  . Anxiety and depression 12/19/2012  . Elevated BP   . Diabetes     Past Surgical History  Procedure Laterality Date  . Appendectomy  1997    History   Social History  . Marital Status: Married    Spouse Name: Lawson Fiscal    Number of Children: 2  . Years of Education: N/A   Occupational History  . Surveyor, minerals--   working     Social History Main Topics  . Smoking status: Former Smoker -- 1.00 packs/day for 30 years    Types: Cigarettes    Quit date: 01/20/2011  . Smokeless tobacco: Never Used  . Alcohol Use: 25.2 oz/week    42 Cans of beer per week     Comment: h/o abuse,rare ETOH recently  . Drug Use: No  . Sexual Activity: No   Other Topics Concern  . Not on file   Social History Narrative   Married with 2 sons one daughter.   HVAC Curator   Wife disabled, blind                 Medication List       This list is accurate as  of: 02/19/14 11:59 PM.  Always use your most recent med list.               ANTABUSE 500 MG Tabs  Generic drug:  Disulfiram  Take 1 tablet by mouth daily.     buPROPion 100 MG tablet  Commonly known as:  WELLBUTRIN  Take 1 tablet (100 mg total) by mouth 2 (two) times daily.     calcium carbonate 500 MG chewable tablet  Commonly known as:  TUMS - dosed in mg elemental calcium  Chew 1 tablet by mouth daily as needed for indigestion (indigestion). For antacid.     escitalopram 20 MG tablet  Commonly known as:  LEXAPRO  Take 1 tablet (20 mg total) by mouth daily.     glimepiride 4 MG tablet  Commonly known as:  AMARYL  Take 1 and 1/2 tablets daily.     glucose blood test strip  Commonly known as:  ONE TOUCH ULTRA TEST  Use as instructed     ibuprofen 200 MG tablet  Commonly known as:  ADVIL,MOTRIN  Take 400 mg by mouth every 8 (eight) hours as needed for moderate pain (pain). For pain.  onetouch ultrasoft lancets  Use as instructed           Objective:   Physical Exam BP 128/64 mmHg  Pulse 103  Temp(Src) 98 F (36.7 C) (Oral)  Ht 5\' 9"  (1.753 m)  Wt 276 lb 8 oz (125.42 kg)  BMI 40.81 kg/m2  SpO2 94% General -- alert, well-developed, NAD.   Lungs -- normal respiratory effort, no intercostal retractions, no accessory muscle use, and normal breath sounds.  Heart-- normal rate, regular rhythm, no murmur.  Extremities-- trace  pretibial edema bilaterally  Neurologic--  alert & oriented X3. Speech normal, gait appropriate for age, strength symmetric and appropriate for age.  Psych-- Cognition and judgment appear intact. Cooperative with normal attention span and concentration. slt  Anxious, no  depressed appearing.        Assessment & Plan:   Today , I spent more than 25   min with the patient: >50% of the time counseling regards etoh, depression treatment, suicidality

## 2014-02-19 NOTE — Patient Instructions (Signed)
Get your blood work before you leave   Start taking Wellbutrin 100 mg: The first 10 days take one tablet  at night, then take 1 tablet twice a day    Please come back to the office -5-6 weeks for a routine check up

## 2014-02-19 NOTE — Progress Notes (Signed)
Pre visit review using our clinic review tool, if applicable. No additional management support is needed unless otherwise documented below in the visit note. 

## 2014-02-19 NOTE — Assessment & Plan Note (Signed)
Drinking rarely, recheck LFTs today

## 2014-02-21 NOTE — Assessment & Plan Note (Signed)
Off antabuse, now drinks sometimes, still goes to [employer] meetings. Reminded pt that w/ h/o alcoholism, etoh intake needs to be zero, risk of relapse discussed

## 2014-02-23 ENCOUNTER — Other Ambulatory Visit: Payer: Self-pay | Admitting: Internal Medicine

## 2014-02-24 ENCOUNTER — Ambulatory Visit (INDEPENDENT_AMBULATORY_CARE_PROVIDER_SITE_OTHER): Payer: 59 | Admitting: Pulmonary Disease

## 2014-02-24 ENCOUNTER — Encounter: Payer: Self-pay | Admitting: Pulmonary Disease

## 2014-02-24 DIAGNOSIS — G4733 Obstructive sleep apnea (adult) (pediatric): Secondary | ICD-10-CM

## 2014-02-24 NOTE — Assessment & Plan Note (Signed)
The patient is doing well with C Pap overall, but is having some breakthrough snoring despite compliance with his device. His download shows occasional breakthrough, and some mask leak at times. I will go ahead and change his pressure over to the automatic setting, and I've encouraged him to keep up with his mask cushion changes to help with seal. I've also encouraged him to work aggressively on weight loss. He is to let us know if he does not do well on the automatic setting.

## 2014-02-24 NOTE — Progress Notes (Signed)
   Subjective:    Patient ID: Ricky Molina, male    DOB: 1965-05-30, 49 y.o.   MRN: [id number]  HPI Patient comes in today for follow-up of his obstructive sleep apnea. He is wearing C Pap compliantly by his download, but is having some breakthrough apnea at times. His bed partner has noticed breakthrough snoring on occasion. The patient has been trying to keep up with his mask cushions more consistently, and is satisfied with his mask fit. He feels that he sleeps well with his device, and does not have issues with daytime sleepiness.   Review of Systems  Constitutional: Negative for fever and unexpected weight change.  HENT: Negative for congestion, dental problem, ear pain, nosebleeds, postnasal drip, rhinorrhea, sinus pressure, sneezing, sore throat and trouble swallowing.   Eyes: Negative for redness and itching.  Respiratory: Negative for cough, chest tightness, shortness of breath and wheezing.   Cardiovascular: Negative for palpitations and leg swelling.  Gastrointestinal: Negative for nausea and vomiting.  Genitourinary: Negative for dysuria.  Musculoskeletal: Negative for joint swelling.  Skin: Negative for rash.  Neurological: Negative for headaches.  Hematological: Does not bruise/bleed easily.  Psychiatric/Behavioral: Negative for dysphoric mood. The patient is not nervous/anxious.        Objective:   Physical Exam Overweight male in no acute distress Nose without purulence or discharge noted No skin breakdown or pressure necrosis from the C Pap mask Neck without lymphadenopathy or thyromegaly Lower extremities without edema, no cyanosis Alert and oriented, does not appear to be sleepy, moves all 4 extremities.       Assessment & Plan:

## 2014-02-24 NOTE — Patient Instructions (Signed)
Will have your machine set on auto, to better cover your breakthru apneas Keep up with mask changes and supplies. Work on weight loss followup with me again in one year, but call if having issues.

## 2014-03-03 ENCOUNTER — Other Ambulatory Visit: Payer: Self-pay | Admitting: Internal Medicine

## 2014-03-03 MED ORDER — ESCITALOPRAM OXALATE 20 MG PO TABS: 20.0000 mg | ORAL_TABLET | Freq: Every day | ORAL | Status: DC

## 2014-03-03 NOTE — Telephone Encounter (Signed)
NEEDS A NEW RX FOR HIS LEXAPRO 20 MG TAKES 1 PER DAY.  [facility] [address].  HE WOULD LIKE THE RESULTS OF HIS BLOOD WORK.

## 2014-03-03 NOTE — Telephone Encounter (Signed)
Rx sent to [facility].  Spoke with patient and informed him of lab results.  Recommended patient stop drinking alcohol completely due to elevated liver enzymes.  Patient stated understanding.  Notified patient to take decrease his Glimepiride to 4mg  one tablet a day (per lab result note), which he stated he was already doing.  Patient has no further questions at this time.  eal

## 2014-04-12 ENCOUNTER — Other Ambulatory Visit: Payer: Self-pay

## 2014-04-12 MED ORDER — BUPROPION HCL 100 MG PO TABS: 100.0000 mg | ORAL_TABLET | Freq: Two times a day (BID) | ORAL | Status: DC

## 2014-04-14 ENCOUNTER — Telehealth: Payer: Self-pay | Admitting: Internal Medicine

## 2014-04-14 MED ORDER — CHLORDIAZEPOXIDE HCL 25 MG PO CAPS: 25.0000 mg | ORAL_CAPSULE | Freq: Four times a day (QID) | ORAL | Status: DC | PRN

## 2014-04-14 NOTE — Telephone Encounter (Signed)
That is very good, I encouraged him to continue his efforts. Strongly consider AA. Will try chlordiazepoxide for up to 4 times a day as needed. Will cause drowsiness. This is a short-term medication.

## 2014-04-14 NOTE — Telephone Encounter (Signed)
Please advise 

## 2014-04-14 NOTE — Telephone Encounter (Signed)
Caller name: [name] Relation to pt: wife Call back number: [phone] or [phone] [facility]acy[facility]  [facility] [address]  Reason for call:   Patient wife states that patient is trying to stop drinking alcohol. He has been weaning himself off a little bit at a time but is having the shakes. [name] wants to know if there is something that Dr. Drue NovelPaz could prescribe patient to help him with the shakes. Only for a short term

## 2014-04-14 NOTE — Telephone Encounter (Signed)
Spoke with [name], Pt's wife, informed her Dr. Drue NovelPaz has prescribed Librium and I have faxed to [facility]. Informed her to let Pt know to take 1 tablet four times daily PRN for anxiety. Watch for drowsiness. Informed her Dr. Drue NovelPaz also recommends AA meetings. [name] informed me that Pt has already "enrolled" and will be going. Informed her to let us know if medication is not helping. Lori verbalized understanding.

## 2014-04-30 ENCOUNTER — Encounter (HOSPITAL_COMMUNITY): Payer: Self-pay | Admitting: Emergency Medicine

## 2014-04-30 ENCOUNTER — Emergency Department (HOSPITAL_COMMUNITY)
Admission: EM | Admit: 2014-04-30 | Discharge: 2014-05-02 | Disposition: A | Discharge status: Home / Self Care | Payer: 59 | Attending: Emergency Medicine | Admitting: Emergency Medicine

## 2014-04-30 DIAGNOSIS — F1994 Other psychoactive substance use, unspecified with psychoactive substance-induced mood disorder: Secondary | ICD-10-CM | POA: Diagnosis not present

## 2014-04-30 DIAGNOSIS — F1010 Alcohol abuse, uncomplicated: Secondary | ICD-10-CM

## 2014-04-30 DIAGNOSIS — R45851 Suicidal ideations: Secondary | ICD-10-CM | POA: Diagnosis not present

## 2014-04-30 DIAGNOSIS — F1024 Alcohol dependence with alcohol-induced mood disorder: Secondary | ICD-10-CM | POA: Diagnosis not present

## 2014-04-30 DIAGNOSIS — Z87891 Personal history of nicotine dependence: Secondary | ICD-10-CM | POA: Insufficient documentation

## 2014-04-30 DIAGNOSIS — F418 Other specified anxiety disorders: Secondary | ICD-10-CM | POA: Diagnosis not present

## 2014-04-30 DIAGNOSIS — Z9981 Dependence on supplemental oxygen: Secondary | ICD-10-CM | POA: Insufficient documentation

## 2014-04-30 DIAGNOSIS — R000 Tachycardia, unspecified: Secondary | ICD-10-CM | POA: Diagnosis not present

## 2014-04-30 DIAGNOSIS — Z8719 Personal history of other diseases of the digestive system: Secondary | ICD-10-CM | POA: Diagnosis not present

## 2014-04-30 DIAGNOSIS — F10230 Alcohol dependence with withdrawal, uncomplicated: Secondary | ICD-10-CM | POA: Diagnosis not present

## 2014-04-30 DIAGNOSIS — Z8709 Personal history of other diseases of the respiratory system: Secondary | ICD-10-CM | POA: Insufficient documentation

## 2014-04-30 DIAGNOSIS — Y906 Blood alcohol level of 120-199 mg/100 ml: Secondary | ICD-10-CM | POA: Insufficient documentation

## 2014-04-30 DIAGNOSIS — F329 Major depressive disorder, single episode, unspecified: Secondary | ICD-10-CM

## 2014-04-30 DIAGNOSIS — F1324 Sedative, hypnotic or anxiolytic dependence with sedative, hypnotic or anxiolytic-induced mood disorder: Secondary | ICD-10-CM | POA: Diagnosis not present

## 2014-04-30 DIAGNOSIS — E119 Type 2 diabetes mellitus without complications: Secondary | ICD-10-CM | POA: Diagnosis not present

## 2014-04-30 DIAGNOSIS — G4733 Obstructive sleep apnea (adult) (pediatric): Secondary | ICD-10-CM | POA: Insufficient documentation

## 2014-04-30 DIAGNOSIS — Z79899 Other long term (current) drug therapy: Secondary | ICD-10-CM | POA: Insufficient documentation

## 2014-04-30 DIAGNOSIS — F419 Anxiety disorder, unspecified: Secondary | ICD-10-CM

## 2014-04-30 LAB — COMPREHENSIVE METABOLIC PANEL
ALBUMIN: 3.8 g/dL (ref 3.5–5.2)
ALT: 98 U/L — ABNORMAL HIGH (ref 0–53)
AST: 112 U/L — ABNORMAL HIGH (ref 0–37)
Alkaline Phosphatase: 133 U/L — ABNORMAL HIGH (ref 39–117)
Anion gap: 11 (ref 5–15)
BUN: 12 mg/dL (ref 6–23)
CALCIUM: 9.2 mg/dL (ref 8.4–10.5)
CO2: 25 mmol/L (ref 19–32)
CREATININE: 0.85 mg/dL (ref 0.50–1.35)
Chloride: 106 mmol/L (ref 96–112)
GFR calc Af Amer: >90 mL/min (ref >90)
GFR calc non Af Amer: >90 mL/min (ref >90)
GLUCOSE: 130 mg/dL — AB (ref 70–99)
POTASSIUM: 3.7 mmol/L (ref 3.5–5.1)
Sodium: 142 mmol/L (ref 135–145)
TOTAL PROTEIN: 7.7 g/dL (ref 6.0–8.3)
Total Bilirubin: 1.4 mg/dL — ABNORMAL HIGH (ref 0.3–1.2)

## 2014-04-30 LAB — CBC
HEMATOCRIT: 49.6 % (ref 39.0–52.0)
HEMOGLOBIN: 16.8 g/dL (ref 13.0–17.0)
MCH: 34.8 pg — ABNORMAL HIGH (ref 26.0–34.0)
MCHC: 33.9 g/dL (ref 30.0–36.0)
MCV: 102.7 fL — AB (ref 78.0–100.0)
Platelets: 122 10*3/uL — ABNORMAL LOW (ref 150–400)
RBC: 4.83 MIL/uL (ref 4.22–5.81)
RDW: 12.8 % (ref 11.5–15.5)
WBC: 7.5 10*3/uL (ref 4.0–10.5)

## 2014-04-30 LAB — SALICYLATE LEVEL: Salicylate Lvl: <4 mg/dL (ref 2.8–20.0)

## 2014-04-30 LAB — RAPID URINE DRUG SCREEN, HOSP PERFORMED
Amphetamines: NOT DETECTED
Barbiturates: NOT DETECTED
Benzodiazepines: POSITIVE — AB
Cocaine: NOT DETECTED
Opiates: NOT DETECTED
Tetrahydrocannabinol: NOT DETECTED

## 2014-04-30 LAB — ACETAMINOPHEN LEVEL

## 2014-04-30 LAB — CBG MONITORING, ED: Glucose-Capillary: 138 mg/dL — ABNORMAL HIGH (ref 70–99)

## 2014-04-30 LAB — ETHANOL: ALCOHOL ETHYL (B): 360 mg/dL — AB (ref 0–9)

## 2014-04-30 MED ORDER — SODIUM CHLORIDE 0.9 % IV BOLUS (SEPSIS)
1000.0000 mL | Freq: Once | INTRAVENOUS | Status: AC
  Administered 2014-04-30: 1000 mL via INTRAVENOUS

## 2014-04-30 NOTE — ED Notes (Signed)
Patient states his plan for killing himself is a gun. States he has access to multiple guns at home. Patient then recants his statement and states no, he doesn't have guns, his son does but wont let him have them.

## 2014-04-30 NOTE — ED Notes (Signed)
Per [employer], patient from home. Endorses SI, states to [employer] "it would be better off if I was dead". Patient has been to the [facility] in the past for detox. Patient is intoxicated on arrival. Patient wife called [phone] due to patient making states of being "better off dead". 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 Her name is Michel BickersLori Reicks, her number is [phone]. Recent stressors include losing a job 3 weeks ago, and family history of alcoholism. Patient takes Glimepiride 4mg , Lexapro 20 or 40mg  daily, Wellbutrin 100mg  twice daily, and Librium 25mg  4x daily as needed.

## 2014-04-30 NOTE — ED Provider Notes (Signed)
CSN: [id number]     Arrival date & time 04/30/14  2120 History   First MD Initiated Contact with Patient 04/30/14 2259     Chief Complaint  Patient presents with  . Suicidal     (Consider location/radiation/quality/duration/timing/severity/associated sxs/prior Treatment) HPI  Ricky PigeonDaniel W Molina is a 49 y.o. male with past medical history of alcoholism, depression visiting today with suicidal ideation. Patient states he drank a lot of alcohol tonight. He has had suicidal ideation and wants to himself in the head with a gun. He does own a gun. Patient has tried to hang himself in the past. He states that he hates himself and that is why he wishes to kill himself. He denies any HI or hallucinations. Patient has no medical complaints.  10 Systems reviewed and are negative for acute change except as noted in the HPI.     Past Medical History  Diagnosis Date  . Hyperlipidemia   . Allergic rhinitis   . OSA on CPAP   . Diverticulitis 03/23/2011    possible  . Alcoholism 03/24/2011  . Hepatic steatosis 03/25/2011  . Ascites     mild on CT 03-23-11  . Anxiety and depression 12/19/2012  . Elevated BP   . Diabetes    Past Surgical History  Procedure Laterality Date  . Appendectomy  1997   Family History  Problem Relation Age of Onset  . Emphysema Mother   . Hypertension Mother   . Skin cancer Mother     nose  . Breast cancer Mother   . Alcohol abuse Mother   . Breast cancer Mother   . Allergies Sister   . Heart failure Father   . Stroke Father   . Rheum arthritis Father   . Diabetes Father   . Alcohol abuse Father   . Arthritis Father   . Hyperlipidemia Father   . Heart disease Father   . Hypertension Father   . Hypothyroidism Sister    History  Substance Use Topics  . Smoking status: Former Smoker -- 1.00 packs/day for 30 years    Types: Cigarettes    Quit date: 01/20/2011  . Smokeless tobacco: Never Used  . Alcohol Use: 25.2 oz/week    42 Cans of beer per week      Comment: h/o abuse,rare ETOH recently    Review of Systems    Allergies  Morphine and related  Home Medications   Prior to Admission medications   Medication Sig Start Date End Date Taking? Authorizing Provider  buPROPion (WELLBUTRIN) 100 MG tablet Take 1 tablet (100 mg total) by mouth 2 (two) times daily. DUE FOR APPT WITH DR PAZ. [id number]. 04/12/14  Yes Wanda PlumpJose E Paz, MD  calcium carbonate (TUMS - DOSED IN MG ELEMENTAL CALCIUM) 500 MG chewable tablet Chew 1 tablet by mouth daily as needed for indigestion (indigestion). For antacid.   Yes Historical Provider, MD  chlordiazePOXIDE (LIBRIUM) 25 MG capsule Take 1 capsule (25 mg total) by mouth 4 (four) times daily as needed for anxiety. 04/14/14  Yes Wanda PlumpJose E Paz, MD  escitalopram (LEXAPRO) 20 MG tablet Take 1 tablet (20 mg total) by mouth daily. 03/03/14  Yes Wanda PlumpJose E Paz, MD  glimepiride (AMARYL) 4 MG tablet Take 1 and 1/2 tablets daily. 02/08/14  Yes Wanda PlumpJose E Paz, MD  glucose blood (ONE TOUCH ULTRA TEST) test strip Use as instructed 08/12/13  Yes Wanda PlumpJose E Paz, MD  ibuprofen (ADVIL,MOTRIN) 200 MG tablet Take 400 mg by mouth every 8 (eight) hours  as needed for moderate pain (pain). For pain.    Historical Provider, MD  Lancets [facility] ULTRASOFT) lancets Use as instructed Patient not taking: Reported on 04/30/2014 08/12/13   Wanda Plump, MD   BP 128/80 mmHg  Pulse 113  Temp(Src) 97.6 F (36.4 C) (Oral)  Resp 20  Ht  (1.702 m)  Wt 280 lb (127.007 kg)  BMI 43.84 kg/m2  SpO2 92% Physical Exam  Constitutional: He is oriented to person, place, and time. Vital signs are normal. He appears well-developed and well-nourished.  Non-toxic appearance. He does not appear ill. No distress.  HENT:  Head: Normocephalic and atraumatic.  Nose: Nose normal.  Mouth/Throat: Oropharynx is clear and moist. No oropharyngeal exudate.  Eyes: Conjunctivae and EOM are normal. Pupils are equal, round, and reactive to light. No scleral icterus.  Neck: Normal range of motion.  Neck supple. No tracheal deviation, no edema, no erythema and normal range of motion present. No thyroid mass and no thyromegaly present.  Cardiovascular: Regular rhythm, S1 normal, S2 normal, normal heart sounds, intact distal pulses and normal pulses.  Exam reveals no gallop and no friction rub.   No murmur heard. Pulses:      Radial pulses are 2+ on the right side, and 2+ on the left side.       Dorsalis pedis pulses are 2+ on the right side, and 2+ on the left side.  tachycardic  Pulmonary/Chest: Effort normal and breath sounds normal. No respiratory distress. He has no wheezes. He has no rhonchi. He has no rales.  Abdominal: Soft. Normal appearance and bowel sounds are normal. He exhibits no distension, no ascites and no mass. There is no hepatosplenomegaly. There is no tenderness. There is no rebound, no guarding and no CVA tenderness.  Musculoskeletal: Normal range of motion. He exhibits no edema or tenderness.  Lymphadenopathy:    He has no cervical adenopathy.  Neurological: He is alert and oriented to person, place, and time. He has normal strength. No cranial nerve deficit or sensory deficit.  Skin: Skin is warm, dry and intact. No petechiae and no rash noted. He is not diaphoretic. No erythema. No pallor.  Psychiatric:  Suicidal ideation  Nursing note and vitals reviewed.   ED Course  Procedures (including critical care time) Labs Review Labs Reviewed  ACETAMINOPHEN LEVEL - Abnormal; Notable for the following:    Acetaminophen (Tylenol), Serum <10.0 (*)    All other components within normal limits  CBC - Abnormal; Notable for the following:    MCV 102.7 (*)    MCH 34.8 (*)    Platelets 122 (*)    All other components within normal limits  COMPREHENSIVE METABOLIC PANEL - Abnormal; Notable for the following:    Glucose, Bld 130 (*)    AST 112 (*)    ALT 98 (*)    Alkaline Phosphatase 133 (*)    Total Bilirubin 1.4 (*)    All other components within normal limits   ETHANOL - Abnormal; Notable for the following:    Alcohol, Ethyl (B) 360 (*)    All other components within normal limits  URINE RAPID DRUG SCREEN (HOSP PERFORMED) - Abnormal; Notable for the following:    Benzodiazepines POSITIVE (*)    All other components within normal limits  CBG MONITORING, ED - Abnormal; Notable for the following:    Glucose-Capillary 138 (*)    All other components within normal limits  SALICYLATE LEVEL    Imaging Review No results found.  EKG Interpretation None      MDM   Final diagnoses:  None    Patient presents emergency department for suicidal ideation and alcohol abuse. Patient is pretty clear that he wishes to kill himself. He is tachycardic on exam, likely due to alcohol use. We'll give 1 L bolus IV fluids, TTS consult will be obtained for disposition.  Once tachycardia has resolved, patient will be medically cleared for psychiatric evaluation.    Tomasita Crumble, MD 05/01/14 [phone]

## 2014-04-30 NOTE — ED Notes (Signed)
Patient is unclear why he is in the hospital. Patient states he has been drinking a lot today. Patient states "I just wanna die". Patient states his wife said he was being belligerent. [employer] states they can IVC patient if need be, currently patient is voluntary.

## 2014-04-30 NOTE — BH Assessment (Signed)
Received TTS consult request. Spoke to Dr. Rubin PayorPickering who said Dr. Mora Bellmanni isn't available at the moment. TTS will proceed with assessment.  Harlin RainFord Ellis Ria CommentWarrick Jr, LPC, [facility] Triage Specialist [phone]

## 2014-04-30 NOTE — ED Notes (Signed)
Patient was advised plan of care. Patient refuses to allow lab draw. [employer] notified, spoke to patient. Patient is now stating he will allow collection of blood work. Lab tech at bedside.

## 2014-05-01 DIAGNOSIS — F1994 Other psychoactive substance use, unspecified with psychoactive substance-induced mood disorder: Secondary | ICD-10-CM | POA: Diagnosis not present

## 2014-05-01 DIAGNOSIS — F10230 Alcohol dependence with withdrawal, uncomplicated: Secondary | ICD-10-CM | POA: Diagnosis not present

## 2014-05-01 DIAGNOSIS — R45851 Suicidal ideations: Secondary | ICD-10-CM | POA: Diagnosis not present

## 2014-05-01 LAB — CBG MONITORING, ED: Glucose-Capillary: 123 mg/dL — ABNORMAL HIGH (ref 70–99)

## 2014-05-01 LAB — ETHANOL: Alcohol, Ethyl (B): 180 mg/dL — ABNORMAL HIGH (ref 0–9)

## 2014-05-01 MED ORDER — TRAZODONE HCL 100 MG PO TABS
100.0000 mg | ORAL_TABLET | Freq: Every day | ORAL | Status: DC
  Administered 2014-05-01: 100 mg via ORAL
  Filled 2014-05-01: qty 1

## 2014-05-01 MED ORDER — LORAZEPAM 1 MG PO TABS
1.0000 mg | ORAL_TABLET | Freq: Four times a day (QID) | ORAL | Status: DC | PRN
  Administered 2014-05-02: 1 mg via ORAL

## 2014-05-01 MED ORDER — THIAMINE HCL 100 MG/ML IJ SOLN
100.0000 mg | Freq: Once | INTRAMUSCULAR | Status: AC
  Administered 2014-05-01: 100 mg via INTRAMUSCULAR
  Filled 2014-05-01: qty 2

## 2014-05-01 MED ORDER — BUPROPION HCL ER (XL) 150 MG PO TB24
150.0000 mg | ORAL_TABLET | Freq: Every day | ORAL | Status: DC
  Administered 2014-05-01 – 2014-05-02 (×2): 150 mg via ORAL
  Filled 2014-05-01 (×3): qty 1

## 2014-05-01 MED ORDER — LORAZEPAM 1 MG PO TABS
1.0000 mg | ORAL_TABLET | Freq: Four times a day (QID) | ORAL | Status: DC
  Administered 2014-05-01 (×2): 1 mg via ORAL
  Filled 2014-05-01 (×4): qty 1

## 2014-05-01 MED ORDER — FLUOXETINE HCL 20 MG PO CAPS
20.0000 mg | ORAL_CAPSULE | Freq: Every day | ORAL | Status: DC
  Administered 2014-05-01 – 2014-05-02 (×2): 20 mg via ORAL
  Filled 2014-05-01 (×2): qty 1

## 2014-05-01 MED ORDER — ONDANSETRON 4 MG PO TBDP: 4.0000 mg | ORAL_TABLET | Freq: Four times a day (QID) | ORAL | Status: DC | PRN

## 2014-05-01 MED ORDER — BUPROPION HCL 100 MG PO TABS: 100.0000 mg | ORAL_TABLET | Freq: Two times a day (BID) | ORAL | Status: DC

## 2014-05-01 MED ORDER — HYDROXYZINE HCL 25 MG PO TABS: 25.0000 mg | ORAL_TABLET | Freq: Four times a day (QID) | ORAL | Status: DC | PRN

## 2014-05-01 MED ORDER — ADULT MULTIVITAMIN W/MINERALS CH
1.0000 | ORAL_TABLET | Freq: Every day | ORAL | Status: DC
  Administered 2014-05-01 – 2014-05-02 (×2): 1 via ORAL
  Filled 2014-05-01 (×2): qty 1

## 2014-05-01 MED ORDER — LORAZEPAM 1 MG PO TABS: 1.0000 mg | ORAL_TABLET | Freq: Every day | ORAL | Status: DC

## 2014-05-01 MED ORDER — LOPERAMIDE HCL 2 MG PO CAPS: 2.0000 mg | ORAL_CAPSULE | ORAL | Status: DC | PRN

## 2014-05-01 MED ORDER — LORAZEPAM 1 MG PO TABS: 1.0000 mg | ORAL_TABLET | Freq: Two times a day (BID) | ORAL | Status: DC

## 2014-05-01 MED ORDER — ESCITALOPRAM OXALATE 10 MG PO TABS: 20.0000 mg | ORAL_TABLET | Freq: Every day | ORAL | Status: DC

## 2014-05-01 MED ORDER — VITAMIN B-1 100 MG PO TABS
100.0000 mg | ORAL_TABLET | Freq: Every day | ORAL | Status: DC
  Administered 2014-05-02: 100 mg via ORAL
  Filled 2014-05-01: qty 1

## 2014-05-01 MED ORDER — GLIMEPIRIDE 4 MG PO TABS
4.0000 mg | ORAL_TABLET | Freq: Every day | ORAL | Status: DC
  Administered 2014-05-02: 4 mg via ORAL
  Filled 2014-05-01 (×2): qty 1

## 2014-05-01 MED ORDER — LORAZEPAM 1 MG PO TABS: 1.0000 mg | ORAL_TABLET | Freq: Three times a day (TID) | ORAL | Status: DC

## 2014-05-01 MED ORDER — CALCIUM CARBONATE ANTACID 500 MG PO CHEW
1.0000 | CHEWABLE_TABLET | Freq: Every day | ORAL | Status: DC | PRN
  Filled 2014-05-01: qty 1

## 2014-05-01 MED ORDER — LORAZEPAM 1 MG PO TABS: 2.0000 mg | ORAL_TABLET | ORAL | Status: DC | PRN
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
## 2014-05-01 NOTE — BH Assessment (Signed)
Pt referred to [facility].   Floyed Masoud, MA, LPCA, LCASA Therapeutic Triage Specialist [facility]    

## 2014-05-01 NOTE — Consult Note (Signed)
[facility] Consult   Reason for Consult:  Alcohol detox, suicidal ideations Referring Physician:  EDP Patient Identification: Ricky Molina MRN:  [id number] Principal Diagnosis: Substance induced mood disorder Diagnosis:   Patient Active Problem List   Diagnosis Date Noted  . Alcohol dependence with uncomplicated withdrawal [D32.202] 05/01/2014    Priority: High  . Substance induced mood disorder [F19.94] 05/01/2014    Priority: High  . Suicidal ideations [R45.851] 05/01/2014    Priority: High  . Diabetes [E11.9] 01/16/2013  . Anxiety and depression [F41.8] 12/19/2012  . Annual physical exam [R42.70] 04/12/2011  . Hemorrhoids [455] 03/25/2011  . Hepatic steatosis [K76.0] 03/25/2011  . Alcoholic hepatitis [W23.76] 03/24/2011  . ? Diverticulitis [K57.92] 03/23/2011    Class: Question of  . ETOH abuse [F10.10] 03/23/2011  . OSA (obstructive sleep apnea) [G47.33] 09/29/2010    Total Time spent with patient: 45 minutes  Subjective:   Ricky Molina is a 49 y.o. male patient to re-evaluated in am.  HPI:  The patient has been drinking heavily lately, 10-12 shots of liquor per night.  Last night, he was drinking and told his wife he wanted to kill himself but today does not remember saying this.  Denies suicidal/homicidal ideations, hallucinations, and drug issues.  He has been drinking since the age of 49.  His only detox or treatment was last June at the [facility] and he was in the program until September, then relapsed.  He is starting a new job on Monday and wanted to be off the alcohol and his primary care MD gave him Librium to help but he did not stop drinking while taking it so his wife took it from him.  He would like to get treatment again for his depression and alcohol issues but wants to be ready for work on Monday. HPI Elements:   Location:  generalized. Quality:  acute. Severity:  moderate. Timing:  intermittent. Duration:  brief. Context:   intoxicated.  Past Medical History:  Past Medical History  Diagnosis Date  . Hyperlipidemia   . Allergic rhinitis   . OSA on CPAP   . Diverticulitis 03/23/2011    possible  . Alcoholism 03/24/2011  . Hepatic steatosis 03/25/2011  . Ascites     mild on CT 03-23-11  . Anxiety and depression 12/19/2012  . Elevated BP   . Diabetes     Past Surgical History  Procedure Laterality Date  . Appendectomy  1997   Family History:  Family History  Problem Relation Age of Onset  . Emphysema Mother   . Hypertension Mother   . Skin cancer Mother     nose  . Breast cancer Mother   . Alcohol abuse Mother   . Breast cancer Mother   . Allergies Sister   . Heart failure Father   . Stroke Father   . Rheum arthritis Father   . Diabetes Father   . Alcohol abuse Father   . Arthritis Father   . Hyperlipidemia Father   . Heart disease Father   . Hypertension Father   . Hypothyroidism Sister    Social History:  History  Alcohol Use  . 25.2 oz/week  . 40 Cans of beer per week    Comment: h/o abuse,rare ETOH recently     History  Drug Use No    History   Social History  . Marital Status: Married    Spouse Name: Cecille Rubin  . Number of Children: 2  . Years of Education:  N/A   Occupational History  . Insurance account manager--   working     Social History Main Topics  . Smoking status: Former Smoker -- 1.00 packs/day for 30 years    Types: Cigarettes    Quit date: 01/20/2011  . Smokeless tobacco: Never Used  . Alcohol Use: 25.2 oz/week    42 Cans of beer per week     Comment: h/o abuse,rare ETOH recently  . Drug Use: No  . Sexual Activity: No   Other Topics Concern  . None   Social History Narrative   Married with 2 sons one daughter.   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, blind            Additional Social History:    Pain Medications: Pt denies Prescriptions: See MAR Over the Counter: Pt denies History of alcohol / drug use?: Yes Longest period of sobriety (when/how long): Nine  years Negative Consequences of Use: Financial, Personal relationships, Work / Youth worker Withdrawal Symptoms: Blackouts, Irritability, Tremors, Sweats Name of Substance 1: Alcohol 1 - Age of First Use: Adolescent 1 - Amount (size/oz): 11-2 shots of liquor and 2-3 beers 1 - Frequency: daily 1 - Duration: ongoing for over one year 1 - Last Use / Amount: 04/30/14, unknown                   Allergies:   Allergies  Allergen Reactions  . Morphine And Related     Labs:  Results for orders placed or performed during the hospital encounter of 04/30/14 (from the past 48 hour(s))  CBG monitoring, ED     Status: Abnormal   Collection Time: 04/30/14  9:29 PM  Result Value Ref Range   Glucose-Capillary 138 (H) 70 - 99 mg/dL  Acetaminophen level     Status: Abnormal   Collection Time: 04/30/14  9:41 PM  Result Value Ref Range   Acetaminophen (Tylenol), Serum <10.0 (L) 10 - 30 ug/mL    Comment:        THERAPEUTIC CONCENTRATIONS VARY SIGNIFICANTLY. A RANGE OF 10-30 ug/mL MAY BE AN EFFECTIVE CONCENTRATION FOR MANY PATIENTS. HOWEVER, SOME ARE BEST TREATED AT CONCENTRATIONS OUTSIDE THIS RANGE. ACETAMINOPHEN CONCENTRATIONS >150 ug/mL AT 4 HOURS AFTER INGESTION AND >50 ug/mL AT 12 HOURS AFTER INGESTION ARE OFTEN ASSOCIATED WITH TOXIC REACTIONS.   CBC     Status: Abnormal   Collection Time: 04/30/14  9:41 PM  Result Value Ref Range   WBC 7.5 4.0 - 10.5 K/uL   RBC 4.83 4.22 - 5.81 MIL/uL   Hemoglobin 16.8 13.0 - 17.0 g/dL   HCT 49.6 39.0 - 52.0 %   MCV 102.7 (H) 78.0 - 100.0 fL   MCH 34.8 (H) 26.0 - 34.0 pg   MCHC 33.9 30.0 - 36.0 g/dL   RDW 12.8 11.5 - 15.5 %   Platelets 122 (L) 150 - 400 K/uL  Comprehensive metabolic panel     Status: Abnormal   Collection Time: 04/30/14  9:41 PM  Result Value Ref Range   Sodium 142 135 - 145 mmol/L   Potassium 3.7 3.5 - 5.1 mmol/L   Chloride 106 96 - 112 mmol/L   CO2 25 19 - 32 mmol/L   Glucose, Bld 130 (H) 70 - 99 mg/dL   BUN 12 6 - 23  mg/dL   Creatinine, Ser 0.85 0.50 - 1.35 mg/dL   Calcium 9.2 8.4 - 10.5 mg/dL   Total Protein 7.7 6.0 - 8.3 g/dL   Albumin 3.8 3.5 - 5.2  g/dL   AST 112 (H) 0 - 37 U/L   ALT 98 (H) 0 - 53 U/L   Alkaline Phosphatase 133 (H) 39 - 117 U/L   Total Bilirubin 1.4 (H) 0.3 - 1.2 mg/dL   GFR calc non Af Amer >90 >90 mL/min   GFR calc Af Amer >90 >90 mL/min    Comment: (NOTE) The eGFR has been calculated using the CKD EPI equation. This calculation has not been validated in all clinical situations. eGFR's persistently <90 mL/min signify possible Chronic Kidney Disease.    Anion gap 11 5 - 15  Ethanol (ETOH)     Status: Abnormal   Collection Time: 04/30/14  9:41 PM  Result Value Ref Range   Alcohol, Ethyl (B) 360 (H) 0 - 9 mg/dL    Comment:        LOWEST DETECTABLE LIMIT FOR SERUM ALCOHOL IS 11 mg/dL FOR MEDICAL PURPOSES ONLY   Salicylate level     Status: None   Collection Time: 04/30/14  9:41 PM  Result Value Ref Range   Salicylate Lvl <7.4 2.8 - 20.0 mg/dL  Urine Drug Screen     Status: Abnormal   Collection Time: 04/30/14  9:55 PM  Result Value Ref Range   Opiates NONE DETECTED NONE DETECTED   Cocaine NONE DETECTED NONE DETECTED   Benzodiazepines POSITIVE (A) NONE DETECTED   Amphetamines NONE DETECTED NONE DETECTED   Tetrahydrocannabinol NONE DETECTED NONE DETECTED   Barbiturates NONE DETECTED NONE DETECTED    Comment:        DRUG SCREEN FOR MEDICAL PURPOSES ONLY.  IF CONFIRMATION IS NEEDED FOR ANY PURPOSE, NOTIFY LAB WITHIN 5 DAYS.        LOWEST DETECTABLE LIMITS FOR URINE DRUG SCREEN Drug Class       Cutoff (ng/mL) Amphetamine      1000 Barbiturate      200 Benzodiazepine   128 Tricyclics       786 Opiates          300 Cocaine          300 THC              50   Ethanol     Status: Abnormal   Collection Time: 05/01/14  9:30 AM  Result Value Ref Range   Alcohol, Ethyl (B) 180 (H) 0 - 9 mg/dL    Comment:        LOWEST DETECTABLE LIMIT FOR SERUM ALCOHOL IS 11  mg/dL FOR MEDICAL PURPOSES ONLY     Vitals: Blood pressure 120/69, pulse 101, temperature 97.7 F (36.5 C), temperature source Oral, resp. rate 18, height $RemoveBe'5\' 7"'izehfiKmK$  (1.702 m), weight 280 lb (127.007 kg), SpO2 92 %.  Risk to Self: Suicidal Ideation: Yes-Currently Present Suicidal Intent: Yes-Currently Present Is patient at risk for suicide?: Yes Suicidal Plan?: Yes-Currently Present Specify Current Suicidal Plan: Reports thoughts of shooting himself Access to Means: No What has been your use of drugs/alcohol within the last 12 months?: Pt drinking alcohol daily How many times?: 1 (Age 36 attempted to hang himself) Other Self Harm Risks: None identified Triggers for Past Attempts: Other (Comment) (Broke up with girlfriend) Intentional Self Injurious Behavior: None Risk to Others: Homicidal Ideation: No Thoughts of Harm to Others: No Current Homicidal Intent: No Current Homicidal Plan: No Access to Homicidal Means: No Identified Victim: None History of harm to others?: No Assessment of Violence: None Noted Violent Behavior Description: Pt denies history of violence Does patient have access to weapons?:  No Criminal Charges Pending?: No Does patient have a court date: No Prior Inpatient Therapy: Prior Inpatient Therapy: No Prior Therapy Dates: NA Prior Therapy Facilty/Provider(s): NA Reason for Treatment: NA Prior Outpatient Therapy: Prior Outpatient Therapy: Yes Prior Therapy Dates: 2015 Prior Therapy Facilty/Provider(s): [facility] Reason for Treatment: Alcohol dependence  Current Facility-Administered Medications  Medication Dose Route Frequency Provider Last Rate Last Dose  . buPROPion (WELLBUTRIN XL) 24 hr tablet 150 mg  150 mg Oral Daily Dontez Hauss      . calcium carbonate (TUMS - dosed in mg elemental calcium) chewable tablet 200 mg of elemental calcium  1 tablet Oral Daily PRN Patrecia Pour, NP      . FLUoxetine (PROZAC) capsule 20 mg  20 mg Oral Daily Axel Frisk   20 mg at 05/01/14 1244  . [START ON 05/02/2014] glimepiride (AMARYL) tablet 4 mg  4 mg Oral Q breakfast Patrecia Pour, NP      . LORazepam (ATIVAN) tablet 2 mg  2 mg Oral Q4H PRN Jahon Bart      . traZODone (DESYREL) tablet 100 mg  100 mg Oral QHS Catlyn Shipton       Current Outpatient Prescriptions  Medication Sig Dispense Refill  . buPROPion (WELLBUTRIN) 100 MG tablet Take 1 tablet (100 mg total) by mouth 2 (two) times daily. DUE FOR APPT WITH DR PAZ. [phone]. 60 tablet 1  . calcium carbonate (TUMS - DOSED IN MG ELEMENTAL CALCIUM) 500 MG chewable tablet Chew 1 tablet by mouth daily as needed for indigestion (indigestion). For antacid.    Marland Kitchen escitalopram (LEXAPRO) 20 MG tablet Take 1 tablet (20 mg total) by mouth daily. 90 tablet 0  . glimepiride (AMARYL) 4 MG tablet Take 1 and 1/2 tablets daily. 45 tablet 2  . glucose blood (ONE TOUCH ULTRA TEST) test strip Use as instructed 100 each 12  . ibuprofen (ADVIL,MOTRIN) 200 MG tablet Take 400 mg by mouth every 8 (eight) hours as needed for moderate pain (pain). For pain.    . Lancets (ONETOUCH ULTRASOFT) lancets Use as instructed (Patient not taking: Reported on 04/30/2014) 100 each 12    Musculoskeletal: Strength & Muscle Tone: within normal limits Gait & Station: normal Patient leans: N/A  Psychiatric Specialty Exam:     Blood pressure 120/69, pulse 101, temperature 97.7 F (36.5 C), temperature source Oral, resp. rate 18, height $RemoveBe'5\' 7"'kCUUjiiHb$  (1.702 m), weight 280 lb (127.007 kg), SpO2 92 %.Body mass index is 43.84 kg/(m^2).  General Appearance: Casual  Eye Contact::  Good  Speech:  Normal Rate  Volume:  Normal  Mood:  Anxious, depressed  Affect:  Congruent  Thought Process:  Coherent  Orientation:  Full (Time, Place, and Person)  Thought Content:  WDL  Suicidal Thoughts:  No  Homicidal Thoughts:  No  Memory:  Immediate;   Good Recent;   Fair Remote;   Good  Judgement:  Fair  Insight:  Fair  Psychomotor Activity:   Decreased  Concentration:  Fair  Recall:  [place] of Knowledge:Good  Language: Good  Akathisia:  No  Handed:  Right  AIMS (if indicated):     Assets:  Housing Leisure Time Physical Health Resilience Social Support Vocational/Educational  ADL's:  Intact  Cognition: WNL  Sleep:      Medical Decision Making: Review of Psycho-Social Stressors (1), Review or order clinical lab tests (1) and Review of Medication Regimen & Side Effects (2)  Treatment Plan Summary: Daily contact with patient to assess  and evaluate symptoms and progress in treatment, Medication management and Plan re-evaluate in the am  Plan:  Supportive therapy provided about ongoing stressors. Disposition: Re-evaluate in the am  Waylan Boga, PMH-NP 05/01/2014 3:20 PM Patient seen face-to-face for psychiatric evaluation, chart reviewed and case discussed with the physician extender and developed treatment plan. Reviewed the information documented and agree with the treatment plan. Corena Pilgrim, MD

## 2014-05-01 NOTE — ED Notes (Signed)
Bed: ZO10WA26 Expected date:  Expected time:  Means of arrival:  Comments: HALL A

## 2014-05-01 NOTE — ED Notes (Addendum)
RT Ricky BumpsJessica was notified re pt's need of cpap machine, she states that it will be a while before she can get to it and see if she can find one.  Terri CN made aware.  Pt is left in the hallway for supervision at this time until sitter is available.

## 2014-05-01 NOTE — Progress Notes (Signed)
RN called RT for CPAP.  Pt in hallway bed at this time, RN stated that they are moving Pt into a room.  RT will administer once Pt is in a room.  RT to monitor and assess as needed.

## 2014-05-01 NOTE — ED Notes (Signed)
Telepsych in progress. 

## 2014-05-01 NOTE — ED Notes (Signed)
Pt requests discharge home, he reports that his wife is blind and he needs to take care of her.  Trinna PostAlex, Child psychotherapistsocial worker and psych NP made aware and are are at bedside talking to pt.

## 2014-05-01 NOTE — ED Notes (Signed)
Belongings in Locker 26 

## 2014-05-02 LAB — CBG MONITORING, ED
GLUCOSE-CAPILLARY: 115 mg/dL — AB (ref 70–99)
Glucose-Capillary: 170 mg/dL — ABNORMAL HIGH (ref 70–99)

## 2014-05-02 MED ORDER — HYDROXYZINE HCL 25 MG PO TABS: 25.0000 mg | ORAL_TABLET | Freq: Four times a day (QID) | ORAL | Status: DC | PRN

## 2014-05-02 MED ORDER — TRAZODONE HCL 100 MG PO TABS: 100.0000 mg | ORAL_TABLET | Freq: Every day | ORAL | Status: DC

## 2014-05-02 MED ORDER — LORAZEPAM 1 MG PO TABS
1.0000 mg | ORAL_TABLET | Freq: Four times a day (QID) | ORAL | Status: DC | PRN
Start: 2014-05-02 — End: 2014-07-02

## 2014-05-02 MED ORDER — BUPROPION HCL ER (XL) 150 MG PO TB24: 150.0000 mg | ORAL_TABLET | Freq: Every day | ORAL | Status: DC

## 2014-05-02 NOTE — ED Notes (Signed)
Patient reports he is feeling better but still a bit shaky.  Both hands visibly shaking.  Patient requested prn Ativan.

## 2014-05-02 NOTE — [facility] Suicide Risk Assessment (Signed)
Suicide Risk Assessment  Discharge Assessment   [facility]   Demographic Factors:  Male and Caucasian  Total Time spent with patient: 30 minutes  Musculoskeletal: Strength & Muscle Tone: within normal limits Gait & Station: normal Patient leans: N/A  Psychiatric Specialty Exam:     Blood pressure 146/73, pulse 102, temperature 98.2 F (36.8 C), temperature source Oral, resp. rate 18, height 5\' 7"  (1.702 m), weight 280 lb (127.007 kg), SpO2 94 %.Body mass index is 43.84 kg/(m^2).  General Appearance: Casual  Eye Contact::  Good  Speech:  Normal Rate  Volume:  Normal  Mood:  Mild depressed  Affect:  Congruent  Thought Process:  Coherent  Orientation:  Full (Time, Place, and Person)  Thought Content:  WDL  Suicidal Thoughts:  No  Homicidal Thoughts:  No  Memory:  Immediate;   Good Recent;   Fair Remote;   Good  Judgement:  Fair  Insight:  Fair  Psychomotor Activity:  Normal  Concentration:  Good  Recall:  Good  Fund of Knowledge:Good  Language: Good  Akathisia:  No  Handed:  Right  AIMS (if indicated):     Assets:  Housing Leisure Time Physical Health Resilience Social Support Vocational/Educational  ADL's:  Intact  Cognition: WNL  Sleep:         Has this patient used any form of tobacco in the last 30 days? (Cigarettes, Smokeless Tobacco, Cigars, and/or Pipes) Yes, A prescription for an FDA-approved tobacco cessation medication was offered at discharge and the patient refused  Mental Status Per Nursing Assessment::   On Admission:   Anxiety, depression  Current Mental Status by Physician: NA  Loss Factors: NA  Historical Factors: NA  Risk Reduction Factors:   Responsible for children under 218 years of age, Sense of responsibility to family, Employed, Living with another person, especially a relative, Positive social support and Positive coping skills or problem solving skills  Continued Clinical Symptoms:  Mild  depression  Cognitive Features That Contribute To Risk:  None    Suicide Risk:  Minimal: No identifiable suicidal ideation.  Patients presenting with no risk factors but with morbid ruminations; may be classified as minimal risk based on the severity of the depressive symptoms  Principal Problem: Substance induced mood disorder Discharge Diagnoses:  Patient Active Problem List   Diagnosis Date Noted  . Alcohol dependence with uncomplicated withdrawal [F10.230] 05/01/2014    Priority: High  . Substance induced mood disorder [F19.94] 05/01/2014    Priority: High  . Suicidal ideations [R45.851] 05/01/2014    Priority: High  . Diabetes [E11.9] 01/16/2013  . Anxiety and depression [F41.8] 12/19/2012  . Annual physical exam [Z00.00] 04/12/2011  . Hemorrhoids [455] 03/25/2011  . Hepatic steatosis [K76.0] 03/25/2011  . Alcoholic hepatitis [K70.10] 03/24/2011  . ? Diverticulitis [K57.92] 03/23/2011    Class: Question of  . ETOH abuse [F10.10] 03/23/2011  . OSA (obstructive sleep apnea) [G47.33] 09/29/2010      Plan Of Care/Follow-up recommendations:  Activity:  as tolerated Diet:  heart healthy diet  Is patient on multiple antipsychotic therapies at discharge:  No   Has Patient had three or more failed trials of antipsychotic monotherapy by history:  No  Recommended Plan for Multiple Antipsychotic Therapies: NA    Itai Barbian, PMH-NP 05/02/2014, 2:50 PM

## 2014-05-02 NOTE — Consult Note (Signed)
[facility] Consult   Reason for Consult:  Alcohol detox, suicidal ideations Referring Physician:  EDP Patient Identification: Ricky Molina MRN:  [id number] Principal Diagnosis: Substance induced mood disorder Diagnosis:   Patient Active Problem List   Diagnosis Date Noted  . Alcohol dependence with uncomplicated withdrawal [D40.814] 05/01/2014    Priority: High  . Substance induced mood disorder [F19.94] 05/01/2014    Priority: High  . Suicidal ideations [R45.851] 05/01/2014    Priority: High  . Diabetes [E11.9] 01/16/2013  . Anxiety and depression [F41.8] 12/19/2012  . Annual physical exam [G81.85] 04/12/2011  . Hemorrhoids [455] 03/25/2011  . Hepatic steatosis [K76.0] 03/25/2011  . Alcoholic hepatitis [U31.49] 03/24/2011  . ? Diverticulitis [K57.92] 03/23/2011    Class: Question of  . ETOH abuse [F10.10] 03/23/2011  . OSA (obstructive sleep apnea) [G47.33] 09/29/2010    Total Time spent with patient: 30 minutes  Subjective:   Ricky Molina is a 49 y.o. male patient to re-evaluated in am.  HPI:  The patient has stabilized, continues to deny suicidal/homicidal ideations, hallucinations, and drug issues.  He wants to start his new job tomorrow and follow-up with the [facility] program (where he has friends and support) and out-patient therapy for his depression.  Della also stated he could contact his counselor from the [facility] since she helped him the last time.  HPI Elements:   Location:  generalized. Quality:  acute. Severity:  moderate. Timing:  intermittent. Duration:  brief. Context:  intoxicated.  Past Medical History:  Past Medical History  Diagnosis Date  . Hyperlipidemia   . Allergic rhinitis   . OSA on CPAP   . Diverticulitis 03/23/2011    possible  . Alcoholism 03/24/2011  . Hepatic steatosis 03/25/2011  . Ascites     mild on CT 03-23-11  . Anxiety and depression 12/19/2012  . Elevated BP   . Diabetes     Past Surgical History   Procedure Laterality Date  . Appendectomy  1997   Family History:  Family History  Problem Relation Age of Onset  . Emphysema Mother   . Hypertension Mother   . Skin cancer Mother     nose  . Breast cancer Mother   . Alcohol abuse Mother   . Breast cancer Mother   . Allergies Sister   . Heart failure Father   . Stroke Father   . Rheum arthritis Father   . Diabetes Father   . Alcohol abuse Father   . Arthritis Father   . Hyperlipidemia Father   . Heart disease Father   . Hypertension Father   . Hypothyroidism Sister    Social History:  History  Alcohol Use  . 25.2 oz/week  . 32 Cans of beer per week    Comment: h/o abuse,rare ETOH recently     History  Drug Use No    History   Social History  . Marital Status: Married    Spouse Name: Cecille Rubin  . Number of Children: 2  . Years of Education: N/A   Occupational History  . Insurance account manager--   working     Social History Main Topics  . Smoking status: Former Smoker -- 1.00 packs/day for 30 years    Types: Cigarettes    Quit date: 01/20/2011  . Smokeless tobacco: Never Used  . Alcohol Use: 25.2 oz/week    42 Cans of beer per week     Comment: h/o abuse,rare ETOH recently  . Drug Use: No  .  Sexual Activity: No   Other Topics Concern  . None   Social History Narrative   Married with 2 sons one daughter.   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, blind            Additional Social History:    Pain Medications: Pt denies Prescriptions: See MAR Over the Counter: Pt denies History of alcohol / drug use?: Yes Longest period of sobriety (when/how long): Nine years Negative Consequences of Use: Financial, Personal relationships, Work / Youth worker Withdrawal Symptoms: Blackouts, Irritability, Tremors, Sweats Name of Substance 1: Alcohol 1 - Age of First Use: Adolescent 1 - Amount (size/oz): 11-2 shots of liquor and 2-3 beers 1 - Frequency: daily 1 - Duration: ongoing for over one year 1 - Last Use / Amount: 04/30/14,  unknown                   Allergies:   Allergies  Allergen Reactions  . Morphine And Related     Labs:  Results for orders placed or performed during the hospital encounter of 04/30/14 (from the past 48 hour(s))  CBG monitoring, ED     Status: Abnormal   Collection Time: 04/30/14  9:29 PM  Result Value Ref Range   Glucose-Capillary 138 (H) 70 - 99 mg/dL  Acetaminophen level     Status: Abnormal   Collection Time: 04/30/14  9:41 PM  Result Value Ref Range   Acetaminophen (Tylenol), Serum <10.0 (L) 10 - 30 ug/mL    Comment:        THERAPEUTIC CONCENTRATIONS VARY SIGNIFICANTLY. A RANGE OF 10-30 ug/mL MAY BE AN EFFECTIVE CONCENTRATION FOR MANY PATIENTS. HOWEVER, SOME ARE BEST TREATED AT CONCENTRATIONS OUTSIDE THIS RANGE. ACETAMINOPHEN CONCENTRATIONS >150 ug/mL AT 4 HOURS AFTER INGESTION AND >50 ug/mL AT 12 HOURS AFTER INGESTION ARE OFTEN ASSOCIATED WITH TOXIC REACTIONS.   CBC     Status: Abnormal   Collection Time: 04/30/14  9:41 PM  Result Value Ref Range   WBC 7.5 4.0 - 10.5 K/uL   RBC 4.83 4.22 - 5.81 MIL/uL   Hemoglobin 16.8 13.0 - 17.0 g/dL   HCT 49.6 39.0 - 52.0 %   MCV 102.7 (H) 78.0 - 100.0 fL   MCH 34.8 (H) 26.0 - 34.0 pg   MCHC 33.9 30.0 - 36.0 g/dL   RDW 12.8 11.5 - 15.5 %   Platelets 122 (L) 150 - 400 K/uL  Comprehensive metabolic panel     Status: Abnormal   Collection Time: 04/30/14  9:41 PM  Result Value Ref Range   Sodium 142 135 - 145 mmol/L   Potassium 3.7 3.5 - 5.1 mmol/L   Chloride 106 96 - 112 mmol/L   CO2 25 19 - 32 mmol/L   Glucose, Bld 130 (H) 70 - 99 mg/dL   BUN 12 6 - 23 mg/dL   Creatinine, Ser 0.85 0.50 - 1.35 mg/dL   Calcium 9.2 8.4 - 10.5 mg/dL   Total Protein 7.7 6.0 - 8.3 g/dL   Albumin 3.8 3.5 - 5.2 g/dL   AST 112 (H) 0 - 37 U/L   ALT 98 (H) 0 - 53 U/L   Alkaline Phosphatase 133 (H) 39 - 117 U/L   Total Bilirubin 1.4 (H) 0.3 - 1.2 mg/dL   GFR calc non Af Amer >90 >90 mL/min   GFR calc Af Amer >90 >90 mL/min     Comment: (NOTE) The eGFR has been calculated using the CKD EPI equation. This calculation has not been validated in  all clinical situations. eGFR's persistently <90 mL/min signify possible Chronic Kidney Disease.    Anion gap 11 5 - 15  Ethanol (ETOH)     Status: Abnormal   Collection Time: 04/30/14  9:41 PM  Result Value Ref Range   Alcohol, Ethyl (B) 360 (H) 0 - 9 mg/dL    Comment:        LOWEST DETECTABLE LIMIT FOR SERUM ALCOHOL IS 11 mg/dL FOR MEDICAL PURPOSES ONLY   Salicylate level     Status: None   Collection Time: 04/30/14  9:41 PM  Result Value Ref Range   Salicylate Lvl <6.5 2.8 - 20.0 mg/dL  Urine Drug Screen     Status: Abnormal   Collection Time: 04/30/14  9:55 PM  Result Value Ref Range   Opiates NONE DETECTED NONE DETECTED   Cocaine NONE DETECTED NONE DETECTED   Benzodiazepines POSITIVE (A) NONE DETECTED   Amphetamines NONE DETECTED NONE DETECTED   Tetrahydrocannabinol NONE DETECTED NONE DETECTED   Barbiturates NONE DETECTED NONE DETECTED    Comment:        DRUG SCREEN FOR MEDICAL PURPOSES ONLY.  IF CONFIRMATION IS NEEDED FOR ANY PURPOSE, NOTIFY LAB WITHIN 5 DAYS.        LOWEST DETECTABLE LIMITS FOR URINE DRUG SCREEN Drug Class       Cutoff (ng/mL) Amphetamine      1000 Barbiturate      200 Benzodiazepine   465 Tricyclics       035 Opiates          300 Cocaine          300 THC              50   Ethanol     Status: Abnormal   Collection Time: 05/01/14  9:30 AM  Result Value Ref Range   Alcohol, Ethyl (B) 180 (H) 0 - 9 mg/dL    Comment:        LOWEST DETECTABLE LIMIT FOR SERUM ALCOHOL IS 11 mg/dL FOR MEDICAL PURPOSES ONLY   CBG monitoring, ED     Status: Abnormal   Collection Time: 05/01/14  9:14 PM  Result Value Ref Range   Glucose-Capillary 123 (H) 70 - 99 mg/dL  CBG monitoring, ED     Status: Abnormal   Collection Time: 05/02/14  7:08 AM  Result Value Ref Range   Glucose-Capillary 115 (H) 70 - 99 mg/dL  CBG monitoring, ED     Status:  Abnormal   Collection Time: 05/02/14  1:40 PM  Result Value Ref Range   Glucose-Capillary 170 (H) 70 - 99 mg/dL    Vitals: Blood pressure 146/73, pulse 102, temperature 98.2 F (36.8 C), temperature source Oral, resp. rate 18, height $RemoveBe'5\' 7"'jlFCWklng$  (1.702 m), weight 280 lb (127.007 kg), SpO2 94 %.  Risk to Self: Suicidal Ideation: Yes-Currently Present Suicidal Intent: Yes-Currently Present Is patient at risk for suicide?: Yes Suicidal Plan?: Yes-Currently Present Specify Current Suicidal Plan: Reports thoughts of shooting himself Access to Means: No What has been your use of drugs/alcohol within the last 12 months?: Pt drinking alcohol daily How many times?: 1 (Age 62 attempted to hang himself) Other Self Harm Risks: None identified Triggers for Past Attempts: Other (Comment) (Broke up with girlfriend) Intentional Self Injurious Behavior: None Risk to Others: Homicidal Ideation: No Thoughts of Harm to Others: No Current Homicidal Intent: No Current Homicidal Plan: No Access to Homicidal Means: No Identified Victim: None History of harm to others?: No Assessment of Violence:  None Noted Violent Behavior Description: Pt denies history of violence Does patient have access to weapons?: No Criminal Charges Pending?: No Does patient have a court date: No Prior Inpatient Therapy: Prior Inpatient Therapy: No Prior Therapy Dates: NA Prior Therapy Facilty/Provider(s): NA Reason for Treatment: NA Prior Outpatient Therapy: Prior Outpatient Therapy: Yes Prior Therapy Dates: 2015 Prior Therapy Facilty/Provider(s): [facility] Reason for Treatment: Alcohol dependence  Current Facility-Administered Medications  Medication Dose Route Frequency Provider Last Rate Last Dose  . buPROPion (WELLBUTRIN XL) 24 hr tablet 150 mg  150 mg Oral Daily Mickell Birdwell   150 mg at 05/02/14 1051  . calcium carbonate (TUMS - dosed in mg elemental calcium) chewable tablet 200 mg of elemental calcium  1 tablet Oral  Daily PRN Patrecia Pour, NP      . FLUoxetine (PROZAC) capsule 20 mg  20 mg Oral Daily Johnthan Axtman   20 mg at 05/02/14 1051  . glimepiride (AMARYL) tablet 4 mg  4 mg Oral Q breakfast Patrecia Pour, NP   4 mg at 05/02/14 2694  . hydrOXYzine (ATARAX/VISTARIL) tablet 25 mg  25 mg Oral Q6H PRN Patrecia Pour, NP      . loperamide (IMODIUM) capsule 2-4 mg  2-4 mg Oral PRN Patrecia Pour, NP      . LORazepam (ATIVAN) tablet 1 mg  1 mg Oral Q6H PRN Patrecia Pour, NP   1 mg at 05/02/14 8546  . LORazepam (ATIVAN) tablet 1 mg  1 mg Oral QID Patrecia Pour, NP   1 mg at 05/01/14 2110   Followed by  . LORazepam (ATIVAN) tablet 1 mg  1 mg Oral TID Patrecia Pour, NP       Followed by  . [START ON 05/03/2014] LORazepam (ATIVAN) tablet 1 mg  1 mg Oral BID Patrecia Pour, NP       Followed by  . [START ON 05/05/2014] LORazepam (ATIVAN) tablet 1 mg  1 mg Oral Daily Patrecia Pour, NP      . multivitamin with minerals tablet 1 tablet  1 tablet Oral Daily Patrecia Pour, NP   1 tablet at 05/02/14 1052  . ondansetron (ZOFRAN-ODT) disintegrating tablet 4 mg  4 mg Oral Q6H PRN Patrecia Pour, NP      . thiamine (VITAMIN B-1) tablet 100 mg  100 mg Oral Daily Patrecia Pour, NP   100 mg at 05/02/14 1051  . traZODone (DESYREL) tablet 100 mg  100 mg Oral QHS Daymien Goth   100 mg at 05/01/14 2111   Current Outpatient Prescriptions  Medication Sig Dispense Refill  . buPROPion (WELLBUTRIN) 100 MG tablet Take 1 tablet (100 mg total) by mouth 2 (two) times daily. DUE FOR APPT WITH DR PAZ. [phone]. 60 tablet 1  . calcium carbonate (TUMS - DOSED IN MG ELEMENTAL CALCIUM) 500 MG chewable tablet Chew 1 tablet by mouth daily as needed for indigestion (indigestion). For antacid.    Marland Kitchen escitalopram (LEXAPRO) 20 MG tablet Take 1 tablet (20 mg total) by mouth daily. 90 tablet 0  . glimepiride (AMARYL) 4 MG tablet Take 1 and 1/2 tablets daily. 45 tablet 2  . glucose blood (ONE TOUCH ULTRA TEST) test strip Use as instructed  100 each 12  . ibuprofen (ADVIL,MOTRIN) 200 MG tablet Take 400 mg by mouth every 8 (eight) hours as needed for moderate pain (pain). For pain.    . Lancets (ONETOUCH ULTRASOFT) lancets Use as instructed (Patient not taking:  Reported on 04/30/2014) 100 each 12    Musculoskeletal: Strength & Muscle Tone: within normal limits Gait & Station: normal Patient leans: N/A  Psychiatric Specialty Exam:     Blood pressure 146/73, pulse 102, temperature 98.2 F (36.8 C), temperature source Oral, resp. rate 18, height $RemoveBe'5\' 7"'nAlSVBQMo$  (1.702 m), weight 280 lb (127.007 kg), SpO2 94 %.Body mass index is 43.84 kg/(m^2).  General Appearance: Casual  Eye Contact::  Good  Speech:  Normal Rate  Volume:  Normal  Mood:  Mild depressed  Affect:  Congruent  Thought Process:  Coherent  Orientation:  Full (Time, Place, and Person)  Thought Content:  WDL  Suicidal Thoughts:  No  Homicidal Thoughts:  No  Memory:  Immediate;   Good Recent;   Fair Remote;   Good  Judgement:  Fair  Insight:  Fair  Psychomotor Activity:  Normal  Concentration:  Good  Recall:  Good  Fund of Knowledge:Good  Language: Good  Akathisia:  No  Handed:  Right  AIMS (if indicated):     Assets:  Housing Leisure Time Physical Health Resilience Social Support Vocational/Educational  ADL's:  Intact  Cognition: WNL  Sleep:      Medical Decision Making: Review of Psycho-Social Stressors (1), Review or order clinical lab tests (1) and Review of Medication Regimen & Side Effects (2)  Treatment Plan Summary: Plan:  Discharge home with resources for out-patient therapy, Rx (5 Ativan to assist with any withdrawal symptoms, Vistaril 25 mg every six hours PRN for anxiety, Wellbutrin 150 mg in the am, Trazodone for sleep PRN). Disposition: Discharge   Waylan Boga, [place] 05/02/2014 2:34 PM Patient seen face-to-face for psychiatric evaluation, chart reviewed and case discussed with the physician extender and developed treatment plan. Reviewed  the information documented and agree with the treatment plan. Corena Pilgrim, MD

## 2014-05-07 ENCOUNTER — Other Ambulatory Visit: Payer: Self-pay | Admitting: Internal Medicine

## 2014-05-10 ENCOUNTER — Other Ambulatory Visit: Payer: Self-pay | Admitting: Internal Medicine

## 2014-05-11 ENCOUNTER — Telehealth: Payer: Self-pay | Admitting: Internal Medicine

## 2014-05-11 ENCOUNTER — Other Ambulatory Visit: Payer: Self-pay

## 2014-05-11 MED ORDER — BUPROPION HCL ER (XL) 150 MG PO TB24: 150.0000 mg | ORAL_TABLET | Freq: Two times a day (BID) | ORAL | Status: DC

## 2014-05-11 NOTE — Telephone Encounter (Signed)
LMOM informing Annice PihJackie that Dr. Drue NovelPaz wanted Pt on Welbutrin 100 mg 1 tablet bid, not sure when Wellbutrin was changed to 150 mg, that Pt did have ED visit 04/2014, if ED changed medication, Pt needs ED F/U, [facility] that Pt will need to come in for OV with Dr. Drue NovelPaz or other provider since Dr. Drue NovelPaz will not be back until next Tuesday.

## 2014-05-11 NOTE — Telephone Encounter (Signed)
[facility], informing that Pt was increased to 1 tablet bid by Dr. Drue NovelPaz in 02/2014. Informed them to call if they had any further questions.

## 2014-05-11 NOTE — Telephone Encounter (Signed)
[facility]   Pharm[facility]: [address] [place] [place] [id number] [phone] (Phone) [phone] (Fax)        Reason for call:  Pharmacy requesting a refill buPROPion (WELLBUTRIN XL) 150 MG 24 hr tablet

## 2014-05-11 NOTE — Telephone Encounter (Signed)
Refills sent

## 2014-05-11 NOTE — Telephone Encounter (Signed)
Caller name:jackie Relation to pt: Call back number:[phone] [facility]  Reason for call: needs clarification on pts buPROPion (WELLBUTRIN XL) 150 MG 24 hr tablet 100 mg twice daily, but what was called in 150 mg twice daily but it is a once daily dosage.

## 2014-05-11 NOTE — Telephone Encounter (Signed)
Needs clarification on med that was just sent in.   [facility] [phone]

## 2014-05-19 ENCOUNTER — Other Ambulatory Visit: Payer: Self-pay | Admitting: Internal Medicine

## 2014-05-24 ENCOUNTER — Other Ambulatory Visit: Payer: Self-pay

## 2014-05-24 MED ORDER — ESCITALOPRAM OXALATE 20 MG PO TABS: 20.0000 mg | ORAL_TABLET | Freq: Every day | ORAL | Status: DC

## 2014-05-24 MED ORDER — ONETOUCH DELICA LANCETS FINE MISC: Status: DC

## 2014-05-31 ENCOUNTER — Other Ambulatory Visit: Payer: Self-pay | Admitting: Internal Medicine

## 2014-06-24 ENCOUNTER — Other Ambulatory Visit: Payer: Self-pay

## 2014-06-29 ENCOUNTER — Other Ambulatory Visit: Payer: Self-pay | Admitting: Internal Medicine

## 2014-06-30 ENCOUNTER — Other Ambulatory Visit: Payer: Self-pay

## 2014-07-02 ENCOUNTER — Encounter: Payer: Self-pay | Admitting: Internal Medicine

## 2014-07-02 ENCOUNTER — Ambulatory Visit (INDEPENDENT_AMBULATORY_CARE_PROVIDER_SITE_OTHER): Payer: 59 | Admitting: Internal Medicine

## 2014-07-02 VITALS — BP 128/84 | HR 94 | Temp 98.1°F | Ht 67.0 in | Wt 281.2 lb

## 2014-07-02 DIAGNOSIS — F1010 Alcohol abuse, uncomplicated: Secondary | ICD-10-CM | POA: Diagnosis not present

## 2014-07-02 DIAGNOSIS — F419 Anxiety disorder, unspecified: Secondary | ICD-10-CM

## 2014-07-02 DIAGNOSIS — E119 Type 2 diabetes mellitus without complications: Secondary | ICD-10-CM | POA: Diagnosis not present

## 2014-07-02 DIAGNOSIS — F329 Major depressive disorder, single episode, unspecified: Secondary | ICD-10-CM

## 2014-07-02 DIAGNOSIS — F418 Other specified anxiety disorders: Secondary | ICD-10-CM | POA: Diagnosis not present

## 2014-07-02 LAB — ALT: ALT: 96 U/L — ABNORMAL HIGH (ref 0–53)

## 2014-07-02 LAB — HEMOGLOBIN A1C: Hgb A1c MFr Bld: 5.4 % (ref 4.6–6.5)

## 2014-07-02 LAB — AST: AST: 90 U/L — ABNORMAL HIGH (ref 0–37)

## 2014-07-02 MED ORDER — ESCITALOPRAM OXALATE 20 MG PO TABS: 20.0000 mg | ORAL_TABLET | Freq: Every day | ORAL | Status: AC

## 2014-07-02 NOTE — Patient Instructions (Signed)
Get your blood work before you leave   Please go back to [employer] meetings

## 2014-07-02 NOTE — Assessment & Plan Note (Signed)
Admitted 04-2014 with suicidal ideas in the context of alcohol intake. Currently drinking "very little", extensive discussion about the need to completely avoid alcohol, strongly recommend to go back to AA meetings Recheck LFTs

## 2014-07-02 NOTE — Assessment & Plan Note (Signed)
Status post admission to the psychiatry unit 04-2014 with suicidal ideas. Medications were not changed. He's not needing Ativan or trazodone Emotionally he reports he is doing well. Plan: Refill medications, will keep Atarax a his medication list in case he needs it. See comments under alcohol abuse, recommend AA meetings

## 2014-07-02 NOTE — Assessment & Plan Note (Signed)
On Amaryl, CBGs around 100-130, check A1c, ? decrease doses of Amaryl

## 2014-07-02 NOTE — Progress Notes (Signed)
Pre visit review using our clinic review tool, if applicable. No additional management support is needed unless otherwise documented below in the visit note. 

## 2014-07-02 NOTE — Progress Notes (Signed)
Subjective:    Patient ID: Ricky Molina, male    DOB: 1965/08/15, 49 y.o.   MRN: [id number]  DOS:  07/02/2014 Type of visit - description : f/u Interval history: Since the last time he was here in January he went to the emergency room 04/30/2014 with suicidal ideas. At the time he tested positive for EtOH, he was admitted by psychiatry, discharged 2 days later, same medications. Since then he has not seen anybody, no psychiatry follow-up. In general feels well emotionally, has a new job, very happy with it. Diabetes, good compliance of medication, ambulatory CBGs range from 100-150, very rarely has symptoms consistent with low sugars . EtOH, still drinking, "very little, not every day". The last time he drink was one beer yesterday "I had a hard day"   Review of Systems  Denies chest pain or difficulty breathing No nausea, vomiting, diarrhea. Sleeping well without any medication No suicidal ideas  Past Medical History  Diagnosis Date  . Hyperlipidemia   . Allergic rhinitis   . OSA on CPAP   . Diverticulitis 03/23/2011    possible  . Alcoholism 03/24/2011  . Hepatic steatosis 03/25/2011  . Ascites     mild on CT 03-23-11  . Anxiety and depression 12/19/2012  . Elevated BP   . Diabetes     Past Surgical History  Procedure Laterality Date  . Appendectomy  1997    History   Social History  . Marital Status: Married    Spouse Name: [name]  . Number of Children: 2  . Years of Education: N/A   Occupational History  . Surveyor, mineralsHVAC mechanic--   working     Social History Main Topics  . Smoking status: Former Smoker -- 1.00 packs/day for 30 years    Types: Cigarettes    Quit date: 01/20/2011  . Smokeless tobacco: Never Used  . Alcohol Use: 25.2 oz/week    42 Cans of beer per week     Comment: h/o abuse,rare ETOH recently  . Drug Use: No  . Sexual Activity: No   Other Topics Concern  . Not on file   Social History Narrative   Married with 2 sons one daughter.   HVAC  [employer]at[employer]   Wife disabled, blind                 Medication List       This list is accurate as of: 07/02/14 11:59 PM.  Always use your most recent med list.               buPROPion 100 MG tablet  Commonly known as:  WELLBUTRIN  Take 1 tablet (100 mg total) by mouth 2 (two) times daily.     calcium carbonate 500 MG chewable tablet  Commonly known as:  TUMS - dosed in mg elemental calcium  Chew 1 tablet by mouth daily as needed for indigestion (indigestion). For antacid.     escitalopram 20 MG tablet  Commonly known as:  LEXAPRO  Take 1 tablet (20 mg total) by mouth daily.     glimepiride 4 MG tablet  Commonly known as:  AMARYL  Take 1 and 1/2 tablet (6 mg total) by mouth daily.     glucose blood test strip  Commonly known as:  ONE TOUCH ULTRA TEST  Use as instructed     hydrOXYzine 25 MG tablet  Commonly known as:  ATARAX/VISTARIL  Take 1 tablet (25 mg total) by mouth every 6 (six) hours as  needed (anxiety/agitation or CIWA < or = 10).     ibuprofen 200 MG tablet  Commonly known as:  ADVIL,MOTRIN  Take 400 mg by mouth every 8 (eight) hours as needed for moderate pain (pain). For pain.     ONETOUCH DELICA LANCETS FINE Misc  Check blood sugar no more than twice daily.           Objective:   Physical Exam BP 128/84 mmHg  Pulse 94  Temp(Src) 98.1 F (36.7 C) (Oral)  Ht  (1.702 m)  Wt 281 lb 4 oz (127.574 kg)  BMI 44.04 kg/m2  SpO2 97%  General:   Well developed, well nourished . NAD.  HEENT:  Normocephalic . Face symmetric, atraumatic Lungs:  CTA B Normal respiratory effort, no intercostal retractions, no accessory muscle use. Heart: RRR,  no murmur.  No pretibial edema bilaterally  Skin: Not pale. Not jaundice Neurologic:  alert & oriented X3.  Speech normal, gait appropriate for age and unassisted Psych--  Cognition and judgment appear intact.  Cooperative with normal attention span and concentration.  Behavior appropriate. No anxious  or depressed appearing.       Assessment & Plan:

## 2014-07-06 NOTE — Addendum Note (Signed)
Addended by: Dorette GrateFAULKNER, Cary Lothrop C on: 07/06/2014 01:58 PM   Modules accepted: Medications

## 2014-07-27 ENCOUNTER — Other Ambulatory Visit: Payer: Self-pay | Admitting: Internal Medicine

## 2014-08-24 ENCOUNTER — Encounter (HOSPITAL_COMMUNITY): Payer: Self-pay

## 2014-08-24 ENCOUNTER — Emergency Department (HOSPITAL_COMMUNITY)
Admission: EM | Admit: 2014-08-24 | Discharge: 2014-08-24 | Disposition: A | Discharge status: Home / Self Care | Payer: 59 | Attending: Emergency Medicine | Admitting: Emergency Medicine

## 2014-08-24 DIAGNOSIS — Z87891 Personal history of nicotine dependence: Secondary | ICD-10-CM | POA: Insufficient documentation

## 2014-08-24 DIAGNOSIS — Z8719 Personal history of other diseases of the digestive system: Secondary | ICD-10-CM | POA: Insufficient documentation

## 2014-08-24 DIAGNOSIS — E119 Type 2 diabetes mellitus without complications: Secondary | ICD-10-CM | POA: Insufficient documentation

## 2014-08-24 DIAGNOSIS — Z9981 Dependence on supplemental oxygen: Secondary | ICD-10-CM | POA: Insufficient documentation

## 2014-08-24 DIAGNOSIS — F419 Anxiety disorder, unspecified: Secondary | ICD-10-CM | POA: Insufficient documentation

## 2014-08-24 DIAGNOSIS — R945 Abnormal results of liver function studies: Secondary | ICD-10-CM

## 2014-08-24 DIAGNOSIS — Z79899 Other long term (current) drug therapy: Secondary | ICD-10-CM | POA: Insufficient documentation

## 2014-08-24 DIAGNOSIS — F1010 Alcohol abuse, uncomplicated: Secondary | ICD-10-CM | POA: Insufficient documentation

## 2014-08-24 DIAGNOSIS — F329 Major depressive disorder, single episode, unspecified: Secondary | ICD-10-CM | POA: Insufficient documentation

## 2014-08-24 DIAGNOSIS — Z8709 Personal history of other diseases of the respiratory system: Secondary | ICD-10-CM | POA: Insufficient documentation

## 2014-08-24 DIAGNOSIS — G4733 Obstructive sleep apnea (adult) (pediatric): Secondary | ICD-10-CM | POA: Insufficient documentation

## 2014-08-24 DIAGNOSIS — R7989 Other specified abnormal findings of blood chemistry: Secondary | ICD-10-CM | POA: Insufficient documentation

## 2014-08-24 LAB — COMPREHENSIVE METABOLIC PANEL
ALBUMIN: 3.6 g/dL (ref 3.5–5.0)
ALT: 104 U/L — AB (ref 17–63)
AST: 197 U/L — ABNORMAL HIGH (ref 15–41)
Alkaline Phosphatase: 166 U/L — ABNORMAL HIGH (ref 38–126)
Anion gap: 9 (ref 5–15)
BILIRUBIN TOTAL: 1.6 mg/dL — AB (ref 0.3–1.2)
BUN: 11 mg/dL (ref 6–20)
CO2: 26 mmol/L (ref 22–32)
CREATININE: 0.66 mg/dL (ref 0.61–1.24)
Calcium: 9.2 mg/dL (ref 8.9–10.3)
Chloride: 107 mmol/L (ref 101–111)
GFR calc non Af Amer: >60 mL/min (ref >60)
Glucose, Bld: 118 mg/dL — ABNORMAL HIGH (ref 65–99)
Potassium: 4.1 mmol/L (ref 3.5–5.1)
Sodium: 142 mmol/L (ref 135–145)
TOTAL PROTEIN: 7.3 g/dL (ref 6.5–8.1)

## 2014-08-24 LAB — CBC WITH DIFFERENTIAL/PLATELET
BASOS PCT: 0 % (ref 0–1)
Basophils Absolute: 0 10*3/uL (ref 0.0–0.1)
EOS ABS: 0.1 10*3/uL (ref 0.0–0.7)
Eosinophils Relative: 1 % (ref 0–5)
HCT: 45.6 % (ref 39.0–52.0)
Hemoglobin: 15.2 g/dL (ref 13.0–17.0)
LYMPHS ABS: 2.4 10*3/uL (ref 0.7–4.0)
Lymphocytes Relative: 48 % — ABNORMAL HIGH (ref 12–46)
MCH: 34.5 pg — ABNORMAL HIGH (ref 26.0–34.0)
MCHC: 33.3 g/dL (ref 30.0–36.0)
MCV: 103.6 fL — ABNORMAL HIGH (ref 78.0–100.0)
Monocytes Absolute: 0.4 10*3/uL (ref 0.1–1.0)
Monocytes Relative: 8 % (ref 3–12)
Neutro Abs: 2.2 10*3/uL (ref 1.7–7.7)
Neutrophils Relative %: 43 % (ref 43–77)
PLATELETS: 105 10*3/uL — AB (ref 150–400)
RBC: 4.4 MIL/uL (ref 4.22–5.81)
RDW: 13.3 % (ref 11.5–15.5)
WBC: 5.1 10*3/uL (ref 4.0–10.5)

## 2014-08-24 LAB — RAPID URINE DRUG SCREEN, HOSP PERFORMED
Amphetamines: NOT DETECTED
BARBITURATES: NOT DETECTED
Benzodiazepines: NOT DETECTED
Cocaine: NOT DETECTED
OPIATES: NOT DETECTED
Tetrahydrocannabinol: NOT DETECTED

## 2014-08-24 LAB — ETHANOL: Alcohol, Ethyl (B): 257 mg/dL — ABNORMAL HIGH (ref <5)

## 2014-08-24 MED ORDER — CHLORDIAZEPOXIDE HCL 25 MG PO CAPS: ORAL_CAPSULE | ORAL | Status: DC

## 2014-08-24 NOTE — ED Notes (Signed)
Pt presents with c/o detox from alcohol. Pt denies any SI/HI. Pt reports that he drinks daily, last drink was approx 25 minutes ago.

## 2014-08-24 NOTE — Discharge Instructions (Signed)
Please follow up from the list attached. You were medically cleared in emergency department.    Alcohol Use Disorder Alcohol use disorder is a mental disorder. It is not a one-time incident of heavy drinking. Alcohol use disorder is the excessive and uncontrollable use of alcohol over time that leads to problems with functioning in one or more areas of daily living. People with this disorder risk harming themselves and others when they drink to excess. Alcohol use disorder also can cause other mental disorders, such as mood and anxiety disorders, and serious physical problems. People with alcohol use disorder often misuse other drugs.  Alcohol use disorder is common and widespread. Some people with this disorder drink alcohol to cope with or escape from negative life events. Others drink to relieve chronic pain or symptoms of mental illness. People with a family history of alcohol use disorder are at higher risk of losing control and using alcohol to excess.  SYMPTOMS  Signs and symptoms of alcohol use disorder may include the following:   Consumption ofalcohol inlarger amounts or over a longer period of time than intended.  Multiple unsuccessful attempts to cutdown or control alcohol use.   A great deal of time spent obtaining alcohol, using alcohol, or recovering from the effects of alcohol (hangover).  A strong desire or urge to use alcohol (cravings).   Continued use of alcohol despite problems at work, school, or home because of alcohol use.   Continued use of alcohol despite problems in relationships because of alcohol use.  Continued use of alcohol in situations when it is physically hazardous, such as driving a car.  Continued use of alcohol despite awareness of a physical or psychological problem that is likely related to alcohol use. Physical problems related to alcohol use can involve the brain, heart, liver, stomach, and intestines. Psychological problems related to alcohol  use include intoxication, depression, anxiety, psychosis, delirium, and dementia.   The need for increased amounts of alcohol to achieve the same desired effect, or a decreased effect from the consumption of the same amount of alcohol (tolerance).  Withdrawal symptoms upon reducing or stopping alcohol use, or alcohol use to reduce or avoid withdrawal symptoms. Withdrawal symptoms include:  Racing heart.  Hand tremor.  Difficulty sleeping.  Nausea.  Vomiting.  Hallucinations.  Restlessness.  Seizures. DIAGNOSIS Alcohol use disorder is diagnosed through an assessment by your health care provider. Your health care provider may start by asking three or four questions to screen for excessive or problematic alcohol use. To confirm a diagnosis of alcohol use disorder, at least two symptoms must be present within a 3762-month period. The severity of alcohol use disorder depends on the number of symptoms:  Mild--two or three.  Moderate--four or five.  Severe--six or more. Your health care provider may perform a physical exam or use results from lab tests to see if you have physical problems resulting from alcohol use. Your health care provider may refer you to a mental health professional for evaluation. TREATMENT  Some people with alcohol use disorder are able to reduce their alcohol use to low-risk levels. Some people with alcohol use disorder need to quit drinking alcohol. When necessary, mental health professionals with specialized training in substance use treatment can help. Your health care provider can help you decide how severe your alcohol use disorder is and what type of treatment you need. The following forms of treatment are available:   Detoxification. Detoxification involves the use of prescription medicines to prevent alcohol withdrawal  symptoms in the first week after quitting. This is important for people with a history of symptoms of withdrawal and for heavy drinkers who are  likely to have withdrawal symptoms. Alcohol withdrawal can be dangerous and, in severe cases, cause death. Detoxification is usually provided in a hospital or in-patient substance use treatment facility.  Counseling or talk therapy. Talk therapy is provided by substance use treatment counselors. It addresses the reasons people use alcohol and ways to keep them from drinking again. The goals of talk therapy are to help people with alcohol use disorder find healthy activities and ways to cope with life stress, to identify and avoid triggers for alcohol use, and to handle cravings, which can cause relapse.  Medicines.Different medicines can help treat alcohol use disorder through the following actions:  Decrease alcohol cravings.  Decrease the positive reward response felt from alcohol use.  Produce an uncomfortable physical reaction when alcohol is used (aversion therapy).  Support groups. Support groups are run by people who have quit drinking. They provide emotional support, advice, and guidance. These forms of treatment are often combined. Some people with alcohol use disorder benefit from intensive combination treatment provided by specialized substance use treatment centers. Both inpatient and outpatient treatment programs are available. Document Released: 03/01/2004 Document Revised: 06/08/2013 Document Reviewed: 05/01/2012 [facility] Patient Information 2015 [place], [place]. This information is not intended to replace advice given to you by your health care provider. Make sure you discuss any questions you have with your health care provider.

## 2014-08-24 NOTE — ED Provider Notes (Signed)
CSN: [id number]     Arrival date & time 08/24/14  1614 History   First MD Initiated Contact with Patient 08/24/14 1652     Chief Complaint  Patient presents with  . Detox      (Consider location/radiation/quality/duration/timing/severity/associated sxs/prior Treatment) HPI Ricky Molina is a 49 y.o. male with hx of diverticulitis, hepatic steatosis, htn, diabetes, alcoholism, presents to ED with complaint of wanting alcohol detox. Pt states he has been drinking for 30 years, drinks daily, states he takes on average 6-10 shots and drinks beer daily. He states he has been to rehabilitation once before but it did not help. Patient requesting inpatient detox. He states he tried outpatient medication but that did not help him as well. He denies any other complaints. He denies any pain today. No other medical issues. He denies any suicidal or homicidal ideations.  Past Medical History  Diagnosis Date  . Hyperlipidemia   . Allergic rhinitis   . OSA on CPAP   . Diverticulitis 03/23/2011    possible  . Alcoholism 03/24/2011  . Hepatic steatosis 03/25/2011  . Ascites     mild on CT 03-23-11  . Anxiety and depression 12/19/2012  . Elevated BP   . Diabetes    Past Surgical History  Procedure Laterality Date  . Appendectomy  1997   Family History  Problem Relation Age of Onset  . Emphysema Mother   . Hypertension Mother   . Skin cancer Mother     nose  . Breast cancer Mother   . Alcohol abuse Mother   . Breast cancer Mother   . Allergies Sister   . Heart failure Father   . Stroke Father   . Rheum arthritis Father   . Diabetes Father   . Alcohol abuse Father   . Arthritis Father   . Hyperlipidemia Father   . Heart disease Father   . Hypertension Father   . Hypothyroidism Sister    History  Substance Use Topics  . Smoking status: Former Smoker -- 1.00 packs/day for 30 years    Types: Cigarettes    Quit date: 01/20/2011  . Smokeless tobacco: Never Used  . Alcohol Use: 25.2  oz/week    42 Cans of beer per week     Comment: h/o abuse,rare ETOH recently    Review of Systems  Constitutional: Negative for fever and chills.  Respiratory: Negative for cough, chest tightness and shortness of breath.   Cardiovascular: Negative for chest pain, palpitations and leg swelling.  Gastrointestinal: Negative for nausea, vomiting, abdominal pain, diarrhea and abdominal distention.  Genitourinary: Negative for dysuria, urgency, frequency and hematuria.  Musculoskeletal: Negative for myalgias, arthralgias, neck pain and neck stiffness.  Skin: Negative for rash.  Allergic/Immunologic: Negative for immunocompromised state.  Neurological: Negative for dizziness, weakness, light-headedness, numbness and headaches.  Psychiatric/Behavioral: Negative.       Allergies  Morphine and related  Home Medications   Prior to Admission medications   Medication Sig Start Date End Date Taking? Authorizing Provider  buPROPion (WELLBUTRIN) 100 MG tablet Take 1 tablet (100 mg total) by mouth 2 (two) times daily. 07/27/14  Yes Wanda Plump, MD  escitalopram (LEXAPRO) 20 MG tablet Take 1 tablet (20 mg total) by mouth daily. 07/02/14  Yes Wanda Plump, MD  glimepiride (AMARYL) 4 MG tablet Take 1 and 1/2 tablet (6 mg total) by mouth daily. Patient taking differently: Take 4 mg by mouth daily with breakfast.  05/19/14  Yes Wanda Plump, MD  ibuprofen (ADVIL,MOTRIN) 200 MG tablet Take 400 mg by mouth every 8 (eight) hours as needed for moderate pain (pain). For pain.   Yes Historical Provider, MD  NON FORMULARY at bedtime. cpap   Yes Historical Provider, MD  hydrOXYzine (ATARAX/VISTARIL) 25 MG tablet Take 1 tablet (25 mg total) by mouth every 6 (six) hours as needed (anxiety/agitation or CIWA < or = 10). Patient not taking: Reported on 07/02/2014 05/02/14   Charm RingsJamison Y Lord, NP   BP 117/71 mmHg  Pulse 105  Temp(Src) 98 F (36.7 C) (Oral)  Resp 19  SpO2 94% Physical Exam  Constitutional: He appears  well-developed and well-nourished. No distress.  HENT:  Head: Normocephalic and atraumatic.  Eyes: Conjunctivae are normal.  Neck: Neck supple.  Cardiovascular: Normal rate, regular rhythm and normal heart sounds.   Pulmonary/Chest: Effort normal. No respiratory distress. He has no wheezes. He has no rales.  Abdominal: Soft. Bowel sounds are normal. He exhibits no distension. There is no tenderness. There is no rebound.  Musculoskeletal: He exhibits no edema.  Neurological: He is alert.  Skin: Skin is warm and dry.  Nursing note and vitals reviewed.   ED Course  Procedures (including critical care time) Labs Review Labs Reviewed  CBC WITH DIFFERENTIAL/PLATELET - Abnormal; Notable for the following:    MCV 103.6 (*)    MCH 34.5 (*)    Platelets 105 (*)    Lymphocytes Relative 48 (*)    All other components within normal limits  COMPREHENSIVE METABOLIC PANEL - Abnormal; Notable for the following:    Glucose, Bld 118 (*)    AST 197 (*)    ALT 104 (*)    Alkaline Phosphatase 166 (*)    Total Bilirubin 1.6 (*)    All other components within normal limits  ETHANOL - Abnormal; Notable for the following:    Alcohol, Ethyl (B) 257 (*)    All other components within normal limits  URINE RAPID DRUG SCREEN, HOSP PERFORMED    Imaging Review No results found.   EKG Interpretation None      MDM   Final diagnoses:  Alcohol abuse  Elevated LFTs    Patient in emergency department requesting inpatient detox for alcohol abuse. Patient's last drink was just prior to coming in. He is calm, cooperative. Mildly tachycardic otherwise normal vital signs. We'll get medical screening labs and outpatient resources for detox.  6:58 PM Patient's LFTs are elevated, not too far off of his baseline, history of Hepatic steatosis and alcohol abuse. This was discussed with patient and he is aware. Otherwise he is medically cleared for inpatient detox. Will give him resources and Librium  prescription. Follow-up outpatient.  Filed Vitals:   08/24/14 1620  BP: 117/71  Pulse: 105  Temp: 98 F (36.7 C)  Resp: [id number]       Mariateresa Batra, PA-C 08/24/14 1859  Alvira MondayErin Schlossman, MD 08/25/14 1617

## 2014-09-23 ENCOUNTER — Emergency Department (HOSPITAL_COMMUNITY): Payer: BLUE CROSS/BLUE SHIELD

## 2014-09-23 ENCOUNTER — Emergency Department (HOSPITAL_COMMUNITY)
Admission: EM | Admit: 2014-09-23 | Discharge: 2014-09-24 | Disposition: A | Discharge status: Home / Self Care | Payer: BLUE CROSS/BLUE SHIELD | Attending: Emergency Medicine | Admitting: Emergency Medicine

## 2014-09-23 ENCOUNTER — Encounter (HOSPITAL_COMMUNITY): Payer: Self-pay | Admitting: *Deleted

## 2014-09-23 ENCOUNTER — Telehealth: Payer: Self-pay | Admitting: Internal Medicine

## 2014-09-23 DIAGNOSIS — S0081XA Abrasion of other part of head, initial encounter: Secondary | ICD-10-CM | POA: Insufficient documentation

## 2014-09-23 DIAGNOSIS — S92511A Displaced fracture of proximal phalanx of right lesser toe(s), initial encounter for closed fracture: Secondary | ICD-10-CM | POA: Diagnosis not present

## 2014-09-23 DIAGNOSIS — S0990XA Unspecified injury of head, initial encounter: Secondary | ICD-10-CM | POA: Diagnosis present

## 2014-09-23 DIAGNOSIS — Z79899 Other long term (current) drug therapy: Secondary | ICD-10-CM | POA: Insufficient documentation

## 2014-09-23 DIAGNOSIS — Y998 Other external cause status: Secondary | ICD-10-CM | POA: Insufficient documentation

## 2014-09-23 DIAGNOSIS — F419 Anxiety disorder, unspecified: Secondary | ICD-10-CM | POA: Insufficient documentation

## 2014-09-23 DIAGNOSIS — Z9981 Dependence on supplemental oxygen: Secondary | ICD-10-CM | POA: Insufficient documentation

## 2014-09-23 DIAGNOSIS — F329 Major depressive disorder, single episode, unspecified: Secondary | ICD-10-CM | POA: Diagnosis not present

## 2014-09-23 DIAGNOSIS — Y9289 Other specified places as the place of occurrence of the external cause: Secondary | ICD-10-CM | POA: Insufficient documentation

## 2014-09-23 DIAGNOSIS — Z87891 Personal history of nicotine dependence: Secondary | ICD-10-CM | POA: Diagnosis not present

## 2014-09-23 DIAGNOSIS — E119 Type 2 diabetes mellitus without complications: Secondary | ICD-10-CM | POA: Diagnosis not present

## 2014-09-23 DIAGNOSIS — W01198A Fall on same level from slipping, tripping and stumbling with subsequent striking against other object, initial encounter: Secondary | ICD-10-CM | POA: Diagnosis not present

## 2014-09-23 DIAGNOSIS — Y9389 Activity, other specified: Secondary | ICD-10-CM | POA: Insufficient documentation

## 2014-09-23 DIAGNOSIS — G4733 Obstructive sleep apnea (adult) (pediatric): Secondary | ICD-10-CM | POA: Diagnosis not present

## 2014-09-23 DIAGNOSIS — E785 Hyperlipidemia, unspecified: Secondary | ICD-10-CM | POA: Insufficient documentation

## 2014-09-23 DIAGNOSIS — S62619A Displaced fracture of proximal phalanx of unspecified finger, initial encounter for closed fracture: Secondary | ICD-10-CM

## 2014-09-23 DIAGNOSIS — W19XXXA Unspecified fall, initial encounter: Secondary | ICD-10-CM

## 2014-09-23 NOTE — Telephone Encounter (Signed)
pre visit letter mailed 09/14/14  °

## 2014-09-23 NOTE — ED Notes (Signed)
Patient stated he got out of his truck went to step up on the curb and fell hitting his right forehead and right hand.  C/o pain to the pinky finger and hand  Bleeding controlled

## 2014-09-23 NOTE — ED Provider Notes (Signed)
MSE was initiated and I personally evaluated the patient and placed orders (if any) at  9:06 PM on September 23, 2014.  The patient appears stable so that the remainder of the MSE may be completed by another provider.  Patient seen and evaluated in triage, he missed the curb and had a slip and fall hitting his right hand and right for head. He has a partial-thickness abrasion to the 4 head. He is tender to palpation around the orbit. He has extraocular movements that is full with no diplopia or pain. He is not anticoagulated, there was no loss of consciousness. Shot is up-to-date. Imaging ordered.  Wynetta Emery, PA-C 09/23/14 2108  Vanetta Mulders, MD 09/25/14 [phone]
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
## 2014-09-24 ENCOUNTER — Other Ambulatory Visit: Payer: Self-pay | Admitting: Internal Medicine

## 2014-09-24 MED ORDER — TRAMADOL HCL 50 MG PO TABS: 50.0000 mg | ORAL_TABLET | Freq: Four times a day (QID) | ORAL | Status: DC | PRN

## 2014-09-24 MED ORDER — HYDROCODONE-ACETAMINOPHEN 5-325 MG PO TABS
1.0000 | ORAL_TABLET | Freq: Once | ORAL | Status: DC
Start: 2014-09-24 — End: 2014-09-24

## 2014-09-24 MED ORDER — TRAMADOL HCL 50 MG PO TABS
50.0000 mg | ORAL_TABLET | Freq: Once | ORAL | Status: AC
  Administered 2014-09-24: 50 mg via ORAL
  Filled 2014-09-24: qty 1

## 2014-09-24 NOTE — ED Notes (Signed)
Pt left at this time with all belongings. States he has a ride home

## 2014-09-24 NOTE — Discharge Instructions (Signed)
Call and make appointment to follow-up with the hand surgeon in one week.   Abrasion An abrasion is a cut or scrape of the skin. Abrasions do not extend through all layers of the skin and most heal within 10 days. It is important to care for your abrasion properly to prevent infection. CAUSES  Most abrasions are caused by falling on, or gliding across, the ground or other surface. When your skin rubs on something, the outer and inner layer of skin rubs off, causing an abrasion. DIAGNOSIS  Your caregiver will be able to diagnose an abrasion during a physical exam.  TREATMENT  Your treatment depends on how large and deep the abrasion is. Generally, your abrasion will be cleaned with water and a mild soap to remove any dirt or debris. An antibiotic ointment may be put over the abrasion to prevent an infection. A bandage (dressing) may be wrapped around the abrasion to keep it from getting dirty.  You may need a tetanus shot if:  You cannot remember when you had your last tetanus shot.  You have never had a tetanus shot.  The injury broke your skin. If you get a tetanus shot, your arm may swell, get red, and feel warm to the touch. This is common and not a problem. If you need a tetanus shot and you choose not to have one, there is a rare chance of getting tetanus. Sickness from tetanus can be serious.  HOME CARE INSTRUCTIONS   If a dressing was applied, change it at least once a day or as directed by your caregiver. If the bandage sticks, soak it off with warm water.   Wash the area with water and a mild soap to remove all the ointment 2 times a day. Rinse off the soap and pat the area dry with a clean towel.   Reapply any ointment as directed by your caregiver. This will help prevent infection and keep the bandage from sticking. Use gauze over the wound and under the dressing to help keep the bandage from sticking.   Change your dressing right away if it becomes wet or dirty.   Only  take over-the-counter or prescription medicines for pain, discomfort, or fever as directed by your caregiver.   Follow up with your caregiver within 24-48 hours for a wound check, or as directed. If you were not given a wound-check appointment, look closely at your abrasion for redness, swelling, or pus. These are signs of infection. SEEK IMMEDIATE MEDICAL CARE IF:   You have increasing pain in the wound.   You have redness, swelling, or tenderness around the wound.   You have pus coming from the wound.   You have a fever or persistent symptoms for more than 2-3 days.  You have a fever and your symptoms suddenly get worse.  You have a bad smell coming from the wound or dressing.  MAKE SURE YOU:   Understand these instructions.  Will watch your condition.  Will get help right away if you are not doing well or get worse. Document Released: 11/01/2004 Document Revised: 01/09/2012 Document Reviewed: 12/26/2010 [facility] Patient Information 2015 [place], [place]. This information is not intended to replace advice given to you by your health care provider. Make sure you discuss any questions you have with your health care provider.  Finger Fracture Fractures of fingers are breaks in the bones of the fingers. There are many types of fractures. There are different ways of treating these fractures. Your health care  provider will discuss the best way to treat your fracture. CAUSES Traumatic injury is the main cause of broken fingers. These include:  Injuries while playing sports.  Workplace injuries.  Falls. RISK FACTORS Activities that can increase your risk of finger fractures include:  Sports.  Workplace activities that involve machinery.  A condition called osteoporosis, which can make your bones less dense and cause them to fracture more easily. SIGNS AND SYMPTOMS The main symptoms of a broken finger are pain and swelling within 15 minutes after the injury. Other symptoms  include:  Bruising of your finger.  Stiffness of your finger.  Numbness of your finger.  Exposed bones (compound fracture) if the fracture is severe. DIAGNOSIS  The best way to diagnose a broken bone is with X-ray imaging. Additionally, your health care provider will use this X-ray image to evaluate the position of the broken finger bones.  TREATMENT  Finger fractures can be treated with:   Nonreduction--This means the bones are in place. The finger is splinted without changing the positions of the bone pieces. The splint is usually left on for about a week to 10 days. This will depend on your fracture and what your health care provider thinks.  Closed reduction--The bones are put back into position without using surgery. The finger is then splinted.  Open reduction and internal fixation--The fracture site is opened. Then the bone pieces are fixed into place with pins or some type of hardware. This is seldom required. It depends on the severity of the fracture. HOME CARE INSTRUCTIONS   Follow your health care provider's instructions regarding activities, exercises, and physical therapy.  Only take over-the-counter or prescription medicines for pain, discomfort, or fever as directed by your health care provider. SEEK MEDICAL CARE IF: You have pain or swelling that limits the motion or use of your fingers. SEEK IMMEDIATE MEDICAL CARE IF:  Your finger becomes numb. MAKE SURE YOU:   Understand these instructions.  Will watch your condition.  Will get help right away if you are not doing well or get worse. Document Released: 05/06/2000 Document Revised: 11/12/2012 Document Reviewed: 09/03/2012 [facility] Patient Information 2015 [place], [place]. This information is not intended to replace advice given to you by your health care provider. Make sure you discuss any questions you have with your health care provider.   Head Injury You have received a head injury. It does not appear  serious at this time. Headaches and vomiting are common following head injury. It should be easy to awaken from sleeping. Sometimes it is necessary for you to stay in the emergency department for a while for observation. Sometimes admission to the hospital may be needed. After injuries such as yours, most problems occur within the first 24 hours, but side effects may occur up to 7-10 days after the injury. It is important for you to carefully monitor your condition and contact your health care provider or seek immediate medical care if there is a change in your condition. WHAT ARE THE TYPES OF HEAD INJURIES? Head injuries can be as minor as a bump. Some head injuries can be more severe. More severe head injuries include:  A jarring injury to the brain (concussion).  A bruise of the brain (contusion). This mean there is bleeding in the brain that can cause swelling.  A cracked skull (skull fracture).  Bleeding in the brain that collects, clots, and forms a bump (hematoma). WHAT CAUSES A HEAD INJURY? A serious head injury is most likely to  happen to someone who is in a car wreck and is not wearing a seat belt. Other causes of major head injuries include bicycle or motorcycle accidents, sports injuries, and falls. HOW ARE HEAD INJURIES DIAGNOSED? A complete history of the event leading to the injury and your current symptoms will be helpful in diagnosing head injuries. Many times, pictures of the brain, such as CT or MRI are needed to see the extent of the injury. Often, an overnight hospital stay is necessary for observation.  WHEN SHOULD I SEEK IMMEDIATE MEDICAL CARE?  You should get help right away if:  You have confusion or drowsiness.  You feel sick to your stomach (nauseous) or have continued, forceful vomiting.  You have dizziness or unsteadiness that is getting worse.  You have severe, continued headaches not relieved by medicine. Only take over-the-counter or prescription medicines for  pain, fever, or discomfort as directed by your health care provider.  You do not have normal function of the arms or legs or are unable to walk.  You notice changes in the black spots in the center of the colored part of your eye (pupil).  You have a clear or bloody fluid coming from your nose or ears.  You have a loss of vision. During the next 24 hours after the injury, you must stay with someone who can watch you for the warning signs. This person should contact local emergency services (911 in the [place] if you have seizures, you become unconscious, or you are unable to wake up. HOW CAN I PREVENT A HEAD INJURY IN THE FUTURE? The most important factor for preventing major head injuries is avoiding motor vehicle accidents. To minimize the potential for damage to your head, it is crucial to wear seat belts while riding in motor vehicles. Wearing helmets while bike riding and playing collision sports (like football) is also helpful. Also, avoiding dangerous activities around the house will further help reduce your risk of head injury.  WHEN CAN I RETURN TO NORMAL ACTIVITIES AND ATHLETICS? You should be reevaluated by your health care provider before returning to these activities. If you have any of the following symptoms, you should not return to activities or contact sports until 1 week after the symptoms have stopped:  Persistent headache.  Dizziness or vertigo.  Poor attention and concentration.  Confusion.  Memory problems.  Nausea or vomiting.  Fatigue or tire easily.  Irritability.  Intolerant of bright lights or loud noises.  Anxiety or depression.  Disturbed sleep. MAKE SURE YOU:   Understand these instructions.  Will watch your condition.  Will get help right away if you are not doing well or get worse. Document Released: 01/22/2005 Document Revised: 01/27/2013 Document Reviewed: 09/29/2012 [facility] Patient Information 2015 [place], [place]. This information is not  intended to replace advice given to you by your health care provider. Make sure you discuss any questions you have with your health care provider.

## 2014-09-24 NOTE — ED Notes (Signed)
Received call from wife stating pt is alcoholic, got out of rehab yesterday and returned to drinking again. Concerned for narcotic usage. Reassured pt's wife and notified MD of conversation.

## 2014-10-05 ENCOUNTER — Encounter: Payer: Self-pay | Admitting: Internal Medicine

## 2014-10-12 ENCOUNTER — Telehealth: Payer: Self-pay | Admitting: Behavioral Health

## 2014-10-12 ENCOUNTER — Encounter: Payer: Self-pay | Admitting: Behavioral Health

## 2014-10-12 NOTE — Telephone Encounter (Signed)
Pre-Visit Call completed with patient and chart updated.   Pre-Visit Info documented in Specialty Comments under [employer].    

## 2014-10-13 ENCOUNTER — Ambulatory Visit (HOSPITAL_BASED_OUTPATIENT_CLINIC_OR_DEPARTMENT_OTHER)
Admission: RE | Admit: 2014-10-13 | Discharge: 2014-10-13 | Disposition: A | Discharge status: Home / Self Care | Payer: BLUE CROSS/BLUE SHIELD | Source: Ambulatory Visit | Attending: Internal Medicine | Admitting: Internal Medicine

## 2014-10-13 ENCOUNTER — Telehealth: Payer: Self-pay | Admitting: Internal Medicine

## 2014-10-13 ENCOUNTER — Ambulatory Visit (INDEPENDENT_AMBULATORY_CARE_PROVIDER_SITE_OTHER): Payer: BLUE CROSS/BLUE SHIELD | Admitting: Internal Medicine

## 2014-10-13 ENCOUNTER — Encounter: Payer: Self-pay | Admitting: Internal Medicine

## 2014-10-13 VITALS — BP 126/74 | HR 78 | Temp 98.1°F | Ht 67.0 in | Wt 285.4 lb

## 2014-10-13 DIAGNOSIS — F329 Major depressive disorder, single episode, unspecified: Secondary | ICD-10-CM

## 2014-10-13 DIAGNOSIS — F419 Anxiety disorder, unspecified: Secondary | ICD-10-CM

## 2014-10-13 DIAGNOSIS — Z23 Encounter for immunization: Secondary | ICD-10-CM

## 2014-10-13 DIAGNOSIS — F1010 Alcohol abuse, uncomplicated: Secondary | ICD-10-CM

## 2014-10-13 DIAGNOSIS — F418 Other specified anxiety disorders: Secondary | ICD-10-CM

## 2014-10-13 DIAGNOSIS — Z0000 Encounter for general adult medical examination without abnormal findings: Secondary | ICD-10-CM | POA: Diagnosis not present

## 2014-10-13 DIAGNOSIS — W19XXXD Unspecified fall, subsequent encounter: Secondary | ICD-10-CM

## 2014-10-13 DIAGNOSIS — Z114 Encounter for screening for human immunodeficiency virus [HIV]: Secondary | ICD-10-CM

## 2014-10-13 DIAGNOSIS — E119 Type 2 diabetes mellitus without complications: Secondary | ICD-10-CM | POA: Diagnosis not present

## 2014-10-13 DIAGNOSIS — K760 Fatty (change of) liver, not elsewhere classified: Secondary | ICD-10-CM

## 2014-10-13 DIAGNOSIS — G44319 Acute post-traumatic headache, not intractable: Secondary | ICD-10-CM

## 2014-10-13 DIAGNOSIS — F32A Depression, unspecified: Secondary | ICD-10-CM

## 2014-10-13 DIAGNOSIS — Z09 Encounter for follow-up examination after completed treatment for conditions other than malignant neoplasm: Secondary | ICD-10-CM | POA: Insufficient documentation

## 2014-10-13 LAB — CBC WITH DIFFERENTIAL/PLATELET
BASOS PCT: 0.6 % (ref 0.0–3.0)
Basophils Absolute: 0 10*3/uL (ref 0.0–0.1)
EOS ABS: 0.1 10*3/uL (ref 0.0–0.7)
Eosinophils Relative: 3.1 % (ref 0.0–5.0)
HCT: 45.1 % (ref 39.0–52.0)
Hemoglobin: 15.5 g/dL (ref 13.0–17.0)
LYMPHS ABS: 1.9 10*3/uL (ref 0.7–4.0)
Lymphocytes Relative: 40.2 % (ref 12.0–46.0)
MCHC: 34.4 g/dL (ref 30.0–36.0)
MCV: 100 fl (ref 78.0–100.0)
MONO ABS: 0.5 10*3/uL (ref 0.1–1.0)
Monocytes Relative: 10.8 % (ref 3.0–12.0)
NEUTROS ABS: 2.1 10*3/uL (ref 1.4–7.7)
NEUTROS PCT: 45.3 % (ref 43.0–77.0)
PLATELETS: 97 10*3/uL — AB (ref 150.0–400.0)
RBC: 4.51 Mil/uL (ref 4.22–5.81)
RDW: 13.4 % (ref 11.5–15.5)
WBC: 4.6 10*3/uL (ref 4.0–10.5)

## 2014-10-13 LAB — COMPREHENSIVE METABOLIC PANEL
ALT: 47 U/L (ref 0–53)
AST: 49 U/L — AB (ref 0–37)
Albumin: 3.7 g/dL (ref 3.5–5.2)
Alkaline Phosphatase: 122 U/L — ABNORMAL HIGH (ref 39–117)
BUN: 10 mg/dL (ref 6–23)
CO2: 27 meq/L (ref 19–32)
Calcium: 10.1 mg/dL (ref 8.4–10.5)
Chloride: 101 mEq/L (ref 96–112)
Creatinine, Ser: 0.66 mg/dL (ref 0.40–1.50)
GFR: 136.14 mL/min (ref >60.00)
Glucose, Bld: 277 mg/dL — ABNORMAL HIGH (ref 70–99)
Potassium: 3.9 mEq/L (ref 3.5–5.1)
Sodium: 137 mEq/L (ref 135–145)
Total Bilirubin: 0.9 mg/dL (ref 0.2–1.2)
Total Protein: 7.5 g/dL (ref 6.0–8.3)

## 2014-10-13 LAB — LIPID PANEL
CHOLESTEROL: 262 mg/dL — AB (ref 0–200)
HDL: 68.8 mg/dL (ref >39.00)
LDL CALC: 163 mg/dL — AB (ref 0–99)
NonHDL: 192.71
Total CHOL/HDL Ratio: 4
Triglycerides: 151 mg/dL — ABNORMAL HIGH (ref 0.0–149.0)
VLDL: 30.2 mg/dL (ref 0.0–40.0)

## 2014-10-13 LAB — HEMOGLOBIN A1C: HEMOGLOBIN A1C: 6.3 % (ref 4.6–6.5)

## 2014-10-13 NOTE — Progress Notes (Signed)
Pre visit review using our clinic review tool, if applicable. No additional management support is needed unless otherwise documented below in the visit note. 

## 2014-10-13 NOTE — Telephone Encounter (Signed)
Pt called stating his wife is insisting that he needs to have a toxicology screening to check for drugs and alcohol. Please advise.
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
## 2014-10-13 NOTE — Addendum Note (Signed)
Addended by: Dorette Grate on: 10/13/2014 04:58 PM   Modules accepted: Orders

## 2014-10-13 NOTE — Progress Notes (Signed)
Subjective:    Patient ID: Ricky Molina, male    DOB: 1965-12-13, 49 y.o.   MRN: [id number]  DOS:  10/13/2014 Type of visit - description : Complete physical exam Interval history: Alcohol abuse: Status post IP detox admission, still drinking Had 2 head injuries : first was 2 months ago and also  last month when he was admitted to inpatient detox program, no loss of consciousness but he was in a daze for a while. Since then, he is having 30 seconds episodes when he is absent, not really connected, cannot recall anything. No associated tongue bite, bladder or bowel incontinence, convulsions. Apparently not postictal. Also having headaches persistently since then, no associated nausea or vomiting.   Review of Systems Constitutional: No fever. No chills. No unexplained wt changes. No unusual sweats  HEENT: No dental problems, no ear discharge, no facial swelling, no voice changes. No eye discharge, no eye  redness , no  intolerance to light   Respiratory: No wheezing , no  difficulty breathing. No cough , no mucus production  Cardiovascular: No CP, no leg swelling , no  Palpitations  GI: no nausea, no vomiting, no diarrhea , no  abdominal pain.  No blood in the stools. No dysphagia, no odynophagia    Endocrine: No polyphagia, no polyuria , no polydipsia  GU: No dysuria, gross hematuria, difficulty urinating. No urinary urgency, no frequency.  Musculoskeletal: No joint swellings or unusual aches or pains  Skin: No change in the color of the skin, palor , no  Rash  Allergic, immunologic: No environmental allergies , no  food allergies  Neurological: See history of present illness. Denies neck pain or neck stiffness.   No diplopia, no slurred, no slurred speech, no motor deficits, no facial  Numbness  Hematological: No enlarged lymph nodes, no easy bruising , no unusual bleedings  Psychiatry: No suicidal ideas, no hallucinations, no beavior problems, no confusion.  Good  compliance with medications, symptoms well-controlled   Past Medical History  Diagnosis Date  . Hyperlipidemia   . Allergic rhinitis   . OSA on CPAP   . Diverticulitis 03/23/2011    possible  . Alcoholism 03/24/2011  . Hepatic steatosis 03/25/2011  . Ascites     mild on CT 03-23-11  . Anxiety and depression 12/19/2012  . Elevated BP   . Diabetes   . Alcoholism /alcohol abuse     Past Surgical History  Procedure Laterality Date  . Appendectomy  1997    Social History   Social History  . Marital Status: Married    Spouse Name: Lawson Fiscal  . Number of Children: 2  . Years of Education: N/A   Occupational History  . Surveyor, minerals--   working     Social History Main Topics  . Smoking status: Former Smoker -- 1.00 packs/day for 30 years    Types: Cigarettes    Quit date: 01/20/2011  . Smokeless tobacco: Never Used  . Alcohol Use: 0.0 oz/week    0 Standard drinks or equivalent per week     Comment:    . Drug Use: No  . Sexual Activity: No   Other Topics Concern  . Not on file   Social History Narrative   Married with 2 sons one daughter.   Surveyor, minerals   Wife disabled, blind              Family History  Problem Relation Age of Onset  . Emphysema Mother   . Hypertension  Mother   . Skin cancer Mother     nose  . Alcohol abuse Mother   . Breast cancer Mother   . Allergies Sister   . Heart failure Father   . Stroke Father   . Rheum arthritis Father   . Diabetes Father   . Alcohol abuse Father   . Arthritis Father   . Hyperlipidemia Father   . Heart disease Father     age 66s  . Hypertension Father   . Hypothyroidism Sister   . Colon cancer Neg Hx   . Prostate cancer Neg Hx        Medication List       This list is accurate as of: 10/13/14  1:16 PM.  Always use your most recent med list.               atenolol 25 MG tablet  Commonly known as:  TENORMIN  Take 25 mg by mouth daily.     buPROPion 300 MG 24 hr tablet  Commonly known as:   WELLBUTRIN XL  Take 300 mg by mouth daily.     escitalopram 20 MG tablet  Commonly known as:  LEXAPRO  Take 1 tablet (20 mg total) by mouth daily.     glimepiride 4 MG tablet  Commonly known as:  AMARYL  Take 1 and 1/2 tablet (6 mg total) by mouth daily.     NON FORMULARY  at bedtime. cpap           Objective:   Physical Exam BP 126/74 mmHg  Pulse 78  Temp(Src) 98.1 F (36.7 C) (Oral)  Ht  (1.702 m)  Wt 285 lb 6 oz (129.445 kg)  BMI 44.69 kg/m2  SpO2 96% General:   Well developed, well nourished . NAD.  Neck:  Full range of motion. Supple. No  Thyromegaly HEENT:  Normocephalic . Face symmetric, atraumatic. EOMI Lungs:  CTA B Normal respiratory effort, no intercostal retractions, no accessory muscle use. Heart: RRR,  no murmur.  +/+++ pretibial edema bilaterally  Abdomen:  Not distended, soft, non-tender. No rebound or rigidity. No mass,organomegaly Skin: Exposed areas without rash. Not pale. Not jaundice Neurologic:  alert & oriented X3.  Speech normal, gait appropriate for age and unassisted Strength symmetric and appropriate for age.  DTRs symmetric. Minimal hand tremor noted. Psych: Cognition and judgment appear intact.  Cooperative with normal attention span and concentration.  Behavior appropriate. No anxious or depressed appearing.    Assessment & Plan:   Problem list > Diabetes Elevated BP Hyperlipidemia OSA, CPAP Anxiety, depression Alcohol abuse, alcohol hepatitis Thrombocytopenia GI: --Diverticulitis --Fatty liver  Hep B and C neg 2013 , CT abd 2015 noemal liver    A/P Head contusion: s/p head injury x 2, now w/ daily HAs, episodes of feeling absent >>>> CT head now and refer to neuro d/t episodes of feeling absent DDX: SZ, hypoglycemia? (although his wife check CBGs after episodes--180) DM: check A1C Anxiety depression: On Wellbutrin 300 mg and Lexapro 20 mg daily, symptoms well-controlled.  EtOH: s/p IP admission 09-2014, "did  not work", now " drinking in moderation" Fatty Liver : last platelet count low, CT abd 2015 w/ no changes of cirrhosis, will mnitor CBCs  In addition to his physical exam we spend a significant amount of time today handling an acute problem (head concussion) and 3 chronic medical problems.

## 2014-10-13 NOTE — Telephone Encounter (Signed)
Please inform Pt's wife that we have ordered the drug screen, however, we did not collect any urine at the visit. The Pt will need to return to the office to give [place] a urine sample for testing.

## 2014-10-13 NOTE — Telephone Encounter (Signed)
Please advise 

## 2014-10-13 NOTE — Patient Instructions (Signed)
Get your blood work before you leave   we are scheduling a CT of the head    Next visit  for a routine visit in 3 months, 30 minutes Please schedule an appointment at the front desk No need to come back fasting

## 2014-10-13 NOTE — Telephone Encounter (Signed)
Okay, please add a UDS

## 2014-10-13 NOTE — Assessment & Plan Note (Addendum)
Td 2013; prevnar today; pnm shot next year  flu shot today CCS: No previous colonoscopies Prostate cancer screening: Not indicated  Diet-exercise discussed  Labs

## 2014-10-14 LAB — HIV ANTIBODY (ROUTINE TESTING W REFLEX): HIV 1&2 Ab, 4th Generation: NONREACTIVE

## 2014-10-14 NOTE — Telephone Encounter (Signed)
Left msg for pt to call and scheduled appt for urine screen

## 2014-10-15 ENCOUNTER — Other Ambulatory Visit: Payer: BLUE CROSS/BLUE SHIELD

## 2014-10-18 MED ORDER — EZETIMIBE 10 MG PO TABS: 10.0000 mg | ORAL_TABLET | Freq: Every day | ORAL | Status: AC

## 2014-10-18 NOTE — Addendum Note (Signed)
Addended by: Dorette Grate on: 10/18/2014 10:15 AM   Modules accepted: Orders

## 2014-11-23 ENCOUNTER — Ambulatory Visit: Payer: Self-pay | Admitting: Neurology

## 2015-01-05 ENCOUNTER — Ambulatory Visit (INDEPENDENT_AMBULATORY_CARE_PROVIDER_SITE_OTHER): Payer: BLUE CROSS/BLUE SHIELD | Admitting: Internal Medicine

## 2015-01-05 ENCOUNTER — Encounter: Payer: Self-pay | Admitting: Internal Medicine

## 2015-01-05 VITALS — BP 162/90 | HR 103

## 2015-01-05 DIAGNOSIS — G4733 Obstructive sleep apnea (adult) (pediatric): Secondary | ICD-10-CM

## 2015-01-05 NOTE — Assessment & Plan Note (Signed)
Machine is probably set on auto. We will respond to his request for a change of DME company. He seems to be using it consistently with good control.

## 2015-01-05 NOTE — Progress Notes (Signed)
   Subjective:    Patient ID: Ricky Molina, male    DOB: 01/01/1966, 49 y.o.   MRN: [id number]  HPI 02/24/2014 Dr Shelle Ironlance Patient comes in today for follow-up of his obstructive sleep apnea. He is wearing C Pap compliantly by his download, but is having some breakthrough apnea at times. His bed partner has noticed breakthrough snoring on occasion. The patient has been trying to keep up with his mask cushions more consistently, and is satisfied with his mask fit. He feels that he sleeps well with his device, and does not have issues with daytime sleepiness.  01/05/2015-49 year old male former smoker followed for OSA, HBP, DM NPSG 2012  AHI 52/ hr CPAP 14/[facility]  patient says machine is set on auto FOLLOWS FOR: Wears CPAP nightly x 6hrs. No download available- pt aware to take card to DME for data read. Pt requests change of DME.  Flu shot and pneumonia vaccine UTD Note BP 162/90 this visit Describes service problems with [facility]. Says he uses CPAP consistently but straps are worn out.  Review of Systems  Constitutional: Negative for fever and unexpected weight change.  HENT: Negative for congestion, dental problem, ear pain, nosebleeds, postnasal drip, rhinorrhea, sinus pressure, sneezing, sore throat and trouble swallowing.   Eyes: Negative for redness and itching.  Respiratory: Negative for cough, chest tightness, shortness of breath and wheezing.   Cardiovascular: Negative for palpitations and leg swelling.  Gastrointestinal: Negative for nausea and vomiting.  Genitourinary: Negative for dysuria.  Musculoskeletal: Negative for joint swelling.  Skin: Negative for rash.  Neurological: Negative for headaches.  Hematological: Does not bruise/bleed easily.  Psychiatric/Behavioral: Negative for dysphoric mood. The patient is not nervous/anxious.      Objective:   Physical Exam OBJ- Physical Exam General- Alert, Oriented, Affect-appropriate, Distress- none acute, + BP 160/92 discussed  with him Skin- rash-none, lesions- none, excoriation- none, + tattoos Lymphadenopathy- none Head- atraumatic            Eyes- Gross vision intact, PERRLA, conjunctivae and secretions clear            Ears- Hearing, canals-normal            Nose- Clear, no-Septal dev, mucus, polyps, erosion, perforation             Throat- Mallampati III-IV , mucosa clear , drainage- none, tonsils- atrophic Neck- flexible , trachea midline, no stridor , thyroid nl, carotid no bruit Chest - symmetrical excursion , unlabored           Heart/CV- RRR , no murmur , no gallop  , no rub, nl s1 s2                           - JVD- none , edema- none, stasis changes- none, varices- none           Lung- clear to P&A, wheeze- none, cough- none , dullness-none, rub- none           Chest wall-  Abd-  Br/ Gen/ Rectal- Not done, not indicated Extrem- cyanosis- none, clubbing, none, atrophy- none, strength- nl Neuro- grossly intact to observation         Assessment & Plan:

## 2015-01-05 NOTE — Patient Instructions (Signed)
Order-requests change DME to continue CPAP auto 5-20, mask of choice, humidifier, supplies, [facility]  DX OSA  Please call as needed

## 2015-01-26 ENCOUNTER — Telehealth: Payer: Self-pay | Admitting: Internal Medicine

## 2015-01-26 NOTE — Telephone Encounter (Signed)
Spoke with Pt's wife, [name], she informed me that Pt has not been taking any of his medications for several months. [name] informs me that he has restarted his Glimepiride 1-2 weeks ago, Pt refuses to check blood sugars but when [name] does get him to do it, the Glucometer won't read, states above 600. Pt refuses to go to ED or call our office during these times but [name] does give him 6 mg of Glimepiride when blood sugar is unreadable. [name] is worried that Pt does not give all information to Dr. Drue NovelPaz during his office visits. Informed that Pt does express wanting to hurt himself during excessive drinking and [name] is worried, she has spoke to Pt about getting seeing a therapist which Pt also refuses. She also informs that Pt never saw Neurology like recommended. [name] informed me that she can not come to visits with Pt due to her being legally blind and disabled. She informed me that Dr. Drue NovelPaz can call her back at the listed number below if he wants to discuss anything further. Informed her I would forward this information to him.

## 2015-01-26 NOTE — Telephone Encounter (Signed)
Caller name:Pata,Lori Relation to pt: spouse  Call back [phone]   Reason for call:  Spouse would like to talk with you regarding patient upcoming appointment 01/28/15. Spouse did not want to elaborate, spouse states patient leaves out information when he sees PCP. Please follow up with spouse directly.

## 2015-01-26 NOTE — Telephone Encounter (Signed)
Noted, I'll advise him the best i can

## 2015-01-28 ENCOUNTER — Encounter: Payer: Self-pay | Admitting: Internal Medicine

## 2015-01-28 ENCOUNTER — Ambulatory Visit (INDEPENDENT_AMBULATORY_CARE_PROVIDER_SITE_OTHER): Payer: BLUE CROSS/BLUE SHIELD | Admitting: Internal Medicine

## 2015-01-28 VITALS — BP 132/76 | HR 95 | Temp 97.9°F | Ht 67.0 in | Wt 279.5 lb

## 2015-01-28 DIAGNOSIS — F329 Major depressive disorder, single episode, unspecified: Secondary | ICD-10-CM

## 2015-01-28 DIAGNOSIS — E118 Type 2 diabetes mellitus with unspecified complications: Secondary | ICD-10-CM

## 2015-01-28 DIAGNOSIS — W19XXXD Unspecified fall, subsequent encounter: Secondary | ICD-10-CM | POA: Diagnosis not present

## 2015-01-28 DIAGNOSIS — F418 Other specified anxiety disorders: Secondary | ICD-10-CM | POA: Diagnosis not present

## 2015-01-28 DIAGNOSIS — F419 Anxiety disorder, unspecified: Secondary | ICD-10-CM

## 2015-01-28 DIAGNOSIS — Z09 Encounter for follow-up examination after completed treatment for conditions other than malignant neoplasm: Secondary | ICD-10-CM

## 2015-01-28 DIAGNOSIS — F1010 Alcohol abuse, uncomplicated: Secondary | ICD-10-CM

## 2015-01-28 LAB — POCT CBG (FASTING - GLUCOSE)-MANUAL ENTRY: GLUCOSE FASTING, POC: 242 mg/dL — AB (ref 70–99)

## 2015-01-28 NOTE — Progress Notes (Signed)
Pre visit review using our clinic review tool, if applicable. No additional management support is needed unless otherwise documented below in the visit note. 

## 2015-01-28 NOTE — Progress Notes (Signed)
Subjective:    Patient ID: Ricky Molina., male    DOB: [birth year], 49 y.o.   MRN: [id number]  DOS:  01/28/2015 Type of visit - description : Routine checkup Interval history: See phone the patient's wife: reportedly pt still drinking, not taking medications, had suicidal ideas, CBGs extremely high. Today he reports the only medication is taking his glimepiride. Last drink a month ago. (later on he admitted he still drinking)    Review of Systems Denies chest pain or difficulty breathing. No nausea or vomiting. Strongly denies suicidal ideas to me.  Past Medical History  Diagnosis Date  . Hyperlipidemia   . Allergic rhinitis   . OSA on CPAP   . Diverticulitis 03/23/2011    possible  . Alcoholism (HCC) 03/24/2011  . Hepatic steatosis 03/25/2011  . Ascites     mild on CT 03-23-11  . Anxiety and depression 12/19/2012  . Elevated BP   . Diabetes (HCC)   . Alcoholism /alcohol abuse [facility])     Past Surgical History  Procedure Laterality Date  . Appendectomy  1997    Social History   Social History  . Marital Status: Married    Spouse Name: Lawson Fiscal  . Number of Children: 2  . Years of Education: N/A   Occupational History  . Surveyor, minerals--   working     Social History Main Topics  . Smoking status: Former Smoker -- 1.00 packs/day for 30 years    Types: Cigarettes    Quit date: 01/20/2011  . Smokeless tobacco: Never Used  . Alcohol Use: 0.0 oz/week    0 Standard drinks or equivalent per week     Comment:    . Drug Use: No  . Sexual Activity: No   Other Topics Concern  . Not on file   Social History Narrative   Married with 2 sons one daughter.   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  Always use your most recent med list.               escitalopram 20 MG tablet  Commonly known as:  LEXAPRO  Take 1 tablet (20 mg total) by mouth daily.     ezetimibe 10 MG  tablet  Commonly known as:  ZETIA  Take 1 tablet (10 mg total) by mouth daily.     glimepiride 4 MG tablet  Commonly known as:  AMARYL  Take 1 and 1/2 tablet (6 mg total) by mouth daily.     NON FORMULARY  at bedtime. Reported on 01/28/2015           Objective:   Physical Exam BP 132/76 mmHg  Pulse 95  Temp(Src) 97.9 F (36.6 C) (Oral)  Ht  (1.702 m)  Wt 279 lb 8 oz (126.78 kg)  BMI 43.77 kg/m2  SpO2 95% General:   Well developed, well nourished . NAD.  HEENT:  Normocephalic . Face symmetric, atraumatic Lungs:  CTA B Normal respiratory effort, no intercostal retractions, no accessory muscle use. Heart: RRR,  no murmur.  No pretibial edema bilaterally  Skin: Not pale. Not jaundice Neurologic:  alert & oriented X3.  Speech normal, gait appropriate for age and unassisted Psych--  Cognition and judgment appear intact.  Cooperative with normal attention span and concentration.  Behavior appropriate.  No anxious or depressed appearing.      Assessment & Plan:   Assessment . Diabetes Elevated BP Hyperlipidemia OSA, CPAP Anxiety, depression Alcohol abuse, alcohol hepatitis Thrombocytopenia GI: --Diverticulitis --Fatty liver  Hep B and C neg 2013 , CT abd 2015 noemal liver  PLAN: See recent phone call from wife. Head contusion (02-6107(10-2014): CT negative, did not see neurology, now asx DM: Last A1c satisfactory, reports he takes glimepiride daily, CBG sometimes extremely high, CBG today 242. Rec to watch diet, check CBGs daily. Elevated BP: Not taking beta blockers  High cholesterol: Prescribed Zetia base on last FLP, not taking it, encouraged to do Anxiety depression: Currently on no medications, denies suicidal ideas. We agreed to go back to at least on Lexapro 20 mg. See instructions EtOH: Reports the last time he drank was a month ago, after I told him i smell etoh he admitted had drunk last night. I again encouraged him to seek counseling.   RTC 3  months

## 2015-01-28 NOTE — Patient Instructions (Addendum)
Recommend to take Zetia, glimepiride, and go back on Lexapro . For the next 4 weeks take Lexapro only half tablet daily, then one full tablet.  Diabetes: Check your blood sugar  once a day   Check your blood sugar  at different times of the day  GOALS: Fasting before a meal 70- 130 2 hours after a meal less than 180 At bedtime 90-150 Call if consistently not at goal   Next visit in 3 months, please make an appointment.

## 2015-01-31 NOTE — Assessment & Plan Note (Signed)
See recent phone call from wife. Head contusion ([phone](10-2014): CT negative, did not see neurology, now asx DM: Last A1c satisfactory, reports he takes glimepiride daily, CBG sometimes extremely high, CBG today 242. Rec to watch diet, check CBGs daily. Elevated BP: Not taking beta blockers  High cholesterol: Prescribed Zetia base on last FLP, not taking it, encouraged to do Anxiety depression: Currently on no medications, denies suicidal ideas. We agreed to go back to at least on Lexapro 20 mg. See instructions EtOH: Reports the last time he drank was a month ago, after I told him i smell etoh he admitted had drunk last night. I again encouraged him to seek counseling.   RTC 3 months

## 2015-02-16 ENCOUNTER — Telehealth: Payer: Self-pay | Admitting: Internal Medicine

## 2015-02-16 NOTE — Telephone Encounter (Signed)
No labs were completed here on 01/28/2015. We did do a glucose finger stick and Pt was informed of results at appt.

## 2015-02-16 NOTE — Telephone Encounter (Signed)
Wife did say it was relative to blood sugar. Perhaps the pt has forgotten?

## 2015-02-16 NOTE — Telephone Encounter (Signed)
Pt wife called stating they would like call with lab results from 01/28/15. She states they did not get a call after.

## 2015-02-25 ENCOUNTER — Ambulatory Visit: Payer: Self-pay | Admitting: Pulmonary Disease

## 2015-03-03 ENCOUNTER — Ambulatory Visit: Payer: Self-pay | Admitting: Internal Medicine

## 2015-03-05 ENCOUNTER — Emergency Department (HOSPITAL_COMMUNITY): Payer: BLUE CROSS/BLUE SHIELD

## 2015-03-05 ENCOUNTER — Emergency Department (HOSPITAL_COMMUNITY): Payer: BLUE CROSS/BLUE SHIELD | Admitting: Anesthesiology

## 2015-03-05 ENCOUNTER — Encounter (HOSPITAL_COMMUNITY): Payer: Self-pay | Admitting: Radiology

## 2015-03-05 ENCOUNTER — Inpatient Hospital Stay (HOSPITAL_COMMUNITY)
Admission: EM | Admit: 2015-03-05 | Discharge: 2015-05-07 | DRG: 393 | Disposition: E | Payer: BLUE CROSS/BLUE SHIELD | Attending: General Surgery | Admitting: General Surgery

## 2015-03-05 ENCOUNTER — Encounter (HOSPITAL_COMMUNITY): Admission: EM | Disposition: E | Payer: Self-pay | Source: Home / Self Care

## 2015-03-05 DIAGNOSIS — A419 Sepsis, unspecified organism: Secondary | ICD-10-CM | POA: Diagnosis not present

## 2015-03-05 DIAGNOSIS — J154 Pneumonia due to other streptococci: Secondary | ICD-10-CM | POA: Diagnosis not present

## 2015-03-05 DIAGNOSIS — K7290 Hepatic failure, unspecified without coma: Secondary | ICD-10-CM | POA: Diagnosis not present

## 2015-03-05 DIAGNOSIS — R069 Unspecified abnormalities of breathing: Secondary | ICD-10-CM

## 2015-03-05 DIAGNOSIS — Z6841 Body Mass Index (BMI) 40.0 and over, adult: Secondary | ICD-10-CM | POA: Diagnosis not present

## 2015-03-05 DIAGNOSIS — R402142 Coma scale, eyes open, spontaneous, at arrival to emergency department: Secondary | ICD-10-CM | POA: Diagnosis present

## 2015-03-05 DIAGNOSIS — Z87891 Personal history of nicotine dependence: Secondary | ICD-10-CM

## 2015-03-05 DIAGNOSIS — J9600 Acute respiratory failure, unspecified whether with hypoxia or hypercapnia: Secondary | ICD-10-CM | POA: Diagnosis not present

## 2015-03-05 DIAGNOSIS — S31613A Laceration without foreign body of abdominal wall, right lower quadrant with penetration into peritoneal cavity, initial encounter: Principal | ICD-10-CM | POA: Diagnosis present

## 2015-03-05 DIAGNOSIS — E870 Hyperosmolality and hypernatremia: Secondary | ICD-10-CM | POA: Diagnosis not present

## 2015-03-05 DIAGNOSIS — F10239 Alcohol dependence with withdrawal, unspecified: Secondary | ICD-10-CM | POA: Diagnosis present

## 2015-03-05 DIAGNOSIS — R7981 Abnormal blood-gas level: Secondary | ICD-10-CM

## 2015-03-05 DIAGNOSIS — G9340 Encephalopathy, unspecified: Secondary | ICD-10-CM | POA: Insufficient documentation

## 2015-03-05 DIAGNOSIS — K7040 Alcoholic hepatic failure without coma: Secondary | ICD-10-CM | POA: Diagnosis present

## 2015-03-05 DIAGNOSIS — Z9114 Patient's other noncompliance with medication regimen: Secondary | ICD-10-CM

## 2015-03-05 DIAGNOSIS — Y95 Nosocomial condition: Secondary | ICD-10-CM | POA: Diagnosis not present

## 2015-03-05 DIAGNOSIS — E1165 Type 2 diabetes mellitus with hyperglycemia: Secondary | ICD-10-CM | POA: Diagnosis present

## 2015-03-05 DIAGNOSIS — J189 Pneumonia, unspecified organism: Secondary | ICD-10-CM

## 2015-03-05 DIAGNOSIS — J9690 Respiratory failure, unspecified, unspecified whether with hypoxia or hypercapnia: Secondary | ICD-10-CM

## 2015-03-05 DIAGNOSIS — F419 Anxiety disorder, unspecified: Secondary | ICD-10-CM | POA: Diagnosis present

## 2015-03-05 DIAGNOSIS — E669 Obesity, unspecified: Secondary | ICD-10-CM | POA: Diagnosis present

## 2015-03-05 DIAGNOSIS — J811 Chronic pulmonary edema: Secondary | ICD-10-CM | POA: Diagnosis not present

## 2015-03-05 DIAGNOSIS — K7682 Hepatic encephalopathy: Secondary | ICD-10-CM | POA: Diagnosis present

## 2015-03-05 DIAGNOSIS — Z515 Encounter for palliative care: Secondary | ICD-10-CM | POA: Diagnosis not present

## 2015-03-05 DIAGNOSIS — K567 Ileus, unspecified: Secondary | ICD-10-CM | POA: Diagnosis not present

## 2015-03-05 DIAGNOSIS — D62 Acute posthemorrhagic anemia: Secondary | ICD-10-CM | POA: Diagnosis present

## 2015-03-05 DIAGNOSIS — R34 Anuria and oliguria: Secondary | ICD-10-CM | POA: Diagnosis not present

## 2015-03-05 DIAGNOSIS — S3681XA Injury of peritoneum, initial encounter: Secondary | ICD-10-CM | POA: Diagnosis present

## 2015-03-05 DIAGNOSIS — Z7189 Other specified counseling: Secondary | ICD-10-CM | POA: Diagnosis not present

## 2015-03-05 DIAGNOSIS — K766 Portal hypertension: Secondary | ICD-10-CM | POA: Diagnosis present

## 2015-03-05 DIAGNOSIS — D709 Neutropenia, unspecified: Secondary | ICD-10-CM | POA: Diagnosis present

## 2015-03-05 DIAGNOSIS — R0989 Other specified symptoms and signs involving the circulatory and respiratory systems: Secondary | ICD-10-CM

## 2015-03-05 DIAGNOSIS — R402362 Coma scale, best motor response, obeys commands, at arrival to emergency department: Secondary | ICD-10-CM | POA: Diagnosis present

## 2015-03-05 DIAGNOSIS — J95850 Mechanical complication of respirator: Secondary | ICD-10-CM

## 2015-03-05 DIAGNOSIS — R6521 Severe sepsis with septic shock: Secondary | ICD-10-CM | POA: Diagnosis not present

## 2015-03-05 DIAGNOSIS — E785 Hyperlipidemia, unspecified: Secondary | ICD-10-CM | POA: Diagnosis present

## 2015-03-05 DIAGNOSIS — K7031 Alcoholic cirrhosis of liver with ascites: Secondary | ICD-10-CM | POA: Diagnosis present

## 2015-03-05 DIAGNOSIS — J9811 Atelectasis: Secondary | ICD-10-CM | POA: Diagnosis not present

## 2015-03-05 DIAGNOSIS — D638 Anemia in other chronic diseases classified elsewhere: Secondary | ICD-10-CM | POA: Diagnosis present

## 2015-03-05 DIAGNOSIS — E876 Hypokalemia: Secondary | ICD-10-CM | POA: Diagnosis not present

## 2015-03-05 DIAGNOSIS — F329 Major depressive disorder, single episode, unspecified: Secondary | ICD-10-CM | POA: Diagnosis present

## 2015-03-05 DIAGNOSIS — D6959 Other secondary thrombocytopenia: Secondary | ICD-10-CM | POA: Diagnosis present

## 2015-03-05 DIAGNOSIS — Z794 Long term (current) use of insulin: Secondary | ICD-10-CM | POA: Diagnosis not present

## 2015-03-05 DIAGNOSIS — Z9911 Dependence on respirator [ventilator] status: Secondary | ICD-10-CM

## 2015-03-05 DIAGNOSIS — Z7984 Long term (current) use of oral hypoglycemic drugs: Secondary | ICD-10-CM | POA: Diagnosis not present

## 2015-03-05 DIAGNOSIS — E7220 Disorder of urea cycle metabolism, unspecified: Secondary | ICD-10-CM | POA: Diagnosis not present

## 2015-03-05 DIAGNOSIS — K7041 Alcoholic hepatic failure with coma: Secondary | ICD-10-CM | POA: Diagnosis present

## 2015-03-05 DIAGNOSIS — R000 Tachycardia, unspecified: Secondary | ICD-10-CM | POA: Diagnosis present

## 2015-03-05 DIAGNOSIS — R569 Unspecified convulsions: Secondary | ICD-10-CM | POA: Diagnosis not present

## 2015-03-05 DIAGNOSIS — K767 Hepatorenal syndrome: Secondary | ICD-10-CM | POA: Diagnosis not present

## 2015-03-05 DIAGNOSIS — L8990 Pressure ulcer of unspecified site, unspecified stage: Secondary | ICD-10-CM | POA: Insufficient documentation

## 2015-03-05 DIAGNOSIS — R402222 Coma scale, best verbal response, incomprehensible words, at arrival to emergency department: Secondary | ICD-10-CM | POA: Diagnosis present

## 2015-03-05 DIAGNOSIS — I9589 Other hypotension: Secondary | ICD-10-CM

## 2015-03-05 DIAGNOSIS — E119 Type 2 diabetes mellitus without complications: Secondary | ICD-10-CM

## 2015-03-05 DIAGNOSIS — F1994 Other psychoactive substance use, unspecified with psychoactive substance-induced mood disorder: Secondary | ICD-10-CM | POA: Diagnosis not present

## 2015-03-05 DIAGNOSIS — Z4659 Encounter for fitting and adjustment of other gastrointestinal appliance and device: Secondary | ICD-10-CM

## 2015-03-05 DIAGNOSIS — G40909 Epilepsy, unspecified, not intractable, without status epilepticus: Secondary | ICD-10-CM | POA: Diagnosis present

## 2015-03-05 DIAGNOSIS — G4733 Obstructive sleep apnea (adult) (pediatric): Secondary | ICD-10-CM | POA: Diagnosis present

## 2015-03-05 DIAGNOSIS — J9601 Acute respiratory failure with hypoxia: Secondary | ICD-10-CM | POA: Diagnosis not present

## 2015-03-05 DIAGNOSIS — D689 Coagulation defect, unspecified: Secondary | ICD-10-CM | POA: Diagnosis not present

## 2015-03-05 DIAGNOSIS — I959 Hypotension, unspecified: Secondary | ICD-10-CM | POA: Diagnosis present

## 2015-03-05 DIAGNOSIS — R579 Shock, unspecified: Secondary | ICD-10-CM

## 2015-03-05 DIAGNOSIS — R0600 Dyspnea, unspecified: Secondary | ICD-10-CM | POA: Diagnosis not present

## 2015-03-05 DIAGNOSIS — E8770 Fluid overload, unspecified: Secondary | ICD-10-CM | POA: Diagnosis not present

## 2015-03-05 DIAGNOSIS — R5600 Simple febrile convulsions: Secondary | ICD-10-CM | POA: Diagnosis not present

## 2015-03-05 DIAGNOSIS — R140 Abdominal distension (gaseous): Secondary | ICD-10-CM | POA: Diagnosis present

## 2015-03-05 DIAGNOSIS — R45851 Suicidal ideations: Secondary | ICD-10-CM

## 2015-03-05 DIAGNOSIS — Z66 Do not resuscitate: Secondary | ICD-10-CM | POA: Diagnosis not present

## 2015-03-05 DIAGNOSIS — N179 Acute kidney failure, unspecified: Secondary | ICD-10-CM | POA: Diagnosis not present

## 2015-03-05 DIAGNOSIS — K661 Hemoperitoneum: Secondary | ICD-10-CM | POA: Diagnosis present

## 2015-03-05 DIAGNOSIS — E875 Hyperkalemia: Secondary | ICD-10-CM | POA: Diagnosis not present

## 2015-03-05 DIAGNOSIS — S36899A Unspecified injury of other intra-abdominal organs, initial encounter: Secondary | ICD-10-CM | POA: Diagnosis present

## 2015-03-05 DIAGNOSIS — K7460 Unspecified cirrhosis of liver: Secondary | ICD-10-CM | POA: Diagnosis present

## 2015-03-05 DIAGNOSIS — Z0189 Encounter for other specified special examinations: Secondary | ICD-10-CM

## 2015-03-05 DIAGNOSIS — R55 Syncope and collapse: Secondary | ICD-10-CM | POA: Diagnosis present

## 2015-03-05 DIAGNOSIS — S299XXA Unspecified injury of thorax, initial encounter: Secondary | ICD-10-CM | POA: Insufficient documentation

## 2015-03-05 DIAGNOSIS — G839 Paralytic syndrome, unspecified: Secondary | ICD-10-CM

## 2015-03-05 HISTORY — PX: LAPAROTOMY: SHX154

## 2015-03-05 LAB — URINALYSIS W MICROSCOPIC (NOT AT ARMC)
Bacteria, UA: NONE SEEN
Bilirubin Urine: NEGATIVE
GLUCOSE, UA: NEGATIVE mg/dL
KETONES UR: NEGATIVE mg/dL
LEUKOCYTES UA: NEGATIVE
Nitrite: NEGATIVE
PROTEIN: NEGATIVE mg/dL
Specific Gravity, Urine: 1.022 (ref 1.005–1.030)
pH: 7 (ref 5.0–8.0)

## 2015-03-05 LAB — DIC (DISSEMINATED INTRAVASCULAR COAGULATION)PANEL
Fibrinogen: 152 mg/dL — ABNORMAL LOW (ref 204–475)
INR: 1.79 — ABNORMAL HIGH (ref 0.00–1.49)
Platelets: 61 10*3/uL — ABNORMAL LOW (ref 150–400)
Smear Review: NONE SEEN
aPTT: 40 seconds — ABNORMAL HIGH (ref 24–37)

## 2015-03-05 LAB — CBC
HCT: 31.5 % — ABNORMAL LOW (ref 39.0–52.0)
HEMATOCRIT: 39.7 % (ref 39.0–52.0)
Hemoglobin: 13.4 g/dL (ref 13.0–17.0)
Hemoglobin: 10.4 g/dL — ABNORMAL LOW (ref 13.0–17.0)
MCH: 35 pg — ABNORMAL HIGH (ref 26.0–34.0)
MCH: 32.5 pg (ref 26.0–34.0)
MCHC: 33.8 g/dL (ref 30.0–36.0)
MCHC: 33 g/dL (ref 30.0–36.0)
MCV: 103.7 fL — ABNORMAL HIGH (ref 78.0–100.0)
MCV: 98.4 fL (ref 78.0–100.0)
PLATELETS: 121 10*3/uL — AB (ref 150–400)
PLATELETS: 61 10*3/uL — AB (ref 150–400)
RBC: 3.83 MIL/uL — ABNORMAL LOW (ref 4.22–5.81)
RBC: 3.2 MIL/uL — AB (ref 4.22–5.81)
RDW: 13.6 % (ref 11.5–15.5)
RDW: 17.4 % — ABNORMAL HIGH (ref 11.5–15.5)
WBC: 10.1 10*3/uL (ref 4.0–10.5)
WBC: 6.8 10*3/uL (ref 4.0–10.5)

## 2015-03-05 LAB — POCT I-STAT 3, ART BLOOD GAS (G3+)
Acid-base deficit: 6 mmol/L — ABNORMAL HIGH (ref 0.0–2.0)
BICARBONATE: 20.3 meq/L (ref 20.0–24.0)
O2 SAT: 91 %
PCO2 ART: 45.2 mmHg — AB (ref 35.0–45.0)
Patient temperature: 98.6
TCO2: 22 mmol/L (ref 0–100)
pH, Arterial: 7.261 — ABNORMAL LOW (ref 7.350–7.450)
pO2, Arterial: 69 mmHg — ABNORMAL LOW (ref 80.0–100.0)

## 2015-03-05 LAB — COMPREHENSIVE METABOLIC PANEL
ALBUMIN: 2.5 g/dL — AB (ref 3.5–5.0)
ALK PHOS: 129 U/L — AB (ref 38–126)
ALT: 94 U/L — AB (ref 17–63)
AST: 170 U/L — AB (ref 15–41)
Anion gap: 15 (ref 5–15)
BILIRUBIN TOTAL: 1.6 mg/dL — AB (ref 0.3–1.2)
BUN: 7 mg/dL (ref 6–20)
CALCIUM: 8.1 mg/dL — AB (ref 8.9–10.3)
CO2: 19 mmol/L — ABNORMAL LOW (ref 22–32)
CREATININE: 1.14 mg/dL (ref 0.61–1.24)
Chloride: 109 mmol/L (ref 101–111)
GFR calc Af Amer: >60 mL/min (ref >60)
GLUCOSE: 175 mg/dL — AB (ref 65–99)
POTASSIUM: 4 mmol/L (ref 3.5–5.1)
Sodium: 143 mmol/L (ref 135–145)
TOTAL PROTEIN: 5.9 g/dL — AB (ref 6.5–8.1)

## 2015-03-05 LAB — DIC (DISSEMINATED INTRAVASCULAR COAGULATION) PANEL
D DIMER QUANT: 1.48 ug{FEU}/mL — AB (ref 0.00–0.50)
PROTHROMBIN TIME: 20.7 s — AB (ref 11.6–15.2)

## 2015-03-05 LAB — CDS SEROLOGY

## 2015-03-05 LAB — PREPARE RBC (CROSSMATCH)

## 2015-03-05 LAB — ETHANOL: ALCOHOL ETHYL (B): 416 mg/dL — AB (ref <5)

## 2015-03-05 LAB — RAPID URINE DRUG SCREEN, HOSP PERFORMED
Amphetamines: NOT DETECTED
Barbiturates: NOT DETECTED
Benzodiazepines: NOT DETECTED
Cocaine: NOT DETECTED
Opiates: NOT DETECTED
Tetrahydrocannabinol: NOT DETECTED

## 2015-03-05 LAB — CBG MONITORING, ED: Glucose-Capillary: 139 mg/dL — ABNORMAL HIGH (ref 65–99)

## 2015-03-05 SURGERY — LAPAROTOMY, EXPLORATORY
Anesthesia: General | Site: Abdomen | Laterality: Bilateral

## 2015-03-05 MED ORDER — PROPOFOL 10 MG/ML IV BOLUS
INTRAVENOUS | Status: DC | PRN
  Administered 2015-03-05: 90 mg via INTRAVENOUS

## 2015-03-05 MED ORDER — ROCURONIUM BROMIDE 100 MG/10ML IV SOLN
INTRAVENOUS | Status: DC | PRN
  Administered 2015-03-05: 30 mg via INTRAVENOUS
  Administered 2015-03-05: 50 mg via INTRAVENOUS

## 2015-03-05 MED ORDER — SODIUM CHLORIDE 0.9 % IV SOLN
10.0000 mL/h | Freq: Once | INTRAVENOUS | Status: AC
  Administered 2015-03-05 (×2): via INTRAVENOUS

## 2015-03-05 MED ORDER — POTASSIUM CHLORIDE IN NACL 20-0.9 MEQ/L-% IV SOLN
INTRAVENOUS | Status: DC
  Administered 2015-03-05: 125 mL/h via INTRAVENOUS
  Administered 2015-03-06: 125 mL via INTRAVENOUS
  Administered 2015-03-06: 22:00:00 via INTRAVENOUS
  Administered 2015-03-07: 125 mL/h via INTRAVENOUS
  Administered 2015-03-07 – 2015-03-08 (×3): via INTRAVENOUS
  Filled 2015-03-05 (×9): qty 1000

## 2015-03-05 MED ORDER — SUGAMMADEX SODIUM 500 MG/5ML IV SOLN
INTRAVENOUS | Status: AC
  Filled 2015-03-05: qty 5

## 2015-03-05 MED ORDER — SODIUM CHLORIDE 0.9 % IJ SOLN
INTRAMUSCULAR | Status: AC
  Filled 2015-03-05: qty 10

## 2015-03-05 MED ORDER — SODIUM CHLORIDE 0.9 % IV SOLN
Freq: Once | INTRAVENOUS | Status: AC
  Administered 2015-03-05: 20:00:00 via INTRAVENOUS

## 2015-03-05 MED ORDER — DIPHENHYDRAMINE HCL 50 MG/ML IJ SOLN: 12.5000 mg | Freq: Four times a day (QID) | INTRAMUSCULAR | Status: DC | PRN

## 2015-03-05 MED ORDER — LIDOCAINE HCL (CARDIAC) 20 MG/ML IV SOLN
INTRAVENOUS | Status: DC | PRN
  Administered 2015-03-05: 50 mg via INTRATRACHEAL

## 2015-03-05 MED ORDER — FENTANYL CITRATE (PF) 100 MCG/2ML IJ SOLN
50.0000 ug | Freq: Once | INTRAMUSCULAR | Status: DC
  Filled 2015-03-05: qty 2

## 2015-03-05 MED ORDER — SODIUM CHLORIDE 0.9% FLUSH: 9.0000 mL | INTRAVENOUS | Status: DC | PRN

## 2015-03-05 MED ORDER — VITAMIN B-1 100 MG PO TABS
100.0000 mg | ORAL_TABLET | Freq: Every day | ORAL | Status: DC
  Administered 2015-03-06: 100 mg via ORAL
  Filled 2015-03-05: qty 1

## 2015-03-05 MED ORDER — SODIUM CHLORIDE 0.9 % IV SOLN
25.0000 ug/h | INTRAVENOUS | Status: DC
  Administered 2015-03-06: 150 ug/h via INTRAVENOUS
  Administered 2015-03-06: 50 ug/h via INTRAVENOUS
  Administered 2015-03-07: 200 ug/h via INTRAVENOUS
  Administered 2015-03-09 (×2): 150 ug/h via INTRAVENOUS
  Filled 2015-03-05 (×7): qty 50

## 2015-03-05 MED ORDER — ONDANSETRON HCL 4 MG PO TABS: 4.0000 mg | ORAL_TABLET | Freq: Four times a day (QID) | ORAL | Status: DC | PRN

## 2015-03-05 MED ORDER — FENTANYL CITRATE (PF) 100 MCG/2ML IJ SOLN
INTRAMUSCULAR | Status: DC | PRN
  Administered 2015-03-05 (×3): 50 ug via INTRAVENOUS
  Administered 2015-03-05: 100 ug via INTRAVENOUS

## 2015-03-05 MED ORDER — FENTANYL BOLUS VIA INFUSION
50.0000 ug | INTRAVENOUS | Status: DC | PRN
  Filled 2015-03-05: qty 50

## 2015-03-05 MED ORDER — ADULT MULTIVITAMIN W/MINERALS CH
1.0000 | ORAL_TABLET | Freq: Every day | ORAL | Status: DC
  Administered 2015-03-06 – 2015-04-06 (×31): 1 via ORAL
  Filled 2015-03-05 (×32): qty 1

## 2015-03-05 MED ORDER — SUCCINYLCHOLINE CHLORIDE 20 MG/ML IJ SOLN
INTRAMUSCULAR | Status: DC | PRN
  Administered 2015-03-05: 120 mg via INTRAVENOUS

## 2015-03-05 MED ORDER — FENTANYL CITRATE (PF) 250 MCG/5ML IJ SOLN
INTRAMUSCULAR | Status: AC
  Filled 2015-03-05: qty 5

## 2015-03-05 MED ORDER — SODIUM BICARBONATE 8.4 % IV SOLN
INTRAVENOUS | Status: AC
  Administered 2015-03-06: 50 meq via INTRAVENOUS
  Filled 2015-03-05: qty 50

## 2015-03-05 MED ORDER — IOHEXOL 300 MG/ML  SOLN
100.0000 mL | Freq: Once | INTRAMUSCULAR | Status: AC | PRN
  Administered 2015-03-05: 100 mL via INTRAVENOUS

## 2015-03-05 MED ORDER — DIPHENHYDRAMINE HCL 12.5 MG/5ML PO ELIX: 12.5000 mg | ORAL_SOLUTION | Freq: Four times a day (QID) | ORAL | Status: DC | PRN

## 2015-03-05 MED ORDER — SODIUM BICARBONATE 8.4 % IV SOLN
INTRAVENOUS | Status: DC | PRN
  Administered 2015-03-05: 50 meq via INTRAVENOUS

## 2015-03-05 MED ORDER — PROPOFOL 10 MG/ML IV BOLUS
INTRAVENOUS | Status: AC
  Filled 2015-03-05: qty 20

## 2015-03-05 MED ORDER — DEXTROSE 5 % IV SOLN
2.0000 g | INTRAVENOUS | Status: DC | PRN
  Administered 2015-03-05: 2 g via INTRAVENOUS

## 2015-03-05 MED ORDER — CEFOTETAN DISODIUM-DEXTROSE 2-2.08 GM-% IV SOLR
INTRAVENOUS | Status: AC
  Filled 2015-03-05: qty 50

## 2015-03-05 MED ORDER — DEXMEDETOMIDINE HCL IN NACL 200 MCG/50ML IV SOLN
0.0000 ug/kg/h | INTRAVENOUS | Status: DC
  Administered 2015-03-06: 0.5 ug/kg/h via INTRAVENOUS
  Administered 2015-03-06: 0.4 ug/kg/h via INTRAVENOUS
  Administered 2015-03-06: 1 ug/kg/h via INTRAVENOUS
  Filled 2015-03-05: qty 100
  Filled 2015-03-05: qty 50

## 2015-03-05 MED ORDER — BUPROPION HCL 100 MG PO TABS
100.0000 mg | ORAL_TABLET | Freq: Two times a day (BID) | ORAL | Status: DC
  Administered 2015-03-06 – 2015-03-07 (×3): 100 mg via ORAL
  Filled 2015-03-05 (×6): qty 1

## 2015-03-05 MED ORDER — ESCITALOPRAM OXALATE 20 MG PO TABS
20.0000 mg | ORAL_TABLET | Freq: Every day | ORAL | Status: DC
  Administered 2015-03-06: 20 mg via ORAL
  Filled 2015-03-05 (×2): qty 1

## 2015-03-05 MED ORDER — LORAZEPAM 2 MG/ML IJ SOLN
1.0000 mg | INTRAMUSCULAR | Status: DC | PRN
  Administered 2015-03-05 – 2015-03-10 (×11): 2 mg via INTRAVENOUS
  Filled 2015-03-05 (×13): qty 1

## 2015-03-05 MED ORDER — NALOXONE HCL 0.4 MG/ML IJ SOLN: 0.4000 mg | INTRAMUSCULAR | Status: DC | PRN

## 2015-03-05 MED ORDER — OXYCODONE HCL 5 MG PO TABS: 10.0000 mg | ORAL_TABLET | ORAL | Status: DC | PRN

## 2015-03-05 MED ORDER — PROPOFOL 500 MG/50ML IV EMUL
INTRAVENOUS | Status: DC | PRN
  Administered 2015-03-05: 30 ug/kg/min via INTRAVENOUS

## 2015-03-05 MED ORDER — FOLIC ACID 1 MG PO TABS
1.0000 mg | ORAL_TABLET | Freq: Every day | ORAL | Status: DC
  Administered 2015-03-06: 1 mg via ORAL
  Filled 2015-03-05: qty 1

## 2015-03-05 MED ORDER — SODIUM CHLORIDE 0.9 % IV SOLN: 10.0000 mL/h | Freq: Once | INTRAVENOUS | Status: DC

## 2015-03-05 MED ORDER — EZETIMIBE 10 MG PO TABS
10.0000 mg | ORAL_TABLET | Freq: Every day | ORAL | Status: DC
  Administered 2015-03-06: 10 mg via ORAL
  Filled 2015-03-05: qty 1

## 2015-03-05 MED ORDER — POVIDONE-IODINE 10 % EX OINT
TOPICAL_OINTMENT | CUTANEOUS | Status: AC
  Filled 2015-03-05: qty 28.35

## 2015-03-05 MED ORDER — SUCCINYLCHOLINE CHLORIDE 20 MG/ML IJ SOLN
INTRAMUSCULAR | Status: AC
  Filled 2015-03-05: qty 1

## 2015-03-05 MED ORDER — ROCURONIUM BROMIDE 50 MG/5ML IV SOLN
INTRAVENOUS | Status: AC
  Filled 2015-03-05: qty 1

## 2015-03-05 MED ORDER — EPHEDRINE SULFATE 50 MG/ML IJ SOLN
INTRAMUSCULAR | Status: AC
  Filled 2015-03-05: qty 1

## 2015-03-05 MED ORDER — ONDANSETRON HCL 4 MG/2ML IJ SOLN: 4.0000 mg | Freq: Four times a day (QID) | INTRAMUSCULAR | Status: DC | PRN

## 2015-03-05 MED ORDER — PHENOL 1.4 % MT LIQD: 1.0000 | OROMUCOSAL | Status: DC | PRN

## 2015-03-05 MED ORDER — PANTOPRAZOLE SODIUM 40 MG PO TBEC: 40.0000 mg | DELAYED_RELEASE_TABLET | Freq: Every day | ORAL | Status: DC

## 2015-03-05 MED ORDER — HYDROMORPHONE 1 MG/ML IV SOLN: INTRAVENOUS | Status: DC

## 2015-03-05 MED ORDER — PANTOPRAZOLE SODIUM 40 MG IV SOLR
40.0000 mg | Freq: Every day | INTRAVENOUS | Status: DC
  Administered 2015-03-06 – 2015-03-07 (×2): 40 mg via INTRAVENOUS
  Filled 2015-03-05 (×2): qty 40

## 2015-03-05 MED ORDER — TRANEXAMIC ACID 1000 MG/10ML IV SOLN
1000.0000 mg | INTRAVENOUS | Status: AC
  Administered 2015-03-05: 1000 mg via INTRAVENOUS
  Filled 2015-03-05: qty 10

## 2015-03-05 MED ORDER — TRANEXAMIC ACID 1000 MG/10ML IV SOLN: 500.0000 mg | Freq: Once | INTRAVENOUS | Status: DC

## 2015-03-05 SURGICAL SUPPLY — 47 items
BLADE SURG ROTATE 9660 (MISCELLANEOUS) IMPLANT
BRR ADH 5X3 SEPRAFILM 6 SHT (MISCELLANEOUS)
CANISTER SUCTION 2500CC (MISCELLANEOUS) ×3 IMPLANT
CHLORAPREP W/TINT 26ML (MISCELLANEOUS) ×3 IMPLANT
COVER MAYO STAND STRL (DRAPES) IMPLANT
COVER SURGICAL LIGHT HANDLE (MISCELLANEOUS) ×3 IMPLANT
DRAPE LAPAROSCOPIC ABDOMINAL (DRAPES) ×3 IMPLANT
DRAPE PROXIMA HALF (DRAPES) IMPLANT
DRAPE UTILITY XL STRL (DRAPES) ×6 IMPLANT
DRAPE WARM FLUID 44X44 (DRAPE) ×3 IMPLANT
DRSG OPSITE POSTOP 4X10 (GAUZE/BANDAGES/DRESSINGS) IMPLANT
DRSG OPSITE POSTOP 4X8 (GAUZE/BANDAGES/DRESSINGS) IMPLANT
DRSG PAD ABDOMINAL 8X10 ST (GAUZE/BANDAGES/DRESSINGS) ×2 IMPLANT
ELECT BLADE 6.5 EXT (BLADE) IMPLANT
ELECT CAUTERY BLADE 6.4 (BLADE) ×6 IMPLANT
ELECT REM PT RETURN 9FT ADLT (ELECTROSURGICAL) ×3
ELECTRODE REM PT RTRN 9FT ADLT (ELECTROSURGICAL) ×1 IMPLANT
GLOVE BIOGEL PI IND STRL 8 (GLOVE) ×1 IMPLANT
GLOVE BIOGEL PI INDICATOR 8 (GLOVE) ×2
GLOVE ECLIPSE 7.5 STRL STRAW (GLOVE) ×3 IMPLANT
GOWN STRL REUS W/ TWL LRG LVL3 (GOWN DISPOSABLE) ×3 IMPLANT
GOWN STRL REUS W/TWL LRG LVL3 (GOWN DISPOSABLE) ×9
KIT BASIN OR (CUSTOM PROCEDURE TRAY) ×3 IMPLANT
KIT ROOM TURNOVER OR (KITS) ×3 IMPLANT
LIGASURE IMPACT 36 18CM CVD LR (INSTRUMENTS) IMPLANT
NS IRRIG 1000ML POUR BTL (IV SOLUTION) ×6 IMPLANT
PACK GENERAL/GYN (CUSTOM PROCEDURE TRAY) ×3 IMPLANT
PAD ARMBOARD 7.5X6 YLW CONV (MISCELLANEOUS) ×3 IMPLANT
PENCIL BUTTON HOLSTER BLD 10FT (ELECTRODE) IMPLANT
SEPRAFILM PROCEDURAL PACK 3X5 (MISCELLANEOUS) IMPLANT
SPECIMEN JAR LARGE (MISCELLANEOUS) IMPLANT
SPONGE GAUZE 4X4 12PLY STER LF (GAUZE/BANDAGES/DRESSINGS) ×2 IMPLANT
SPONGE LAP 18X18 X RAY DECT (DISPOSABLE) IMPLANT
STAPLER VISISTAT 35W (STAPLE) ×3 IMPLANT
SUCTION POOLE TIP (SUCTIONS) ×3 IMPLANT
SUT NOVA 1 T20/GS 25DT (SUTURE) IMPLANT
SUT PDS AB 1 TP1 96 (SUTURE) ×6 IMPLANT
SUT SILK 2 0 SH CR/8 (SUTURE) ×3 IMPLANT
SUT SILK 2 0 TIES 10X30 (SUTURE) ×3 IMPLANT
SUT SILK 3 0 SH CR/8 (SUTURE) ×3 IMPLANT
SUT SILK 3 0 TIES 10X30 (SUTURE) ×3 IMPLANT
TAPE CLOTH SURG 6X10 WHT LF (GAUZE/BANDAGES/DRESSINGS) ×2 IMPLANT
TOWEL OR 17X26 10 PK STRL BLUE (TOWEL DISPOSABLE) ×3 IMPLANT
TRAY FOLEY CATH 16FRSI W/METER (SET/KITS/TRAYS/PACK) IMPLANT
TUBE CONNECTING 12'X1/4 (SUCTIONS)
TUBE CONNECTING 12X1/4 (SUCTIONS) IMPLANT
YANKAUER SUCT BULB TIP NO VENT (SUCTIONS) IMPLANT

## 2015-03-05 NOTE — Progress Notes (Signed)
Rt called to upgraded level 2 trauma.  Rt assessment found pulse oz reading at 90% on RA.  Rt placed 4lpm [place] on pt and sats rose to 94-96%.  RT will continue to monitor.
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
## 2015-03-05 NOTE — ED Notes (Signed)
Positive fast exam, pt to go to OR
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
## 2015-03-05 NOTE — Anesthesia Procedure Notes (Signed)
Procedure Name: Intubation Date/Time: 03/01/2015 9:13 PM Performed by: Brien Mates D Pre-anesthesia Checklist: Patient identified, Emergency Drugs available, Suction available, Patient being monitored and Timeout performed Patient Re-evaluated:Patient Re-evaluated prior to inductionOxygen Delivery Method: Circle system utilized Preoxygenation: Pre-oxygenation with 100% oxygen (cneck maintained neutral position with cervical collar  in place) Intubation Type: IV induction, Rapid sequence and Cricoid Pressure applied Laryngoscope Size: Glidescope (elective glide) Grade View: Grade I Tube type: Subglottic suction tube Tube size: 7.5 mm Number of attempts: 1 Airway Equipment and Method: Stylet and Video-laryngoscopy Placement Confirmation: ETT inserted through vocal cords under direct vision,  positive ETCO2 and breath sounds checked- equal and bilateral Secured at: 23 cm Tube secured with: Tape Dental Injury: Teeth and Oropharynx as per pre-operative assessment

## 2015-03-05 NOTE — Anesthesia Preprocedure Evaluation (Addendum)
Anesthesia Evaluation  Patient identified by MRN, date of birth, ID band  Reviewed: Unable to perform ROS - Chart review onlyPreop documentation limited or incomplete due to emergent nature of procedure.  Airway Mallampati: III   Neck ROM: Limited  Mouth opening: Limited Mouth Opening Comment: C Collar in position Dental  (+) Teeth Intact   Pulmonary sleep apnea and Continuous Positive Airway Pressure Ventilation , former smoker,    breath sounds clear to auscultation       Cardiovascular negative cardio ROS   Rhythm:Regular Rate:Tachycardia     Neuro/Psych PSYCHIATRIC DISORDERS    GI/Hepatic (+)     substance abuse  alcohol use, Hepatitis -, Toxin Related  Endo/Other  diabetes  Renal/GU negative Renal ROS  negative genitourinary   Musculoskeletal negative musculoskeletal ROS (+)   Abdominal (+) + obese,  Abdomen: tender.    Peds negative pediatric ROS (+)  Hematology negative hematology ROS (+)   Anesthesia Other Findings - HLD   Reproductive/Obstetrics negative OB ROS                            Lab Results  Component Value Date   WBC 4.6 10/13/2014   HGB 15.5 10/13/2014   HCT 45.1 10/13/2014   MCV 100.0 10/13/2014   PLT 97.0* 10/13/2014   Lab Results  Component Value Date   CREATININE 0.66 10/13/2014   BUN 10 10/13/2014   NA 137 10/13/2014   K 3.9 10/13/2014   CL 101 10/13/2014   CO2 27 10/13/2014   Lab Results  Component Value Date   INR 1.09 05/15/2013   INR 1.0 04/12/2011   March 18, 2015 EKG: sinus tachycardia, PVC's.    Anesthesia Physical Anesthesia Plan  ASA: IV and emergent  Anesthesia Plan: General   Post-op Pain Management:    Induction: Intravenous, Cricoid pressure planned and Rapid sequence  Airway Management Planned: Oral ETT  Additional Equipment: Arterial line  Intra-op Plan:   Post-operative Plan: Possible Post-op  intubation/ventilation  Informed Consent: I have reviewed the patients History and Physical, chart, labs and discussed the procedure including the risks, benefits and alternatives for the proposed anesthesia with the patient or authorized representative who has indicated his/her understanding and acceptance.   Dental advisory given, History available from chart only and Only emergency history available  Plan Discussed with: CRNA  Anesthesia Plan Comments: (C spine stabilization maintained throughout intubation. )       Anesthesia Quick Evaluation

## 2015-03-05 NOTE — ED Notes (Signed)
Pts friend Ayesha Mohair brought pts meds to nurse first (pt on way to OR) to make sure we had meds on file for pt, but pt is not taking.

## 2015-03-05 NOTE — Transfer of Care (Signed)
Immediate Anesthesia Transfer of Care Note  Patient: Ricky Molina.  Procedure(s) Performed: Procedure(s): EXPLORATORY LAPAROTOMY (Bilateral)  Patient Location: PACU  Anesthesia Type:General  Level of Consciousness: sedated  Airway & Oxygen Therapy: Patient remains intubated per anesthesia plan and Patient placed on Ventilator (see vital sign flow sheet for setting)  Post-op Assessment: Report given to RN and Post -op Vital signs reviewed and stable  Post vital signs: Reviewed and stable  Last Vitals:  Filed Vitals:   03/20/15 1945 2015-03-20 2000  BP: 93/57 117/47  Pulse:  111  Temp:    Resp:  18    Complications: No apparent anesthesia complications
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
## 2015-03-05 NOTE — Anesthesia Postprocedure Evaluation (Signed)
Anesthesia Post Note  Patient: Ricky Molina.  Procedure(s) Performed: Procedure(s) (LRB): EXPLORATORY LAPAROTOMY (Bilateral)  Patient location during evaluation: ICU Anesthesia Type: General Level of consciousness: patient remains intubated per anesthesia plan Vital Signs Assessment: post-procedure vital signs reviewed and stable Respiratory status: patient remains intubated per anesthesia plan Cardiovascular status: stable Postop Assessment: no signs of nausea or vomiting Anesthetic complications: no    Last Vitals:  Filed Vitals:   01-Apr-2015 2000 01-Apr-2015 2210  BP: 117/47 127/78  Pulse: 111 123  Temp:    Resp: 18 16    Last Pain:  Filed Vitals:   Apr 01, 2015 2245  PainSc: 0-No pain                 Shelton Silvas

## 2015-03-06 LAB — GLUCOSE, CAPILLARY
GLUCOSE-CAPILLARY: 187 mg/dL — AB (ref 65–99)
GLUCOSE-CAPILLARY: 222 mg/dL — AB (ref 65–99)
GLUCOSE-CAPILLARY: 198 mg/dL — AB (ref 65–99)
GLUCOSE-CAPILLARY: 178 mg/dL — AB (ref 65–99)
Glucose-Capillary: 199 mg/dL — ABNORMAL HIGH (ref 65–99)
Glucose-Capillary: 221 mg/dL — ABNORMAL HIGH (ref 65–99)

## 2015-03-06 LAB — COMPREHENSIVE METABOLIC PANEL
ALBUMIN: 2.4 g/dL — AB (ref 3.5–5.0)
ALK PHOS: 97 U/L (ref 38–126)
ALT: 215 U/L — ABNORMAL HIGH (ref 17–63)
ALT: 148 U/L — AB (ref 17–63)
ANION GAP: 12 (ref 5–15)
AST: 411 U/L — ABNORMAL HIGH (ref 15–41)
AST: 275 U/L — AB (ref 15–41)
Albumin: 2.4 g/dL — ABNORMAL LOW (ref 3.5–5.0)
Alkaline Phosphatase: 81 U/L (ref 38–126)
Anion gap: 13 (ref 5–15)
BUN: 6 mg/dL (ref 6–20)
BUN: 7 mg/dL (ref 6–20)
CALCIUM: 7 mg/dL — AB (ref 8.9–10.3)
CHLORIDE: 108 mmol/L (ref 101–111)
CHLORIDE: 109 mmol/L (ref 101–111)
CO2: 22 mmol/L (ref 22–32)
CO2: 25 mmol/L (ref 22–32)
CREATININE: 0.82 mg/dL (ref 0.61–1.24)
Calcium: 6.9 mg/dL — ABNORMAL LOW (ref 8.9–10.3)
Creatinine, Ser: 0.7 mg/dL (ref 0.61–1.24)
GFR calc Af Amer: >60 mL/min (ref >60)
GFR calc non Af Amer: >60 mL/min (ref >60)
GFR calc non Af Amer: >60 mL/min (ref >60)
GLUCOSE: 216 mg/dL — AB (ref 65–99)
Glucose, Bld: 233 mg/dL — ABNORMAL HIGH (ref 65–99)
POTASSIUM: 4.4 mmol/L (ref 3.5–5.1)
Potassium: 4.3 mmol/L (ref 3.5–5.1)
SODIUM: 143 mmol/L (ref 135–145)
SODIUM: 146 mmol/L — AB (ref 135–145)
Total Bilirubin: 2.4 mg/dL — ABNORMAL HIGH (ref 0.3–1.2)
Total Bilirubin: 3.5 mg/dL — ABNORMAL HIGH (ref 0.3–1.2)
Total Protein: 4.8 g/dL — ABNORMAL LOW (ref 6.5–8.1)
Total Protein: 4.9 g/dL — ABNORMAL LOW (ref 6.5–8.1)

## 2015-03-06 LAB — CBC
HCT: 26.5 % — ABNORMAL LOW (ref 39.0–52.0)
HEMATOCRIT: 26.4 % — AB (ref 39.0–52.0)
HEMATOCRIT: 34.6 % — AB (ref 39.0–52.0)
HEMOGLOBIN: 11.4 g/dL — AB (ref 13.0–17.0)
HEMOGLOBIN: 9 g/dL — AB (ref 13.0–17.0)
Hemoglobin: 8.8 g/dL — ABNORMAL LOW (ref 13.0–17.0)
MCH: 31.8 pg (ref 26.0–34.0)
MCH: 31.9 pg (ref 26.0–34.0)
MCH: 32.6 pg (ref 26.0–34.0)
MCHC: 32.9 g/dL (ref 30.0–36.0)
MCHC: 33.2 g/dL (ref 30.0–36.0)
MCHC: 34.1 g/dL (ref 30.0–36.0)
MCV: 95.7 fL (ref 78.0–100.0)
MCV: 96 fL (ref 78.0–100.0)
MCV: 96.6 fL (ref 78.0–100.0)
PLATELETS: 51 10*3/uL — AB (ref 150–400)
PLATELETS: 45 10*3/uL — AB (ref 150–400)
Platelets: 81 10*3/uL — ABNORMAL LOW (ref 150–400)
RBC: 2.76 MIL/uL — AB (ref 4.22–5.81)
RBC: 2.76 MIL/uL — AB (ref 4.22–5.81)
RBC: 3.58 MIL/uL — ABNORMAL LOW (ref 4.22–5.81)
RDW: 18.3 % — ABNORMAL HIGH (ref 11.5–15.5)
RDW: 19.6 % — AB (ref 11.5–15.5)
RDW: 19.7 % — ABNORMAL HIGH (ref 11.5–15.5)
WBC: 5.9 10*3/uL (ref 4.0–10.5)
WBC: 11.5 10*3/uL — AB (ref 4.0–10.5)
WBC: 8 10*3/uL (ref 4.0–10.5)

## 2015-03-06 LAB — PREPARE FRESH FROZEN PLASMA
Unit division: 0
Unit division: 0

## 2015-03-06 LAB — ABO/RH: ABO/RH(D): A POS

## 2015-03-06 LAB — PROTIME-INR
INR: 1.55 — AB (ref 0.00–1.49)
INR: 1.57 — AB (ref 0.00–1.49)
INR: 1.61 — ABNORMAL HIGH (ref 0.00–1.49)
PROTHROMBIN TIME: 18.6 s — AB (ref 11.6–15.2)
Prothrombin Time: 18.8 seconds — ABNORMAL HIGH (ref 11.6–15.2)
Prothrombin Time: 19.2 seconds — ABNORMAL HIGH (ref 11.6–15.2)

## 2015-03-06 LAB — PREPARE RBC (CROSSMATCH)

## 2015-03-06 LAB — BLOOD PRODUCT ORDER (VERBAL) VERIFICATION

## 2015-03-06 LAB — MRSA PCR SCREENING: MRSA BY PCR: NEGATIVE

## 2015-03-06 MED ORDER — NOREPINEPHRINE BITARTRATE 1 MG/ML IV SOLN
0.0000 ug/min | INTRAVENOUS | Status: DC
  Administered 2015-03-06: 5 ug/min via INTRAVENOUS
  Administered 2015-03-06: 2 ug/min via INTRAVENOUS
  Administered 2015-03-07: 3 ug/min via INTRAVENOUS
  Administered 2015-03-08 (×2): 5 ug/min via INTRAVENOUS
  Filled 2015-03-06 (×7): qty 4

## 2015-03-06 MED ORDER — SODIUM CHLORIDE 0.9 % IV SOLN
Freq: Once | INTRAVENOUS | Status: AC
  Administered 2015-03-06: 10 mL/h via INTRAVENOUS

## 2015-03-06 MED ORDER — DEXTROSE 5 % IV SOLN
5.0000 mg | Freq: Once | INTRAVENOUS | Status: AC
  Administered 2015-03-06: 5 mg via INTRAVENOUS
  Filled 2015-03-06: qty 0.5

## 2015-03-06 MED ORDER — CHLORHEXIDINE GLUCONATE 0.12% ORAL RINSE (MEDLINE KIT)
15.0000 mL | Freq: Two times a day (BID) | OROMUCOSAL | Status: DC
  Administered 2015-03-06 – 2015-03-13 (×15): 15 mL via OROMUCOSAL

## 2015-03-06 MED ORDER — SODIUM BICARBONATE 8.4 % IV SOLN
50.0000 meq | Freq: Once | INTRAVENOUS | Status: AC
  Administered 2015-03-06: 50 meq via INTRAVENOUS

## 2015-03-06 MED ORDER — ACETAMINOPHEN 160 MG/5ML PO SOLN
650.0000 mg | Freq: Two times a day (BID) | ORAL | Status: DC | PRN
  Administered 2015-03-06 – 2015-03-18 (×14): 650 mg
  Filled 2015-03-06 (×15): qty 20.3

## 2015-03-06 MED ORDER — ANTISEPTIC ORAL RINSE SOLUTION (CORINZ)
7.0000 mL | Freq: Four times a day (QID) | OROMUCOSAL | Status: DC
  Administered 2015-03-06 – 2015-03-13 (×29): 7 mL via OROMUCOSAL

## 2015-03-06 MED ORDER — INSULIN ASPART 100 UNIT/ML ~~LOC~~ SOLN
2.0000 [IU] | SUBCUTANEOUS | Status: DC
  Administered 2015-03-06: 6 [IU] via SUBCUTANEOUS
  Administered 2015-03-06 (×3): 4 [IU] via SUBCUTANEOUS
  Administered 2015-03-06: 6 [IU] via SUBCUTANEOUS
  Administered 2015-03-06 – 2015-03-07 (×3): 4 [IU] via SUBCUTANEOUS
  Administered 2015-03-07 (×2): 2 [IU] via SUBCUTANEOUS
  Administered 2015-03-07: 4 [IU] via SUBCUTANEOUS
  Administered 2015-03-08 (×3): 2 [IU] via SUBCUTANEOUS
  Administered 2015-03-08 (×2): 4 [IU] via SUBCUTANEOUS
  Administered 2015-03-09: 2 [IU] via SUBCUTANEOUS
  Administered 2015-03-09: 4 [IU] via SUBCUTANEOUS
  Administered 2015-03-09 – 2015-03-10 (×9): 2 [IU] via SUBCUTANEOUS
  Administered 2015-03-11 (×2): 4 [IU] via SUBCUTANEOUS
  Administered 2015-03-11: 2 [IU] via SUBCUTANEOUS
  Administered 2015-03-11 – 2015-03-12 (×5): 4 [IU] via SUBCUTANEOUS

## 2015-03-06 MED ORDER — ALBUMIN HUMAN 5 % IV SOLN
12.5000 g | Freq: Once | INTRAVENOUS | Status: AC
  Administered 2015-03-06: 12.5 g via INTRAVENOUS
  Filled 2015-03-06: qty 500

## 2015-03-06 MED ORDER — DEXMEDETOMIDINE HCL IN NACL 400 MCG/100ML IV SOLN
0.4000 ug/kg/h | INTRAVENOUS | Status: DC
  Administered 2015-03-06: 1 ug/kg/h via INTRAVENOUS
  Administered 2015-03-06: 1.1 ug/kg/h via INTRAVENOUS
  Administered 2015-03-06 (×4): 1 ug/kg/h via INTRAVENOUS
  Administered 2015-03-07 (×2): 1.1 ug/kg/h via INTRAVENOUS
  Administered 2015-03-07: 1 ug/kg/h via INTRAVENOUS
  Filled 2015-03-06 (×11): qty 100

## 2015-03-06 NOTE — Progress Notes (Signed)
Dr [name] notified of Temp 102, orders received for prn Tylenol
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
## 2015-03-07 ENCOUNTER — Inpatient Hospital Stay (HOSPITAL_COMMUNITY): Payer: BLUE CROSS/BLUE SHIELD

## 2015-03-07 ENCOUNTER — Encounter (HOSPITAL_COMMUNITY): Payer: Self-pay | Admitting: General Surgery

## 2015-03-07 LAB — GLUCOSE, CAPILLARY
GLUCOSE-CAPILLARY: 131 mg/dL — AB (ref 65–99)
GLUCOSE-CAPILLARY: 138 mg/dL — AB (ref 65–99)
GLUCOSE-CAPILLARY: 170 mg/dL — AB (ref 65–99)
GLUCOSE-CAPILLARY: 179 mg/dL — AB (ref 65–99)
Glucose-Capillary: 157 mg/dL — ABNORMAL HIGH (ref 65–99)
Glucose-Capillary: 151 mg/dL — ABNORMAL HIGH (ref 65–99)
Glucose-Capillary: 145 mg/dL — ABNORMAL HIGH (ref 65–99)

## 2015-03-07 LAB — BLOOD GAS, ARTERIAL
ACID-BASE EXCESS: 3.5 mmol/L — AB (ref 0.0–2.0)
Bicarbonate: 27.5 mEq/L — ABNORMAL HIGH (ref 20.0–24.0)
FIO2: 0.4
LHR: 14 {breaths}/min
O2 SAT: 94.5 %
PATIENT TEMPERATURE: 100
PCO2 ART: 43.7 mmHg (ref 35.0–45.0)
PEEP/CPAP: 5 cmH2O
PH ART: 7.42 (ref 7.350–7.450)
TCO2: 28.8 mmol/L (ref 0–100)
VT: 560 mL
pO2, Arterial: 75.6 mmHg — ABNORMAL LOW (ref 80.0–100.0)

## 2015-03-07 LAB — POCT I-STAT 7, (LYTES, BLD GAS, ICA,H+H)
ACID-BASE DEFICIT: 7 mmol/L — AB (ref 0.0–2.0)
ACID-BASE DEFICIT: 8 mmol/L — AB (ref 0.0–2.0)
BICARBONATE: 19.7 meq/L — AB (ref 20.0–24.0)
BICARBONATE: 20.9 meq/L (ref 20.0–24.0)
CALCIUM ION: 0.91 mmol/L — AB (ref 1.12–1.23)
Calcium, Ion: 0.91 mmol/L — ABNORMAL LOW (ref 1.12–1.23)
HEMATOCRIT: 28 % — AB (ref 39.0–52.0)
HEMATOCRIT: 31 % — AB (ref 39.0–52.0)
HEMOGLOBIN: 10.5 g/dL — AB (ref 13.0–17.0)
Hemoglobin: 9.5 g/dL — ABNORMAL LOW (ref 13.0–17.0)
O2 SAT: 96 %
O2 Saturation: 99 %
PCO2 ART: 52.9 mmHg — AB (ref 35.0–45.0)
PH ART: 7.218 — AB (ref 7.350–7.450)
PO2 ART: 156 mmHg — AB (ref 80.0–100.0)
POTASSIUM: 4.4 mmol/L (ref 3.5–5.1)
Potassium: 3.8 mmol/L (ref 3.5–5.1)
SODIUM: 144 mmol/L (ref 135–145)
SODIUM: 145 mmol/L (ref 135–145)
TCO2: 21 mmol/L (ref 0–100)
TCO2: 22 mmol/L (ref 0–100)
pCO2 arterial: 47.1 mmHg — ABNORMAL HIGH (ref 35.0–45.0)
pH, Arterial: 7.205 — ABNORMAL LOW (ref 7.350–7.450)
pO2, Arterial: 89 mmHg (ref 80.0–100.0)

## 2015-03-07 LAB — COMPREHENSIVE METABOLIC PANEL
ALBUMIN: 2.5 g/dL — AB (ref 3.5–5.0)
ALK PHOS: 80 U/L (ref 38–126)
ALT: 913 U/L — AB (ref 17–63)
AST: 1979 U/L — AB (ref 15–41)
Anion gap: 6 (ref 5–15)
BILIRUBIN TOTAL: 2.9 mg/dL — AB (ref 0.3–1.2)
BUN: 11 mg/dL (ref 6–20)
CALCIUM: 7 mg/dL — AB (ref 8.9–10.3)
CO2: 28 mmol/L (ref 22–32)
CREATININE: 0.73 mg/dL (ref 0.61–1.24)
Chloride: 114 mmol/L — ABNORMAL HIGH (ref 101–111)
GFR calc Af Amer: >60 mL/min (ref >60)
GLUCOSE: 201 mg/dL — AB (ref 65–99)
Potassium: 4.1 mmol/L (ref 3.5–5.1)
Sodium: 148 mmol/L — ABNORMAL HIGH (ref 135–145)
TOTAL PROTEIN: 5 g/dL — AB (ref 6.5–8.1)

## 2015-03-07 LAB — CBC
HCT: 26.6 % — ABNORMAL LOW (ref 39.0–52.0)
HCT: 23.1 % — ABNORMAL LOW (ref 39.0–52.0)
Hemoglobin: 8.8 g/dL — ABNORMAL LOW (ref 13.0–17.0)
Hemoglobin: 7.6 g/dL — ABNORMAL LOW (ref 13.0–17.0)
MCH: 32.6 pg (ref 26.0–34.0)
MCH: 32.8 pg (ref 26.0–34.0)
MCHC: 33.1 g/dL (ref 30.0–36.0)
MCHC: 32.9 g/dL (ref 30.0–36.0)
MCV: 98.5 fL (ref 78.0–100.0)
MCV: 99.6 fL (ref 78.0–100.0)
PLATELETS: 38 10*3/uL — AB (ref 150–400)
PLATELETS: 43 10*3/uL — AB (ref 150–400)
RBC: 2.7 MIL/uL — ABNORMAL LOW (ref 4.22–5.81)
RBC: 2.32 MIL/uL — ABNORMAL LOW (ref 4.22–5.81)
RDW: 19.1 % — AB (ref 11.5–15.5)
RDW: 18.6 % — AB (ref 11.5–15.5)
WBC: 6.9 10*3/uL (ref 4.0–10.5)
WBC: 5.3 10*3/uL (ref 4.0–10.5)

## 2015-03-07 LAB — POCT ACTIVATED CLOTTING TIME: ACTIVATED CLOTTING TIME: 147 s

## 2015-03-07 LAB — PREPARE FRESH FROZEN PLASMA
UNIT DIVISION: 0
Unit division: 0
Unit division: 0
Unit division: 0
Unit division: 0
Unit division: 0

## 2015-03-07 LAB — PROTIME-INR
INR: 1.58 — ABNORMAL HIGH (ref 0.00–1.49)
PROTHROMBIN TIME: 18.9 s — AB (ref 11.6–15.2)

## 2015-03-07 LAB — POCT I-STAT 3, ART BLOOD GAS (G3+)
Acid-base deficit: 1 mmol/L (ref 0.0–2.0)
Bicarbonate: 25.9 mEq/L — ABNORMAL HIGH (ref 20.0–24.0)
O2 SAT: 98 %
PCO2 ART: 57.5 mmHg — AB (ref 35.0–45.0)
PO2 ART: 129 mmHg — AB (ref 80.0–100.0)
Patient temperature: 100.5
TCO2: 28 mmol/L (ref 0–100)
pH, Arterial: 7.267 — ABNORMAL LOW (ref 7.350–7.450)

## 2015-03-07 LAB — APTT: aPTT: 35 seconds (ref 24–37)

## 2015-03-07 MED ORDER — FOLIC ACID 5 MG/ML IJ SOLN
1.0000 mg | Freq: Every day | INTRAMUSCULAR | Status: DC
  Administered 2015-03-07: 1 mg via INTRAVENOUS
  Filled 2015-03-07 (×2): qty 0.2

## 2015-03-07 MED ORDER — ALBUMIN HUMAN 5 % IV SOLN
INTRAVENOUS | Status: AC
  Filled 2015-03-07: qty 250

## 2015-03-07 MED ORDER — THIAMINE HCL 100 MG/ML IJ SOLN
100.0000 mg | Freq: Every day | INTRAMUSCULAR | Status: DC
  Administered 2015-03-07: 100 mg via INTRAVENOUS
  Filled 2015-03-07: qty 2

## 2015-03-07 MED ORDER — SODIUM CHLORIDE 0.9% FLUSH
10.0000 mL | INTRAVENOUS | Status: DC | PRN
Start: 2015-03-07 — End: 2015-03-20
  Administered 2015-03-18: 30 mL
  Filled 2015-03-07: qty 40

## 2015-03-07 MED ORDER — ALBUMIN HUMAN 5 % IV SOLN
25.0000 g | Freq: Once | INTRAVENOUS | Status: AC
  Administered 2015-03-07: 25 g via INTRAVENOUS
  Filled 2015-03-07: qty 250

## 2015-03-07 MED ORDER — IPRATROPIUM-ALBUTEROL 0.5-2.5 (3) MG/3ML IN SOLN
3.0000 mL | Freq: Four times a day (QID) | RESPIRATORY_TRACT | Status: DC
  Administered 2015-03-08 – 2015-03-10 (×10): 3 mL via RESPIRATORY_TRACT
  Filled 2015-03-07 (×10): qty 3

## 2015-03-07 MED ORDER — DEXMEDETOMIDINE HCL IN NACL 400 MCG/100ML IV SOLN
0.4000 ug/kg/h | INTRAVENOUS | Status: DC
  Administered 2015-03-07 (×3): 1.2 ug/kg/h via INTRAVENOUS
  Administered 2015-03-07: 1.1 ug/kg/h via INTRAVENOUS
  Administered 2015-03-07 – 2015-03-08 (×3): 1.2 ug/kg/h via INTRAVENOUS
  Administered 2015-03-08: 1 ug/kg/h via INTRAVENOUS
  Filled 2015-03-07 (×8): qty 100

## 2015-03-07 MED ORDER — EZETIMIBE 10 MG PO TABS
10.0000 mg | ORAL_TABLET | Freq: Every day | ORAL | Status: DC
  Administered 2015-03-07 – 2015-04-02 (×26): 10 mg
  Filled 2015-03-07 (×26): qty 1

## 2015-03-07 MED ORDER — PIVOT 1.5 CAL PO LIQD
1000.0000 mL | ORAL | Status: DC
  Administered 2015-03-07: 1000 mL

## 2015-03-07 MED ORDER — VITAMIN B-1 100 MG PO TABS: 100.0000 mg | ORAL_TABLET | Freq: Every day | ORAL | Status: DC

## 2015-03-07 MED ORDER — FOLIC ACID 1 MG PO TABS: 1.0000 mg | ORAL_TABLET | Freq: Every day | ORAL | Status: DC

## 2015-03-07 MED ORDER — SODIUM CHLORIDE 0.9 % IV SOLN
Freq: Once | INTRAVENOUS | Status: AC
  Administered 2015-03-07: 12:00:00 via INTRAVENOUS

## 2015-03-07 MED ORDER — IPRATROPIUM-ALBUTEROL 0.5-2.5 (3) MG/3ML IN SOLN
3.0000 mL | RESPIRATORY_TRACT | Status: DC | PRN
  Administered 2015-03-07 – 2015-04-01 (×2): 3 mL via RESPIRATORY_TRACT
  Filled 2015-03-07: qty 3

## 2015-03-07 MED ORDER — DEXMEDETOMIDINE HCL IN NACL 200 MCG/50ML IV SOLN
0.2000 ug/kg/h | INTRAVENOUS | Status: DC
  Filled 2015-03-07: qty 50

## 2015-03-07 MED ORDER — LORAZEPAM 2 MG/ML IJ SOLN: 1.0000 mg | INTRAMUSCULAR | Status: DC | PRN

## 2015-03-07 MED ORDER — BUPROPION HCL 100 MG PO TABS
100.0000 mg | ORAL_TABLET | Freq: Two times a day (BID) | ORAL | Status: DC
  Administered 2015-03-07: 100 mg
  Filled 2015-03-07 (×3): qty 1

## 2015-03-07 MED ORDER — ESCITALOPRAM OXALATE 10 MG PO TABS
20.0000 mg | ORAL_TABLET | Freq: Every day | ORAL | Status: DC
  Administered 2015-03-07 – 2015-04-09 (×33): 20 mg
  Filled 2015-03-07 (×3): qty 2
  Filled 2015-03-07: qty 1
  Filled 2015-03-07 (×15): qty 2
  Filled 2015-03-07: qty 1
  Filled 2015-03-07 (×3): qty 2
  Filled 2015-03-07: qty 1
  Filled 2015-03-07 (×4): qty 2
  Filled 2015-03-07 (×3): qty 1
  Filled 2015-03-07 (×5): qty 2

## 2015-03-07 NOTE — Progress Notes (Signed)
eLink Physician-Brief Progress Note Patient Name: Ricky Molina. DOB: 1965-05-19 MRN: [id number]   Date of Service  03/07/2015  HPI/Events of Note  Asked to help with desatn, 'low returning volumes on vent' - confirmed on camera Appears well sedated  eICU Interventions  CXR ok Vent changed Tubing changed Bagged  - no secretions Trigger lowered Tv better now & BP improved Favor autoPEEP- keep RR 20 range     Intervention Category Major Interventions: Respiratory failure - evaluation and management  ALVA,RAKESH V. 03/07/2015, 6:27 PM

## 2015-03-07 NOTE — Progress Notes (Signed)
RT note- Called to room for PIP elevated, sedation given by RN. Patient then developed a low minute ventilation on full support. 100% assisted with ambu bag while a new ventilator fwas brought to the room. All vital signs remained stable, wil continue to monitor.

## 2015-03-07 NOTE — Progress Notes (Signed)
Called to room per RN regarding vent alarming. Assess pt, pt is sedated and vent keep alarming increase PIP and low minute ventilation. My assessment in the regard of pt status and breathing pattern that pt has OSA and possible CSA. Pt is snoring on the vent and gasping for a breath. Pt goes apnea intermittently on the vent cause him to gasp for the next breath the vent is to deliver. Pt is very desynchronous with the ventilator.

## 2015-03-07 NOTE — Progress Notes (Signed)
Peripherally Inserted Central Catheter/Midline Placement  The IV Nurse has discussed with the patient and/or persons authorized to consent for the patient, the purpose of this procedure and the potential benefits and risks involved with this procedure.  The benefits include less needle sticks, lab draws from the catheter and patient may be discharged home with the catheter.  Risks include, but not limited to, infection, bleeding, blood clot (thrombus formation), and puncture of an artery; nerve damage and irregular heat beat.  Alternatives to this procedure were also discussed.  PICC/Midline Placement Documentation      Telephone consent done with his wife, Gregery Walberg, consent conformation with Lazarus Gowda RN.   Dewain Penning M 03/07/2015, 11:29 AM
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
## 2015-03-07 NOTE — Progress Notes (Signed)
Patient ID: Ricky Molina., male   DOB: [birth year], 50 y.o.   MRN: [id number] I called his wife Lawson Fiscal and updated her on how he is doing. She agrees with platelet TF today. Violeta Gelinas, MD, MPH, FACS Trauma: [phone] General Surgery: [phone]

## 2015-03-07 NOTE — Progress Notes (Signed)
eLink Physician-Brief Progress Note Patient Name: Ricky Molina. DOB: 23-Mar-1965 MRN: [id number]   Date of Service  03/07/2015  HPI/Events of Note  Severe asynchrony with loss of Tv & hypoventilation  eICU Interventions  50 mg Rocuronium x 1 given to assess >> peak pr 36 ,plat 20 - asynchrony resolved duoneb q 6h started Keep RR 16 to avoid autoPEEP     Intervention Category Major Interventions: Respiratory failure - evaluation and management  ALVA,RAKESH V. 03/07/2015, 10:23 PM
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
## 2015-03-07 NOTE — Progress Notes (Signed)
RT note-Procedure being performed, patient remain on current ventilator settings.

## 2015-03-08 ENCOUNTER — Inpatient Hospital Stay (HOSPITAL_COMMUNITY): Payer: BLUE CROSS/BLUE SHIELD

## 2015-03-08 DIAGNOSIS — F10239 Alcohol dependence with withdrawal, unspecified: Secondary | ICD-10-CM

## 2015-03-08 DIAGNOSIS — R569 Unspecified convulsions: Secondary | ICD-10-CM | POA: Diagnosis present

## 2015-03-08 LAB — BLOOD GAS, ARTERIAL
Acid-base deficit: 0.8 mmol/L (ref 0.0–2.0)
Bicarbonate: 24.3 mEq/L — ABNORMAL HIGH (ref 20.0–24.0)
DRAWN BY: 252031
FIO2: 0.5
O2 Saturation: 99.6 %
PEEP: 5 cmH2O
Patient temperature: 98.6
RATE: 14 resp/min
TCO2: 25.7 mmol/L (ref 0–100)
VT: 560 mL
pCO2 arterial: 47 mmHg — ABNORMAL HIGH (ref 35.0–45.0)
pH, Arterial: 7.333 — ABNORMAL LOW (ref 7.350–7.450)
pO2, Arterial: 172 mmHg — ABNORMAL HIGH (ref 80.0–100.0)

## 2015-03-08 LAB — CBC
HCT: 24.6 % — ABNORMAL LOW (ref 39.0–52.0)
Hemoglobin: 7.9 g/dL — ABNORMAL LOW (ref 13.0–17.0)
MCH: 32.5 pg (ref 26.0–34.0)
MCHC: 32.1 g/dL (ref 30.0–36.0)
MCV: 101.2 fL — AB (ref 78.0–100.0)
PLATELETS: 46 10*3/uL — AB (ref 150–400)
RBC: 2.43 MIL/uL — AB (ref 4.22–5.81)
RDW: 17.8 % — AB (ref 11.5–15.5)
WBC: 4.9 10*3/uL (ref 4.0–10.5)

## 2015-03-08 LAB — BASIC METABOLIC PANEL
ANION GAP: 11 (ref 5–15)
BUN: 19 mg/dL (ref 6–20)
CALCIUM: 7.2 mg/dL — AB (ref 8.9–10.3)
CO2: 25 mmol/L (ref 22–32)
Chloride: 111 mmol/L (ref 101–111)
Creatinine, Ser: 1.05 mg/dL (ref 0.61–1.24)
GFR calc Af Amer: >60 mL/min (ref >60)
GLUCOSE: 198 mg/dL — AB (ref 65–99)
POTASSIUM: 4.5 mmol/L (ref 3.5–5.1)
SODIUM: 147 mmol/L — AB (ref 135–145)

## 2015-03-08 LAB — GLUCOSE, CAPILLARY
GLUCOSE-CAPILLARY: 148 mg/dL — AB (ref 65–99)
GLUCOSE-CAPILLARY: 178 mg/dL — AB (ref 65–99)
GLUCOSE-CAPILLARY: 189 mg/dL — AB (ref 65–99)
Glucose-Capillary: 139 mg/dL — ABNORMAL HIGH (ref 65–99)
Glucose-Capillary: 101 mg/dL — ABNORMAL HIGH (ref 65–99)

## 2015-03-08 LAB — PREPARE PLATELET PHERESIS: Unit division: 0

## 2015-03-08 LAB — AMMONIA: AMMONIA: 79 umol/L — AB (ref 9–35)

## 2015-03-08 MED ORDER — FUROSEMIDE 10 MG/ML IJ SOLN
20.0000 mg | Freq: Once | INTRAMUSCULAR | Status: AC
  Administered 2015-03-08: 20 mg via INTRAVENOUS
  Filled 2015-03-08: qty 2

## 2015-03-08 MED ORDER — SODIUM CHLORIDE 0.45 % IV SOLN
INTRAVENOUS | Status: DC
  Administered 2015-03-08 – 2015-03-10 (×3): via INTRAVENOUS
  Filled 2015-03-08 (×4): qty 1000

## 2015-03-08 MED ORDER — THIAMINE HCL 100 MG/ML IJ SOLN
500.0000 mg | Freq: Once | INTRAVENOUS | Status: AC
  Administered 2015-03-08: 500 mg via INTRAVENOUS
  Filled 2015-03-08: qty 5

## 2015-03-08 MED ORDER — SODIUM CHLORIDE 0.45 % IV SOLN
INTRAVENOUS | Status: DC
  Filled 2015-03-08: qty 1000

## 2015-03-08 MED ORDER — PIVOT 1.5 CAL PO LIQD
1000.0000 mL | ORAL | Status: DC
  Administered 2015-03-08 – 2015-03-09 (×2): 1000 mL

## 2015-03-08 MED ORDER — FOLIC ACID 1 MG PO TABS
1.0000 mg | ORAL_TABLET | Freq: Every day | ORAL | Status: DC
  Administered 2015-03-08 – 2015-04-09 (×32): 1 mg
  Filled 2015-03-08 (×31): qty 1

## 2015-03-08 MED ORDER — PANTOPRAZOLE SODIUM 40 MG PO PACK
40.0000 mg | PACK | Freq: Every day | ORAL | Status: DC
  Administered 2015-03-08 – 2015-04-09 (×32): 40 mg
  Filled 2015-03-08 (×32): qty 20

## 2015-03-08 MED ORDER — VITAMIN B-1 100 MG PO TABS
100.0000 mg | ORAL_TABLET | Freq: Every day | ORAL | Status: DC
  Administered 2015-03-08 – 2015-04-09 (×32): 100 mg
  Filled 2015-03-08 (×32): qty 1

## 2015-03-08 MED ORDER — SODIUM CHLORIDE 0.9 % IV SOLN
0.0000 mg/h | INTRAVENOUS | Status: DC
  Administered 2015-03-08: 3 mg/h via INTRAVENOUS
  Administered 2015-03-09 – 2015-03-10 (×4): 5 mg/h via INTRAVENOUS
  Filled 2015-03-08 (×7): qty 10

## 2015-03-08 MED ORDER — MIDAZOLAM HCL 2 MG/2ML IJ SOLN
INTRAMUSCULAR | Status: AC
  Filled 2015-03-08: qty 2

## 2015-03-08 MED ORDER — LORAZEPAM 2 MG/ML IJ SOLN
4.0000 mg | Freq: Once | INTRAMUSCULAR | Status: AC
  Administered 2015-03-08: 4 mg via INTRAVENOUS
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
## 2015-03-08 NOTE — Progress Notes (Signed)
EEG completed; results pending.    

## 2015-03-08 NOTE — Progress Notes (Signed)
Patient noted to have seizure lasting approximately 14 minutes.  PRN ativan given, E-link MD was notified and ordered an additional  ativan which did eventually stop the seizure.  Dr. Dwain Sarna was paged and made aware, no new orders at this time.  Will continue to monitor.
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
## 2015-03-08 NOTE — Progress Notes (Signed)
Pt noted to have a lengthy seizure event lasting over 10 minutes possibly 15 minutes in duration. RN called black box to camera in, ativan given per MD verbal order for RN to give,  ativan which did eventually stop the seizure. RN stated Dr. Dwain Sarna was paged and made aware of event, no new orders at this time.  RT Will continue to monitor.

## 2015-03-08 NOTE — Progress Notes (Signed)
eLink Physician-Brief Progress Note Patient Name: Ricky Molina. DOB: [birth year] MRN: [id number]   Date of Service  03/08/2015  HPI/Events of Note  Seizure.  eICU Interventions  Ativan 4 mg x1 given then advised to call primary.     Intervention Category Major Interventions: Other:  Elbony Mcclimans 03/08/2015, 2:41 AM
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
## 2015-03-08 NOTE — Procedures (Signed)
History: 50 year old male with seizures, presumed alcohol withdrawal  Sedation: Precedex, fentanyl  Technique: This is a 21 channel routine scalp EEG performed at the bedside with bipolar and monopolar montages arranged in accordance to the international 10/20 system of electrode placement. One channel was dedicated to EKG recording.    Background: The background consists of generalized high voltage irregular delta and theta activities. There are occasional sleep spindles seen. There are rare bifrontally distributed discharges which could represents fragments of generalized discharges, though the appearance I suspect is more suggestive of artifact.   Photic stimulation: Physiologic driving is not performed  EEG Abnormalities: 1) Possible generalized epileptiform discharges vs artifact 2) Generalized irregular slow activity.   Clinical Interpretation: This EEG is consistent with a generalized encephalopathy. The discharges recorded could be artifactual in nature and if not, in the setting of recent EtOH withdrawal, the significance would be unclear but could be suggestive on an epileptic predisposition. I would favor repeating the EEG at a later time.     Ritta Slot, MD [facility] [phone]  If 7pm- 7am, please page neurology on call as listed in AMION.

## 2015-03-09 ENCOUNTER — Inpatient Hospital Stay (HOSPITAL_COMMUNITY): Payer: BLUE CROSS/BLUE SHIELD

## 2015-03-09 DIAGNOSIS — R569 Unspecified convulsions: Secondary | ICD-10-CM

## 2015-03-09 LAB — CBC
HEMATOCRIT: 24.5 % — AB (ref 39.0–52.0)
Hemoglobin: 8 g/dL — ABNORMAL LOW (ref 13.0–17.0)
MCH: 33.9 pg (ref 26.0–34.0)
MCHC: 32.7 g/dL (ref 30.0–36.0)
MCV: 103.8 fL — AB (ref 78.0–100.0)
PLATELETS: 60 10*3/uL — AB (ref 150–400)
RBC: 2.36 MIL/uL — AB (ref 4.22–5.81)
RDW: 17.9 % — ABNORMAL HIGH (ref 11.5–15.5)
WBC: 3.6 10*3/uL — AB (ref 4.0–10.5)

## 2015-03-09 LAB — GLUCOSE, CAPILLARY
GLUCOSE-CAPILLARY: 122 mg/dL — AB (ref 65–99)
GLUCOSE-CAPILLARY: 125 mg/dL — AB (ref 65–99)
GLUCOSE-CAPILLARY: 147 mg/dL — AB (ref 65–99)
GLUCOSE-CAPILLARY: 147 mg/dL — AB (ref 65–99)
Glucose-Capillary: 123 mg/dL — ABNORMAL HIGH (ref 65–99)

## 2015-03-09 LAB — BASIC METABOLIC PANEL
ANION GAP: 7 (ref 5–15)
BUN: 14 mg/dL (ref 6–20)
CHLORIDE: 114 mmol/L — AB (ref 101–111)
CO2: 27 mmol/L (ref 22–32)
Calcium: 7.3 mg/dL — ABNORMAL LOW (ref 8.9–10.3)
Creatinine, Ser: 0.84 mg/dL (ref 0.61–1.24)
GFR calc Af Amer: >60 mL/min (ref >60)
GFR calc non Af Amer: >60 mL/min (ref >60)
GLUCOSE: 176 mg/dL — AB (ref 65–99)
POTASSIUM: 4.1 mmol/L (ref 3.5–5.1)
Sodium: 148 mmol/L — ABNORMAL HIGH (ref 135–145)

## 2015-03-09 LAB — TYPE AND SCREEN
ABO/RH(D): A POS
ANTIBODY SCREEN: NEGATIVE
UNIT DIVISION: 0
UNIT DIVISION: 0
UNIT DIVISION: 0
UNIT DIVISION: 0
Unit division: 0
Unit division: 0
Unit division: 0
Unit division: 0
Unit division: 0
Unit division: 0

## 2015-03-09 LAB — BLOOD GAS, ARTERIAL
ACID-BASE EXCESS: 1.5 mmol/L (ref 0.0–2.0)
Bicarbonate: 26.6 mEq/L — ABNORMAL HIGH (ref 20.0–24.0)
FIO2: 0.4
LHR: 16 {breaths}/min
MECHVT: 560 mL
O2 SAT: 97 %
PATIENT TEMPERATURE: 101.8
PCO2 ART: 54.5 mmHg — AB (ref 35.0–45.0)
PEEP/CPAP: 5 cmH2O
PH ART: 7.321 — AB (ref 7.350–7.450)
PO2 ART: 97.1 mmHg (ref 80.0–100.0)
TCO2: 28.1 mmol/L (ref 0–100)

## 2015-03-09 MED ORDER — SODIUM CHLORIDE 0.9 % IV SOLN
500.0000 mg | Freq: Two times a day (BID) | INTRAVENOUS | Status: DC
  Administered 2015-03-09 – 2015-03-23 (×28): 500 mg via INTRAVENOUS
  Filled 2015-03-09 (×31): qty 5

## 2015-03-09 MED ORDER — SODIUM CHLORIDE 0.9 % IV SOLN
1000.0000 mg | Freq: Two times a day (BID) | INTRAVENOUS | Status: DC
  Administered 2015-03-09: 1000 mg via INTRAVENOUS
  Filled 2015-03-09 (×2): qty 10

## 2015-03-09 MED ORDER — PRO-STAT SUGAR FREE PO LIQD
60.0000 mL | Freq: Four times a day (QID) | ORAL | Status: DC
  Administered 2015-03-09 – 2015-03-20 (×44): 60 mL
  Filled 2015-03-09 (×42): qty 60

## 2015-03-09 MED ORDER — PIPERACILLIN-TAZOBACTAM 3.375 G IVPB
3.3750 g | Freq: Three times a day (TID) | INTRAVENOUS | Status: DC
Start: 2015-03-09 — End: 2015-03-12
  Administered 2015-03-09 – 2015-03-12 (×9): 3.375 g via INTRAVENOUS
  Filled 2015-03-09 (×12): qty 50

## 2015-03-09 MED ORDER — FUROSEMIDE 10 MG/ML IJ SOLN
40.0000 mg | Freq: Once | INTRAMUSCULAR | Status: AC
  Administered 2015-03-09: 40 mg via INTRAVENOUS
  Filled 2015-03-09: qty 4

## 2015-03-09 MED ORDER — PIVOT 1.5 CAL PO LIQD
1000.0000 mL | ORAL | Status: DC
  Administered 2015-03-10 – 2015-03-20 (×12): 1000 mL
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
## 2015-03-09 NOTE — Progress Notes (Signed)
Pt had an seizure episode lasted 10 minutes in duration. Pt was tachy throughout seizure in the 160's. Versed/ativan given per RN. Seizure decrease in intensity and eventfully stopped. RN/NT/RRT at bedside witness event. RT will continue to monitor pt status.

## 2015-03-09 NOTE — Procedures (Signed)
ELECTROENCEPHALOGRAM REPORT  Date of Study: 03/09/2015  Patient's Name: Ricky Molina. MRN: [id number] Date of Birth: Jun 15, 1965  Indication: 50 year old male with presumed alcohol-withdrawal seizures.  Medications:  chlorhexidine gluconate (PERIDEX) 0.12 % solution 15 mL  escitalopram (LEXAPRO) tablet 20 mg  levETIRAcetam (KEPPRA) 500 mg in sodium chloride 0.9 % 100 mL IVPB  LORazepam (ATIVAN) injection 1-2 mg  midazolam (VERSED) 50 mg in sodium chloride 0.9 % 50 mL (1 mg/mL) infusion  norepinephrine (LEVOPHED) 4 mg in dextrose 5 % 250 mL (0.016 mg/mL) infusion  Technical Summary: This is a multichannel digital EEG recording, using the international 10-20 placement system with electrodes applied with paste and impedances below 5000 ohms.    Description: The EEG background shows symmetric generalized slowing consisting of delta and intermixed alpha activity with no discernible posterior dominant rhythm.  No focal or generalized epileptiform discharges are seen.  ECG revealed normal cardiac rate and rhythm.  Impression: This is an abnormal routine EEG due to generalized background slowing, indicative of diffuse physiologic cerebral dysfunction.  This is a nonspecific finding which may be due to toxic-metabolic, infectious or pharmacologic etiology.  Adam R. Everlena Debella, DO

## 2015-03-09 NOTE — Progress Notes (Signed)
EEG completed; results pending.    

## 2015-03-09 NOTE — Progress Notes (Signed)
Patient had another seizure lasting approximately 10 minutes, versed bolus and ativan given.  Dr. Roseanne Reno was paged and notified, orders received to begin keppra.  Will continue to monitor.

## 2015-03-09 NOTE — Progress Notes (Signed)
Subjective: Recurrent seizure early this AM, given ativan and keppra.   Exam: Filed Vitals:   03/09/15 0645 03/09/15 0722  BP:    Pulse: 108   Temp:  99.9 F (37.7 C)  Resp: 16    Gen: In bed, intubated Resp: ventilated Abd: soft, nt   He is sedated on midazolam at 5 mg/hr and fentanyl Neuro: MS: does not open eyes or follow commands.  RU:EAVW are slightly upwards looking, PERRL Motor: wtihdraws to noxious stimuli x 4.  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 Given abnormal EEG and recurrent seizures, I agree with keppra at least in the short term, though it does not typically change the amount of benzodiazepine needed. I would continue this for now. Also, given recurrent seizure, I would favor further imaging.   Recommendations: 1) wean versed as tolerated.  2) continue keppra  3) MRI brain  Ritta Slot, MD [facility] [phone]  If 7pm- 7am, please page neurology on call as listed in AMION.
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
## 2015-03-09 DEATH — deceased

## 2015-03-10 ENCOUNTER — Inpatient Hospital Stay (HOSPITAL_COMMUNITY): Payer: BLUE CROSS/BLUE SHIELD

## 2015-03-10 ENCOUNTER — Encounter (HOSPITAL_COMMUNITY): Payer: Self-pay

## 2015-03-10 LAB — GLUCOSE, CAPILLARY
GLUCOSE-CAPILLARY: 133 mg/dL — AB (ref 65–99)
GLUCOSE-CAPILLARY: 137 mg/dL — AB (ref 65–99)
GLUCOSE-CAPILLARY: 149 mg/dL — AB (ref 65–99)
GLUCOSE-CAPILLARY: 150 mg/dL — AB (ref 65–99)
Glucose-Capillary: 153 mg/dL — ABNORMAL HIGH (ref 65–99)
Glucose-Capillary: 131 mg/dL — ABNORMAL HIGH (ref 65–99)
Glucose-Capillary: 148 mg/dL — ABNORMAL HIGH (ref 65–99)

## 2015-03-10 LAB — CBC WITH DIFFERENTIAL/PLATELET
BASOS PCT: 0 %
Basophils Absolute: 0 10*3/uL (ref 0.0–0.1)
EOS PCT: 2 %
Eosinophils Absolute: 0.1 10*3/uL (ref 0.0–0.7)
HCT: 24.4 % — ABNORMAL LOW (ref 39.0–52.0)
Hemoglobin: 7.8 g/dL — ABNORMAL LOW (ref 13.0–17.0)
Lymphocytes Relative: 31 %
Lymphs Abs: 1 10*3/uL (ref 0.7–4.0)
MCH: 33.5 pg (ref 26.0–34.0)
MCHC: 32 g/dL (ref 30.0–36.0)
MCV: 104.7 fL — AB (ref 78.0–100.0)
MONO ABS: 0.4 10*3/uL (ref 0.1–1.0)
MONOS PCT: 11 %
Neutro Abs: 1.8 10*3/uL (ref 1.7–7.7)
Neutrophils Relative %: 56 %
PLATELETS: 58 10*3/uL — AB (ref 150–400)
RBC: 2.33 MIL/uL — ABNORMAL LOW (ref 4.22–5.81)
RDW: 18 % — AB (ref 11.5–15.5)
WBC: 3.3 10*3/uL — ABNORMAL LOW (ref 4.0–10.5)

## 2015-03-10 LAB — AMMONIA: Ammonia: 118 umol/L — ABNORMAL HIGH (ref 9–35)

## 2015-03-10 LAB — BASIC METABOLIC PANEL
Anion gap: 8 (ref 5–15)
BUN: 12 mg/dL (ref 6–20)
CALCIUM: 7.6 mg/dL — AB (ref 8.9–10.3)
CO2: 31 mmol/L (ref 22–32)
Chloride: 112 mmol/L — ABNORMAL HIGH (ref 101–111)
Creatinine, Ser: 0.74 mg/dL (ref 0.61–1.24)
GFR calc Af Amer: >60 mL/min (ref >60)
GLUCOSE: 153 mg/dL — AB (ref 65–99)
Potassium: 3.5 mmol/L (ref 3.5–5.1)
Sodium: 151 mmol/L — ABNORMAL HIGH (ref 135–145)

## 2015-03-10 MED ORDER — FUROSEMIDE 10 MG/ML IJ SOLN
20.0000 mg | Freq: Once | INTRAMUSCULAR | Status: AC
  Administered 2015-03-10: 20 mg via INTRAVENOUS
  Filled 2015-03-10: qty 2

## 2015-03-10 MED ORDER — FREE WATER
200.0000 mL | Freq: Three times a day (TID) | Status: DC
  Administered 2015-03-10 – 2015-03-12 (×7): 200 mL

## 2015-03-10 NOTE — Progress Notes (Signed)
LTM EEG started 

## 2015-03-10 NOTE — Progress Notes (Signed)
60ml Fentanyl infusion wasted and 20ml versed infusion wasted in the sink.  Lynetta Mare RN witnessed this waste.  Jacqulyn Cane RN, BSN, CCRN
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
## 2015-03-10 NOTE — Progress Notes (Signed)
Subjective: Apparently had another brief seizure overnight  Exam: Filed Vitals:   03/10/15 1600 03/10/15 1700  BP: 108/60 107/59  Pulse: 103 114  Temp:    Resp: 16 16   Gen: In bed, intubated Resp: ventilated Abd: soft, nt  Sedation was stopped several hours prior to exam Neuro: MS: does not open eyes or follow commands.  ZH:YQMV are slightly upwards looking, PERRL Motor: wtihdraws to noxious stimuli x 4.  Sensory: As above  Pertinent Labs: Calcium corrects to 8.8  MRI brain with heterotopia  Impression: 50 yo M with possible EtOH  withdrawal seizures. I'm not certain that the event seen overnight was actually seizure, but given continued concern and the fact that he is not waking up now off of sedation I would favor further monitoring to assess for subclinical seizures.  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  Recommendations: 1) continue Keppra 2) LTM EEG  3) check ammonia 4) we will continue to follow  Ritta Slot, MD [facility] [phone]  If 7pm- 7am, please page neurology on call as listed in AMION.

## 2015-03-11 ENCOUNTER — Inpatient Hospital Stay (HOSPITAL_COMMUNITY): Payer: BLUE CROSS/BLUE SHIELD

## 2015-03-11 DIAGNOSIS — E7220 Disorder of urea cycle metabolism, unspecified: Secondary | ICD-10-CM

## 2015-03-11 DIAGNOSIS — K7682 Hepatic encephalopathy: Secondary | ICD-10-CM | POA: Diagnosis present

## 2015-03-11 DIAGNOSIS — K7290 Hepatic failure, unspecified without coma: Secondary | ICD-10-CM | POA: Diagnosis present

## 2015-03-11 LAB — BASIC METABOLIC PANEL
ANION GAP: 7 (ref 5–15)
BUN: 16 mg/dL (ref 6–20)
CALCIUM: 8 mg/dL — AB (ref 8.9–10.3)
CHLORIDE: 113 mmol/L — AB (ref 101–111)
CO2: 33 mmol/L — AB (ref 22–32)
Creatinine, Ser: 0.74 mg/dL (ref 0.61–1.24)
GFR calc Af Amer: >60 mL/min (ref >60)
GFR calc non Af Amer: >60 mL/min (ref >60)
GLUCOSE: 162 mg/dL — AB (ref 65–99)
POTASSIUM: 4 mmol/L (ref 3.5–5.1)
Sodium: 153 mmol/L — ABNORMAL HIGH (ref 135–145)

## 2015-03-11 LAB — CULTURE, RESPIRATORY W GRAM STAIN

## 2015-03-11 LAB — POCT I-STAT 3, ART BLOOD GAS (G3+)
Acid-Base Excess: 8 mmol/L — ABNORMAL HIGH (ref 0.0–2.0)
Bicarbonate: 33.1 mEq/L — ABNORMAL HIGH (ref 20.0–24.0)
O2 SAT: 95 %
PCO2 ART: 56.3 mmHg — AB (ref 35.0–45.0)
TCO2: 35 mmol/L (ref 0–100)
pH, Arterial: 7.383 (ref 7.350–7.450)
pO2, Arterial: 83 mmHg (ref 80.0–100.0)

## 2015-03-11 LAB — CBC
HEMATOCRIT: 27.7 % — AB (ref 39.0–52.0)
HEMOGLOBIN: 8.5 g/dL — AB (ref 13.0–17.0)
MCH: 33.2 pg (ref 26.0–34.0)
MCHC: 30.7 g/dL (ref 30.0–36.0)
MCV: 108.2 fL — AB (ref 78.0–100.0)
Platelets: 74 10*3/uL — ABNORMAL LOW (ref 150–400)
RBC: 2.56 MIL/uL — ABNORMAL LOW (ref 4.22–5.81)
RDW: 19 % — AB (ref 11.5–15.5)
WBC: 3.7 10*3/uL — ABNORMAL LOW (ref 4.0–10.5)

## 2015-03-11 LAB — GLUCOSE, CAPILLARY
GLUCOSE-CAPILLARY: 140 mg/dL — AB (ref 65–99)
GLUCOSE-CAPILLARY: 152 mg/dL — AB (ref 65–99)
GLUCOSE-CAPILLARY: 189 mg/dL — AB (ref 65–99)
GLUCOSE-CAPILLARY: 181 mg/dL — AB (ref 65–99)
Glucose-Capillary: 156 mg/dL — ABNORMAL HIGH (ref 65–99)
Glucose-Capillary: 158 mg/dL — ABNORMAL HIGH (ref 65–99)

## 2015-03-11 LAB — CULTURE, RESPIRATORY: SPECIAL REQUESTS: NORMAL

## 2015-03-11 MED ORDER — POTASSIUM CL IN DEXTROSE 5% 20 MEQ/L IV SOLN
20.0000 meq | INTRAVENOUS | Status: DC
  Administered 2015-03-11 – 2015-03-19 (×9): 20 meq via INTRAVENOUS
  Filled 2015-03-11 (×14): qty 1000

## 2015-03-11 MED ORDER — LACTULOSE 10 GM/15ML PO SOLN
30.0000 g | Freq: Two times a day (BID) | ORAL | Status: DC
  Administered 2015-03-11 (×2): 30 g
  Filled 2015-03-11 (×4): qty 45

## 2015-03-11 MED ORDER — FUROSEMIDE 10 MG/ML IJ SOLN
40.0000 mg | Freq: Once | INTRAMUSCULAR | Status: AC
  Administered 2015-03-11: 40 mg via INTRAVENOUS
  Filled 2015-03-11: qty 4

## 2015-03-11 MED ORDER — INSULIN GLARGINE 100 UNIT/ML ~~LOC~~ SOLN
10.0000 [IU] | Freq: Every day | SUBCUTANEOUS | Status: DC
  Administered 2015-03-11 – 2015-03-12 (×2): 10 [IU] via SUBCUTANEOUS
  Filled 2015-03-11 (×3): qty 0.1

## 2015-03-11 NOTE — Progress Notes (Signed)
LTM EEG D/C'd per Dr Amada Jupiter. Skin breakdown noted under the Fp1 electrode

## 2015-03-11 NOTE — Progress Notes (Signed)
Follow up - Trauma and Critical Care  Patient DetaiGareld Obrecht Strausbaugh Jr28. is an 50 y.o. male.  Lines/tubes : Airway 7.5 mm (Active)  Secured at (cm) 25 cm 03/11/2015  4:40 AM  Measured From Lips 03/11/2015  4:40 AM  Secured Location Center 03/11/2015  4:40 AM  Secured By [employer] 03/11/2015  4:40 AM  Tube Holder Repositioned Yes 03/11/2015  4:40 AM  Cuff Pressure (cm H2O) 25 cm H2O 03/10/2015 12:11 PM  Site Condition Dry 03/11/2015  4:40 AM     PICC Triple Lumen 03/07/15 PICC Right Basilic 48 cm 0 cm (Active)  Indication for Insertion or Continuance of Line Vasoactive infusions;Prolonged intravenous therapies 03/10/2015  8:00 PM  Exposed Catheter (cm) 0 cm 03/07/2015 11:20 AM  Site Assessment Clean;Dry;Intact 03/10/2015  8:00 PM  Lumen #1 Status Infusing 03/10/2015  8:00 PM  Lumen #2 Status Infusing 03/10/2015  8:00 PM  Lumen #3 Status Infusing 03/10/2015  8:00 PM  Dressing Type Transparent;Occlusive 03/10/2015  8:00 PM  Dressing Status Antimicrobial disc in place;Clean;Dry;Intact 03/10/2015  8:00 PM  Line Care Connections checked and tightened 03/10/2015  8:00 PM  Dressing Change Due 03/14/15 03/10/2015 12:00 PM     Arterial Line 23-Mar-2015 Left Radial (Active)  Site Assessment Clean;Dry;Intact 03/10/2015  8:00 PM  Line Status Pulsatile blood flow 03/10/2015  8:00 PM  Art Line Waveform Appropriate;Square wave test performed 03/10/2015  8:00 PM  Art Line Interventions Leveled;Zeroed and calibrated 03/10/2015  8:00 PM  Color/Movement/Sensation Capillary refill less than 3 sec 03/10/2015  8:00 PM  Dressing Type Transparent;Occlusive 03/10/2015  8:00 PM  Dressing Status Clean;Dry;Intact 03/10/2015  8:00 PM  Dressing Change Due 03/12/15 03/10/2015  8:00 PM     NG/OG Tube Nasogastric 16 Fr. Right nare (Active)  Placement Verification Auscultation 03/11/2015  4:30 AM  Site Assessment Clean;Dry;Intact 03/11/2015  4:30 AM  Status Infusing tube feed;Irrigated 03/11/2015  4:30 AM  Drainage Appearance Bloody;Brown  03/07/2015  8:00 AM  Intake (mL) 30 mL 03/11/2015  4:30 AM  Output (mL) 0 mL 03/07/2015 12:00 PM     Urethral Catheter dimattia,v. RN Non-latex 14 Fr. (Active)  Indication for Insertion or Continuance of Catheter Unstable critical patients (first 24-48 hours);Aggressive IV diuresis 03/10/2015  8:00 PM  Site Assessment Clean;Intact 03/10/2015  8:00 PM  Catheter Maintenance Bag below level of bladder;Catheter secured;Drainage bag/tubing not touching floor;Insertion date on drainage bag;No dependent loops;Seal intact 03/10/2015  8:00 PM  Collection Container Standard drainage bag 03/10/2015  8:00 PM  Securement Method Leg strap 03/10/2015  8:00 PM  Urinary Catheter Interventions Unclamped 03/09/2015  8:00 AM  Output (mL) 100 mL 03/11/2015  6:00 AM    Microbiology/Sepsis markers: Results for orders placed or performed during the hospital encounter of 03-23-15  MRSA PCR Screening     Status: None   Collection Time: 03/06/15  5:23 AM  Result Value Ref Range Status   MRSA by PCR NEGATIVE NEGATIVE Final    Comment:        The GeneXpert MRSA Assay (FDA approved for NASAL specimens only), is one component of a comprehensive MRSA colonization surveillance program. It is not intended to diagnose MRSA infection nor to guide or monitor treatment for MRSA infections.   Culture, respiratory (NON-Expectorated)     Status: None (Preliminary result)   Collection Time: 03/09/15 11:22 AM  Result Value Ref Range Status   Specimen Description TRACHEAL ASPIRATE  Final   Special Requests Normal  Final   Gram  Stain   Final    ABUNDANT WBC PRESENT, PREDOMINANTLY PMN FEW SQUAMOUS EPITHELIAL CELLS PRESENT FEW GRAM POSITIVE COCCI IN PAIRS Performed at [facility]    Culture NO GROWTH Performed at [facility]   Final   Report Status PENDING  Incomplete    Anti-infectives:  Anti-infectives    Start     Dose/Rate Route Frequency Ordered Stop   03/09/15 1030  piperacillin-tazobactam (ZOSYN) IVPB  3.375 g     3.375 g 12.5 mL/hr over 240 Minutes Intravenous Every 8 hours 03/09/15 1000        Best Practice/Protocols:  VTE Prophylaxis: Mechanical GI Prophylaxis: Proton Pump Inhibitor Patient is on no pressors and no sedation.  Consults: Treatment Team:  Ricky Groom, MD    Events:  Subjective:    Overnight Issues: No report of siezure activity that I know of overnight.  Objective:  Vital signs for last 24 hours: Temp:  [98.7 F (37.1 C)-100 F (37.8 C)] 99.2 F (37.3 C) (02/03 0400) Pulse Rate:  [103-124] 119 (02/03 0700) Resp:  [14-16] 15 (02/03 0700) BP: (100-132)/(56-73) 127/66 mmHg (02/03 0600) SpO2:  [93 %-99 %] 95 % (02/03 0700) Arterial Line BP: (97-145)/(46-64) 136/59 mmHg (02/03 0700) FiO2 (%):  [40 %] 40 % (02/03 0440) Weight:  [134.1 kg (295 lb 10.2 oz)] 134.1 kg (295 lb 10.2 oz) (02/03 0500)  Hemodynamic parameters for last 24 hours: CVP:  [6 mmHg-8 mmHg] 6 mmHg  Intake/Output from previous day: 02/02 0701 - 02/03 0700 In: 3274.5 [I.V.:1122; NG/GT:1810; IV Piggyback:342.5] Out: 2445 [Urine:2445]  Intake/Output this shift:    Vent settings for last 24 hours: Vent Mode:  [-] PRVC FiO2 (%):  [40 %] 40 % Set Rate:  [16 bmp] 16 bmp Vt Set:  [560 mL] 560 mL PEEP:  [5 cmH20] 5 cmH20 Pressure Support:  [12 cmH20] 12 cmH20 Plateau Pressure:  [16 cmH20-22 cmH20] 21 cmH20  Physical Exam:  General: no respiratory distress and comatose Neuro: RASS -3 or deeper and no response to any type of stimulus Resp: clear to auscultation bilaterally and now weaning fairly well early onset CVS: Sinus tachycardia GI: distended, hypoactive BS and seems edematous and somewhat tense. Extremities: edema 2+, edema 3+ and globally edematous  Results for orders placed or performed during the hospital encounter of 02/13/2015 (from the past 24 hour(s))  Glucose, capillary     Status: Abnormal   Collection Time: 03/10/15  9:16 AM  Result Value Ref Range    Glucose-Capillary 133 (H) 65 - 99 mg/dL   Comment 1 Capillary Specimen    Comment 2 Notify RN   Glucose, capillary     Status: Abnormal   Collection Time: 03/10/15 11:24 AM  Result Value Ref Range   Glucose-Capillary 149 (H) 65 - 99 mg/dL   Comment 1 Capillary Specimen    Comment 2 Notify RN   Glucose, capillary     Status: Abnormal   Collection Time: 03/10/15  4:18 PM  Result Value Ref Range   Glucose-Capillary 137 (H) 65 - 99 mg/dL   Comment 1 Notify RN   Ammonia     Status: Abnormal   Collection Time: 03/10/15  6:03 PM  Result Value Ref Range   Ammonia 118 (H) 9 - 35 umol/L  Glucose, capillary     Status: Abnormal   Collection Time: 03/10/15  7:38 PM  Result Value Ref Range   Glucose-Capillary 148 (H) 65 - 99 mg/dL   Comment 1 Capillary Specimen  Comment 2 Notify RN    Comment 3 Document in Chart   Glucose, capillary     Status: Abnormal   Collection Time: 03/11/15 12:02 AM  Result Value Ref Range   Glucose-Capillary 158 (H) 65 - 99 mg/dL   Comment 1 Capillary Specimen    Comment 2 Notify RN    Comment 3 Document in Chart   Glucose, capillary     Status: Abnormal   Collection Time: 03/11/15  3:50 AM  Result Value Ref Range   Glucose-Capillary 140 (H) 65 - 99 mg/dL   Comment 1 Capillary Specimen    Comment 2 Notify RN    Comment 3 Document in Chart   CBC     Status: Abnormal   Collection Time: 03/11/15  4:26 AM  Result Value Ref Range   WBC 3.7 (L) 4.0 - 10.5 K/uL   RBC 2.56 (L) 4.22 - 5.81 MIL/uL   Hemoglobin 8.5 (L) 13.0 - 17.0 g/dL   HCT 16.1 (L) 09.6 - 04.5 %   MCV 108.2 (H) 78.0 - 100.0 fL   MCH 33.2 26.0 - 34.0 pg   MCHC 30.7 30.0 - 36.0 g/dL   RDW 40.9 (H) 81.1 - 91.4 %   Platelets 74 (L) 150 - 400 K/uL  Basic metabolic panel     Status: Abnormal   Collection Time: 03/11/15  4:26 AM  Result Value Ref Range   Sodium 153 (H) 135 - 145 mmol/L   Potassium 4.0 3.5 - 5.1 mmol/L   Chloride 113 (H) 101 - 111 mmol/L   CO2 33 (H) 22 - 32 mmol/L   Glucose,  Bld 162 (H) 65 - 99 mg/dL   BUN 16 6 - 20 mg/dL   Creatinine, Ser 7.82 0.61 - 1.24 mg/dL   Calcium 8.0 (L) 8.9 - 10.3 mg/dL   GFR calc non Af Amer >60 >60 mL/min   GFR calc Af Amer >60 >60 mL/min   Anion gap 7 5 - 15     Assessment/Plan:   NEURO  Altered Mental Status:  coma, encephalopathy and probably metabolic   Plan: CPM  PULM  Atelectasis/collapse (focal and mostly in the RLL but also some atelectasis in the LLL)   Plan: Consider Lasix as the patient does have some pulmonary edema.  Wean as tolerated,b ut will not extubate.  CARDIO  Sinus Tachycardia   Plan: Non-specific, requires no specific treatment.  RENAL  Hypernatremia moderate (146 - 155 meq/dl) and etiology unknown Hypervolemia and Third Spacing   Plan: Change IVFs, Lasix,   GI  Status post laparotomy for hemoperitoneum.  Now has somewhat of an ileus, but is tolerating tube feedings at goal rate.   Plan: Lactulose for constipation and hyper-ammonianemia  ID  No known infectious problems.   Plan: CPM  HEME  Anemia acute blood loss anemia)   Plan: Hemoglobin acceptable at 8.5  ENDO No known problems   Plan: CPM  Global Issues  Vent dependent, probable metabolic encephalopathy.  Increased ammonia level, will use Lactulose.  Recheck amonia level tomorrow AM.  Continue to hold sedation.  Continue tube feedings.    LOS: 6 days   Additional comments:I reviewed the patient's new clinical lab test results. cbc/bmet/ammonia level and I reviewed the patients new imaging test results. cxr  Critical Care Total Time*: 45 Minutes  Ricky Molina 03/11/2015  *Care during the described time interval was provided by me and/or other providers on the critical care team.  I have reviewed this patient's available  data, including medical history, events of note, physical examination and test results as part of my evaluation.

## 2015-03-11 NOTE — Progress Notes (Signed)
Pt remains on ventilator; off sedation most of the day and not responsive.  Ammonia and Na levels elevated.  Per report, wife disabled and unable to visit.  Will continue to follow progress.    Quintella Baton, RN, BSN  Trauma/Neuro ICU Case Manager [phone]

## 2015-03-11 NOTE — Progress Notes (Addendum)
Subjective: No further seizures.   Exam: Filed Vitals:   03/11/15 0700 03/11/15 0725  BP:  137/69  Pulse: 119 124  Temp:    Resp: 15 11   Gen: In bed, intubated Resp: ventilated Abd: soft, nt  Sedation was stopped several hours prior to exam Neuro: MS: does not open eyes or follow commands.  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 I'm not certain that the event seen overnight was actually seizure, but given continued concern and the fact that he is not waking up now off of sedation I would favor further monitoring to assess for subclinical seizures.  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  I suspect his current encephalopathy is due to hyperammonemia, e.g. Hepatic encephalppathy.   Recommendations: 1) continue Keppra 2) discontinue LTM EEG  3) lactulose  4) we will continue to follow  Ritta Slot, MD [facility] [phone]  If 7pm- 7am, please page neurology on call as listed in AMION.
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
## 2015-03-11 NOTE — Plan of Care (Signed)
Problem: Activity: Goal: Ability to tolerate increased activity will improve Outcome: Not Progressing Pt remains ventilated. Goal: Mobility will improve Outcome: Not Progressing Pt remains ventilated.  Problem: Education: Goal: Will regain or maintain usual level of consciousness Outcome: Not Progressing Currently being followed by Neuro  Problem: Nutrition: Goal: Ability to attain and maintain optimal nutritional status will improve Outcome: Progressing Pt receiving tube feeds

## 2015-03-12 ENCOUNTER — Inpatient Hospital Stay (HOSPITAL_COMMUNITY): Payer: BLUE CROSS/BLUE SHIELD

## 2015-03-12 LAB — CBC WITH DIFFERENTIAL/PLATELET
BASOS ABS: 0 10*3/uL (ref 0.0–0.1)
BASOS PCT: 0 %
EOS ABS: 0.1 10*3/uL (ref 0.0–0.7)
EOS PCT: 1 %
HCT: 27.8 % — ABNORMAL LOW (ref 39.0–52.0)
Hemoglobin: 8.5 g/dL — ABNORMAL LOW (ref 13.0–17.0)
Lymphocytes Relative: 20 %
Lymphs Abs: 0.9 10*3/uL (ref 0.7–4.0)
MCH: 33.6 pg (ref 26.0–34.0)
MCHC: 30.6 g/dL (ref 30.0–36.0)
MCV: 109.9 fL — ABNORMAL HIGH (ref 78.0–100.0)
MONO ABS: 0.6 10*3/uL (ref 0.1–1.0)
MONOS PCT: 13 %
NEUTROS ABS: 3.1 10*3/uL (ref 1.7–7.7)
Neutrophils Relative %: 66 %
PLATELETS: 83 10*3/uL — AB (ref 150–400)
RBC: 2.53 MIL/uL — ABNORMAL LOW (ref 4.22–5.81)
RDW: 20.1 % — AB (ref 11.5–15.5)
WBC: 4.7 10*3/uL (ref 4.0–10.5)

## 2015-03-12 LAB — GLUCOSE, CAPILLARY
GLUCOSE-CAPILLARY: 181 mg/dL — AB (ref 65–99)
GLUCOSE-CAPILLARY: 176 mg/dL — AB (ref 65–99)
GLUCOSE-CAPILLARY: 171 mg/dL — AB (ref 65–99)
Glucose-Capillary: 169 mg/dL — ABNORMAL HIGH (ref 65–99)
Glucose-Capillary: 174 mg/dL — ABNORMAL HIGH (ref 65–99)
Glucose-Capillary: 189 mg/dL — ABNORMAL HIGH (ref 65–99)

## 2015-03-12 LAB — BASIC METABOLIC PANEL
ANION GAP: 10 (ref 5–15)
BUN: 16 mg/dL (ref 6–20)
CALCIUM: 7.5 mg/dL — AB (ref 8.9–10.3)
CO2: 29 mmol/L (ref 22–32)
CREATININE: 0.67 mg/dL (ref 0.61–1.24)
Chloride: 113 mmol/L — ABNORMAL HIGH (ref 101–111)
Glucose, Bld: 212 mg/dL — ABNORMAL HIGH (ref 65–99)
Potassium: 3.4 mmol/L — ABNORMAL LOW (ref 3.5–5.1)
SODIUM: 152 mmol/L — AB (ref 135–145)

## 2015-03-12 LAB — AMMONIA: AMMONIA: 161 umol/L — AB (ref 9–35)

## 2015-03-12 MED ORDER — FREE WATER
200.0000 mL | Freq: Four times a day (QID) | Status: DC
  Administered 2015-03-12 – 2015-03-13 (×4): 200 mL

## 2015-03-12 MED ORDER — INSULIN ASPART 100 UNIT/ML ~~LOC~~ SOLN
0.0000 [IU] | SUBCUTANEOUS | Status: DC
  Administered 2015-03-12 – 2015-03-14 (×10): 4 [IU] via SUBCUTANEOUS
  Administered 2015-03-14 (×4): 7 [IU] via SUBCUTANEOUS
  Administered 2015-03-14: 11 [IU] via SUBCUTANEOUS
  Administered 2015-03-15 (×2): 7 [IU] via SUBCUTANEOUS
  Administered 2015-03-15 (×4): 4 [IU] via SUBCUTANEOUS
  Administered 2015-03-15: 11 [IU] via SUBCUTANEOUS
  Administered 2015-03-16 (×2): 7 [IU] via SUBCUTANEOUS
  Administered 2015-03-16: 4 [IU] via SUBCUTANEOUS
  Administered 2015-03-16 – 2015-03-17 (×3): 7 [IU] via SUBCUTANEOUS
  Administered 2015-03-17 (×3): 4 [IU] via SUBCUTANEOUS
  Administered 2015-03-17: 7 [IU] via SUBCUTANEOUS
  Administered 2015-03-17: 3 [IU] via SUBCUTANEOUS
  Administered 2015-03-17: 7 [IU] via SUBCUTANEOUS
  Administered 2015-03-18 (×2): 4 [IU] via SUBCUTANEOUS
  Administered 2015-03-18: 7 [IU] via SUBCUTANEOUS
  Administered 2015-03-18: 3 [IU] via SUBCUTANEOUS
  Administered 2015-03-18: 4 [IU] via SUBCUTANEOUS
  Administered 2015-03-18: 3 [IU] via SUBCUTANEOUS
  Administered 2015-03-19 (×4): 4 [IU] via SUBCUTANEOUS
  Administered 2015-03-19: 7 [IU] via SUBCUTANEOUS
  Administered 2015-03-19 – 2015-03-20 (×2): 4 [IU] via SUBCUTANEOUS
  Administered 2015-03-20: 7 [IU] via SUBCUTANEOUS
  Administered 2015-03-20 (×2): 4 [IU] via SUBCUTANEOUS
  Administered 2015-03-20 – 2015-03-21 (×3): 3 [IU] via SUBCUTANEOUS
  Administered 2015-03-21: 4 [IU] via SUBCUTANEOUS
  Administered 2015-03-21 – 2015-03-22 (×6): 3 [IU] via SUBCUTANEOUS
  Administered 2015-03-22: 4 [IU] via SUBCUTANEOUS
  Administered 2015-03-22 – 2015-03-23 (×2): 3 [IU] via SUBCUTANEOUS
  Administered 2015-03-23 (×3): 4 [IU] via SUBCUTANEOUS
  Administered 2015-03-23: 3 [IU] via SUBCUTANEOUS
  Administered 2015-03-23: 4 [IU] via SUBCUTANEOUS
  Administered 2015-03-24 (×3): 7 [IU] via SUBCUTANEOUS
  Administered 2015-03-24: 4 [IU] via SUBCUTANEOUS
  Administered 2015-03-24 – 2015-03-25 (×6): 7 [IU] via SUBCUTANEOUS
  Administered 2015-03-25: 4 [IU] via SUBCUTANEOUS
  Administered 2015-03-25 – 2015-03-26 (×2): 7 [IU] via SUBCUTANEOUS
  Administered 2015-03-26: 4 [IU] via SUBCUTANEOUS
  Administered 2015-03-26 (×3): 7 [IU] via SUBCUTANEOUS
  Administered 2015-03-26: 4 [IU] via SUBCUTANEOUS
  Administered 2015-03-27 (×3): 7 [IU] via SUBCUTANEOUS

## 2015-03-12 MED ORDER — LACTULOSE ENEMA
300.0000 mL | Freq: Once | ORAL | Status: AC
  Administered 2015-03-12: 300 mL via RECTAL
  Filled 2015-03-12: qty 300

## 2015-03-12 MED ORDER — METOPROLOL TARTRATE 1 MG/ML IV SOLN
5.0000 mg | INTRAVENOUS | Status: AC | PRN
  Administered 2015-03-12: 5 mg via INTRAVENOUS
  Filled 2015-03-12: qty 5

## 2015-03-12 MED ORDER — LACTULOSE 10 GM/15ML PO SOLN
30.0000 g | Freq: Four times a day (QID) | ORAL | Status: DC
  Administered 2015-03-12 – 2015-03-21 (×34): 30 g
  Filled 2015-03-12 (×37): qty 45

## 2015-03-12 MED ORDER — BISACODYL 10 MG RE SUPP
10.0000 mg | Freq: Once | RECTAL | Status: AC
  Administered 2015-03-12: 10 mg via RECTAL
  Filled 2015-03-12: qty 1

## 2015-03-12 MED ORDER — CEFTRIAXONE SODIUM 1 G IJ SOLR
1.0000 g | INTRAMUSCULAR | Status: DC
  Administered 2015-03-12 – 2015-03-13 (×2): 1 g via INTRAMUSCULAR
  Filled 2015-03-12 (×5): qty 10

## 2015-03-12 NOTE — Progress Notes (Addendum)
Abdominal distention noted to be increasing. Gastric residual checked and found to be and pt has not had a BM in several days. Trauma MD on call ([place]) paged and spoke with, orders received to hold TF and let AM team reassess. Will continue to monitor closely.  Modena Jansky RN  [address] [place]
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
## 2015-03-12 NOTE — Progress Notes (Signed)
Subjective: Lactulose did not result in stools, ammonia incerased.   Exam: Filed Vitals:   03/12/15 0740 03/12/15 0849  BP:    Pulse: 135   Temp:  101.6 F (38.7 C)  Resp: 20    Gen: In bed, intubated Resp: ventilated Abd: soft, nt  Sedation was stopped several hours prior to exam Neuro: MS: does not open eyes or follow commands.  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  I suspect his current encephalopathy is hepatic encephalopathy, will need to precipitate stooling.   Recommendations: 1) continue Keppra 2) increase lactulose to QID and give enema.  3) we will continue to follow  Ritta Slot, MD [facility] [phone]  If 7pm- 7am, please page neurology on call as listed in AMION.

## 2015-03-12 NOTE — Progress Notes (Signed)
Trauma MD [place] paged and spoke with regarding current HR and BP (135-145, 166/83). Orders received for prn lopressor  q4hr for HR greater than 140bpm. Will continue to monitor closely.  Modena Jansky RN [address] [place]

## 2015-03-12 NOTE — Plan of Care (Signed)
Problem: Activity: Goal: Ability to tolerate increased activity will improve Outcome: Not Progressing Pt remains unresponsive at this time. Goal: Mobility will improve Outcome: Not Progressing See previous  Problem: Bowel/Gastric: Goal: Gastrointestinal status for postoperative course will improve Outcome: Progressing Small BM 03/12/2015  Problem: Education: Goal: Will regain or maintain usual level of consciousness Outcome: Not Progressing See previous  Problem: Nutrition: Goal: Ability to attain and maintain optimal nutritional status will improve Outcome: Not Progressing TF stopped for abdominal distention and high residual.

## 2015-03-13 ENCOUNTER — Inpatient Hospital Stay (HOSPITAL_COMMUNITY): Payer: BLUE CROSS/BLUE SHIELD

## 2015-03-13 DIAGNOSIS — J9600 Acute respiratory failure, unspecified whether with hypoxia or hypercapnia: Secondary | ICD-10-CM | POA: Diagnosis present

## 2015-03-13 DIAGNOSIS — S3681XA Injury of peritoneum, initial encounter: Secondary | ICD-10-CM | POA: Diagnosis present

## 2015-03-13 DIAGNOSIS — S36899A Unspecified injury of other intra-abdominal organs, initial encounter: Secondary | ICD-10-CM | POA: Diagnosis present

## 2015-03-13 DIAGNOSIS — D62 Acute posthemorrhagic anemia: Secondary | ICD-10-CM | POA: Diagnosis not present

## 2015-03-13 LAB — GLUCOSE, CAPILLARY
GLUCOSE-CAPILLARY: 176 mg/dL — AB (ref 65–99)
GLUCOSE-CAPILLARY: 180 mg/dL — AB (ref 65–99)
GLUCOSE-CAPILLARY: 190 mg/dL — AB (ref 65–99)
Glucose-Capillary: 162 mg/dL — ABNORMAL HIGH (ref 65–99)
Glucose-Capillary: 186 mg/dL — ABNORMAL HIGH (ref 65–99)
Glucose-Capillary: 187 mg/dL — ABNORMAL HIGH (ref 65–99)

## 2015-03-13 LAB — BASIC METABOLIC PANEL
Anion gap: 7 (ref 5–15)
BUN: 20 mg/dL (ref 6–20)
CALCIUM: 8.5 mg/dL — AB (ref 8.9–10.3)
CO2: 33 mmol/L — ABNORMAL HIGH (ref 22–32)
CREATININE: 0.72 mg/dL (ref 0.61–1.24)
Chloride: 114 mmol/L — ABNORMAL HIGH (ref 101–111)
GFR calc non Af Amer: >60 mL/min (ref >60)
Glucose, Bld: 209 mg/dL — ABNORMAL HIGH (ref 65–99)
Potassium: 3.8 mmol/L (ref 3.5–5.1)
SODIUM: 154 mmol/L — AB (ref 135–145)

## 2015-03-13 LAB — AMMONIA: AMMONIA: 87 umol/L — AB (ref 9–35)

## 2015-03-13 LAB — CBC
HCT: 31.3 % — ABNORMAL LOW (ref 39.0–52.0)
Hemoglobin: 9.6 g/dL — ABNORMAL LOW (ref 13.0–17.0)
MCH: 33.9 pg (ref 26.0–34.0)
MCHC: 30.7 g/dL (ref 30.0–36.0)
MCV: 110.6 fL — ABNORMAL HIGH (ref 78.0–100.0)
Platelets: 98 10*3/uL — ABNORMAL LOW (ref 150–400)
RBC: 2.83 MIL/uL — ABNORMAL LOW (ref 4.22–5.81)
RDW: 20.9 % — AB (ref 11.5–15.5)
WBC: 4.4 10*3/uL (ref 4.0–10.5)

## 2015-03-13 MED ORDER — INSULIN GLARGINE 100 UNIT/ML ~~LOC~~ SOLN
20.0000 [IU] | Freq: Every day | SUBCUTANEOUS | Status: DC
  Administered 2015-03-13: 20 [IU] via SUBCUTANEOUS
  Filled 2015-03-13 (×2): qty 0.2

## 2015-03-13 MED ORDER — CHLORHEXIDINE GLUCONATE 0.12% ORAL RINSE (MEDLINE KIT)
15.0000 mL | Freq: Two times a day (BID) | OROMUCOSAL | Status: DC
  Administered 2015-03-13 – 2015-04-09 (×52): 15 mL via OROMUCOSAL

## 2015-03-13 MED ORDER — ANTISEPTIC ORAL RINSE SOLUTION (CORINZ)
7.0000 mL | Freq: Four times a day (QID) | OROMUCOSAL | Status: DC
  Administered 2015-03-13 – 2015-04-11 (×109): 7 mL via OROMUCOSAL

## 2015-03-13 MED ORDER — FREE WATER
300.0000 mL | Freq: Four times a day (QID) | Status: DC
  Administered 2015-03-13 – 2015-03-17 (×18): 300 mL

## 2015-03-13 MED ORDER — METOPROLOL TARTRATE 1 MG/ML IV SOLN
10.0000 mg | Freq: Four times a day (QID) | INTRAVENOUS | Status: DC | PRN
  Administered 2015-03-13 – 2015-04-07 (×17): 10 mg via INTRAVENOUS
  Filled 2015-03-13 (×17): qty 10

## 2015-03-13 NOTE — Progress Notes (Signed)
Subjective: Has been stooling  Exam: Filed Vitals:   03/13/15 1300 03/13/15 1400  BP: 125/73 123/74  Pulse: 107 110  Temp:    Resp: 16 19   Gen: In bed, intubated Resp: ventilated Abd: soft, nt  Sedation was stopped several hours prior to exam Neuro: MS: does not open eyes or follow commands.  WG:NFAO are slightly upwards looking, dysconjugate(unchanged)PERRL, corneals intact Motor: does not withdraw Sensory: As above  Pertinent Labs: Ammonia down some today.   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  I suspect his current encephalopathy is hepatic encephalopathy, would consider GI consultation.   Recommendations: 1) continue Keppra 2) consider GI consult for hepatic encephalopathy.  3) we will continue to follow  Ritta Slot, MD [facility] [phone]  If 7pm- 7am, please page neurology on call as listed in AMION.
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
## 2015-03-14 ENCOUNTER — Inpatient Hospital Stay (HOSPITAL_COMMUNITY): Payer: BLUE CROSS/BLUE SHIELD

## 2015-03-14 DIAGNOSIS — G9340 Encephalopathy, unspecified: Secondary | ICD-10-CM

## 2015-03-14 LAB — POCT I-STAT 3, ART BLOOD GAS (G3+)
Acid-Base Excess: 5 mmol/L — ABNORMAL HIGH (ref 0.0–2.0)
Acid-Base Excess: 5 mmol/L — ABNORMAL HIGH (ref 0.0–2.0)
BICARBONATE: 29 meq/L — AB (ref 20.0–24.0)
BICARBONATE: 29.2 meq/L — AB (ref 20.0–24.0)
O2 Saturation: 94 %
O2 Saturation: 96 %
TCO2: 30 mmol/L (ref 0–100)
TCO2: 30 mmol/L (ref 0–100)
pCO2 arterial: 40.5 mmHg (ref 35.0–45.0)
pCO2 arterial: 38.4 mmHg (ref 35.0–45.0)
pH, Arterial: 7.467 — ABNORMAL HIGH (ref 7.350–7.450)
pH, Arterial: 7.489 — ABNORMAL HIGH (ref 7.350–7.450)
pO2, Arterial: 72 mmHg — ABNORMAL LOW (ref 80.0–100.0)
pO2, Arterial: 74 mmHg — ABNORMAL LOW (ref 80.0–100.0)

## 2015-03-14 LAB — BASIC METABOLIC PANEL WITH GFR
Anion gap: 7 (ref 5–15)
BUN: 22 mg/dL — ABNORMAL HIGH (ref 6–20)
CO2: 31 mmol/L (ref 22–32)
Calcium: 8.5 mg/dL — ABNORMAL LOW (ref 8.9–10.3)
Chloride: 115 mmol/L — ABNORMAL HIGH (ref 101–111)
Creatinine, Ser: 0.71 mg/dL (ref 0.61–1.24)
GFR calc Af Amer: >60 mL/min
GFR calc non Af Amer: >60 mL/min
Glucose, Bld: 226 mg/dL — ABNORMAL HIGH (ref 65–99)
Potassium: 3.8 mmol/L (ref 3.5–5.1)
Sodium: 153 mmol/L — ABNORMAL HIGH (ref 135–145)

## 2015-03-14 LAB — GLUCOSE, CAPILLARY
GLUCOSE-CAPILLARY: 225 mg/dL — AB (ref 65–99)
Glucose-Capillary: 177 mg/dL — ABNORMAL HIGH (ref 65–99)
Glucose-Capillary: 208 mg/dL — ABNORMAL HIGH (ref 65–99)
Glucose-Capillary: 215 mg/dL — ABNORMAL HIGH (ref 65–99)
Glucose-Capillary: 222 mg/dL — ABNORMAL HIGH (ref 65–99)
Glucose-Capillary: 234 mg/dL — ABNORMAL HIGH (ref 65–99)

## 2015-03-14 LAB — CBC
HCT: 31.6 % — ABNORMAL LOW (ref 39.0–52.0)
Hemoglobin: 9.6 g/dL — ABNORMAL LOW (ref 13.0–17.0)
MCH: 33.7 pg (ref 26.0–34.0)
MCHC: 30.4 g/dL (ref 30.0–36.0)
MCV: 110.9 fL — ABNORMAL HIGH (ref 78.0–100.0)
Platelets: 116 K/uL — ABNORMAL LOW (ref 150–400)
RBC: 2.85 MIL/uL — ABNORMAL LOW (ref 4.22–5.81)
RDW: 21.2 % — ABNORMAL HIGH (ref 11.5–15.5)
WBC: 4.2 K/uL (ref 4.0–10.5)

## 2015-03-14 LAB — AMMONIA: Ammonia: 84 umol/L — ABNORMAL HIGH (ref 9–35)

## 2015-03-14 MED ORDER — DEXTROSE 5 % IV SOLN
1.0000 g | INTRAVENOUS | Status: AC
  Administered 2015-03-14 – 2015-03-16 (×3): 1 g via INTRAVENOUS
  Filled 2015-03-14 (×3): qty 10

## 2015-03-14 MED ORDER — FUROSEMIDE 10 MG/ML IJ SOLN
40.0000 mg | Freq: Once | INTRAMUSCULAR | Status: AC
  Administered 2015-03-14: 40 mg via INTRAVENOUS
  Filled 2015-03-14: qty 4

## 2015-03-14 MED ORDER — INSULIN GLARGINE 100 UNIT/ML ~~LOC~~ SOLN
20.0000 [IU] | Freq: Two times a day (BID) | SUBCUTANEOUS | Status: DC
  Administered 2015-03-14 – 2015-03-22 (×18): 20 [IU] via SUBCUTANEOUS
  Filled 2015-03-14 (×20): qty 0.2

## 2015-03-14 NOTE — Progress Notes (Signed)
EEG completed; results pending.    

## 2015-03-14 NOTE — Procedures (Signed)
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  There is no posterior dominant rhythm.  There is a 2-3 hz delta seen diffusely throughout all head regions.  ACTIVATION:  No changes in background with noxious stimuli noted  EPILEPTIFORM ACTIVITY:  There were no spikes, sharp waves or paroxysmal activity.   IMPRESSION:  This is an abnormal EEG demonstrating a severe diffuse slowing of electrocerebral activity.  This can be seen in a wide variety of encephalopathic state including those of a toxic, metabolic, or degenerative nature.  There were no focal, hemispheric, or lateralizing features.  No epileptiform activity was recorded.

## 2015-03-14 NOTE — Progress Notes (Signed)
ETT holder replaced with no complications.  No breakdown noted.  ETT 7.5 @ 25 lip

## 2015-03-14 NOTE — Progress Notes (Signed)
Subjective: No changes, remains unresponsive, off sedation  Exam: Filed Vitals:   03/14/15 0734 03/14/15 0800  BP:  122/73  Pulse:  108  Temp: 100.5 F (38.1 C)   Resp:  22   Gen: In bed, intubated Resp: ventilated Abd: soft, nt   Neuro: MS: does not open eyes or follow commands.  EA:VWUJ are slightly upwards looking, dysconjugate(unchanged)PERRL, corneals intact Motor: does not withdraw Sensory: As above  Pertinent Labs: Ammonia down some today.   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  I suspect his current encephalopathy is hepatic encephalopathy, ammonia slowly trending down  Recommendations: 1) continue Keppra 2) could consider GI consult for hepatic encephalopathy.  3) repeat EEG  Elspeth Cho, DO [facility] [phone]  If 7pm- 7am, please page neurology on call as listed in AMION.  If 7pm- 7am, please page neurology on call as listed in AMION.

## 2015-03-14 NOTE — Progress Notes (Signed)
Decreased RR per MD

## 2015-03-14 NOTE — Progress Notes (Signed)
Follow up - Trauma and Critical Care  Patient Details:    Ricky Molina. is an 50 y.o. male.  Lines/tubes : Airway 7.5 mm (Active)  Secured at (cm) 25 cm 03/14/2015  4:00 AM  Measured From Lips 03/14/2015  4:00 AM  Secured Location Left 03/14/2015  4:00 AM  Secured By [employer] 03/14/2015  4:00 AM  Tube Holder Repositioned Yes 03/14/2015  4:00 AM  Cuff Pressure (cm H2O) 26 cm H2O 03/13/2015  3:09 PM  Site Condition Dry 03/14/2015  4:00 AM     PICC Triple Lumen 03/07/15 PICC Right Basilic 48 cm 0 cm (Active)  Indication for Insertion or Continuance of Line Prolonged intravenous therapies 03/13/2015  8:00 PM  Exposed Catheter (cm) 0 cm 03/07/2015 11:20 AM  Site Assessment Clean;Dry;Intact 03/13/2015  8:00 PM  Lumen #1 Status Infusing 03/13/2015  8:00 PM  Lumen #2 Status Flushed;Saline locked 03/13/2015  8:00 PM  Lumen #3 Status Flushed;Saline locked 03/13/2015  8:00 PM  Dressing Type Transparent;Occlusive 03/13/2015  8:00 PM  Dressing Status Clean;Dry;Intact;Antimicrobial disc in place 03/13/2015  8:00 PM  Line Care Connections checked and tightened 03/13/2015  8:00 PM  Dressing Change Due 03/14/15 03/13/2015  8:00 PM     NG/OG Tube Nasogastric 16 Fr. Right nare (Active)  Placement Verification Auscultation 03/14/2015  4:00 AM  Site Assessment Clean;Dry;Intact 03/14/2015  4:00 AM  Status Irrigated;Infusing tube feed 03/14/2015  4:00 AM  Drainage Appearance Tan 03/14/2015  4:00 AM  Intake (mL) 200 mL 03/13/2015  9:00 PM  Output (mL) 0 mL 03/14/2015  5:00 AM     Rectal Tube/Pouch (Active)  Output (mL) 200 mL 03/14/2015  5:00 AM     Urethral Catheter dimattia,v. RN Non-latex 14 Fr. (Active)  Indication for Insertion or Continuance of Catheter Other (comment) 03/14/2015  4:00 AM  Site Assessment Clean;Intact 03/14/2015  4:00 AM  Catheter Maintenance Bag below level of bladder;Catheter secured;Drainage bag/tubing not touching floor;Insertion date on drainage bag;No dependent loops;Seal intact 03/14/2015  4:00 AM   Collection Container Standard drainage bag 03/14/2015  4:00 AM  Securement Method Leg strap 03/14/2015  4:00 AM  Urinary Catheter Interventions Unclamped 03/14/2015  4:00 AM  Output (mL) 50 mL 03/14/2015  6:00 AM    Microbiology/Sepsis markers: Results for orders placed or performed during the hospital encounter of 03/08/2015  MRSA PCR Screening     Status: None   Collection Time: 03/06/15  5:23 AM  Result Value Ref Range Status   MRSA by PCR NEGATIVE NEGATIVE Final    Comment:        The GeneXpert MRSA Assay (FDA approved for NASAL specimens only), is one component of a comprehensive MRSA colonization surveillance program. It is not intended to diagnose MRSA infection nor to guide or monitor treatment for MRSA infections.   Culture, respiratory (NON-Expectorated)     Status: None   Collection Time: 03/09/15 11:22 AM  Result Value Ref Range Status   Specimen Description TRACHEAL ASPIRATE  Final   Special Requests Normal  Final   Gram Stain   Final    ABUNDANT WBC PRESENT, PREDOMINANTLY PMN FEW SQUAMOUS EPITHELIAL CELLS PRESENT FEW GRAM POSITIVE COCCI IN PAIRS Performed at [facility]    Culture   Final    ABUNDANT GROUP B STREP(S.AGALACTIAE)ISOLATED Performed at [facility]    Report Status 03/11/2015 FINAL  Final    Anti-infectives:  Anti-infectives    Start     Dose/Rate Route Frequency Ordered Stop  03/12/15 0900  cefTRIAXone (ROCEPHIN) injection 1 g     1 g Intramuscular Every 24 hours 03/12/15 0851 03/17/15 0859   03/09/15 1030  piperacillin-tazobactam (ZOSYN) IVPB 3.375 g  Status:  Discontinued     3.375 g 12.5 mL/hr over 240 Minutes Intravenous Every 8 hours 03/09/15 1000 03/12/15 0851      Best Practice/Protocols:  VTE Prophylaxis: Mechanical GI Prophylaxis: Proton Pump Inhibitor Patient is on no pressors and is not getting any sedation.  Consults: Treatment Team:  Kym Groom, MD    Events:  Subjective:    Overnight  Issues: No report of any issues overnight.  Objective:  Vital signs for last 24 hours: Temp:  [99 F (37.2 C)-101.4 F (38.6 C)] 100.5 F (38.1 C) (02/06 0734) Pulse Rate:  [103-143] 106 (02/06 0700) Resp:  [16-28] 22 (02/06 0700) BP: (116-172)/(64-97) 116/71 mmHg (02/06 0700) SpO2:  [88 %-99 %] 91 % (02/06 0700) FiO2 (%):  [40 %] 40 % (02/06 0400) Weight:  [126.6 kg (279 lb 1.6 oz)] 126.6 kg (279 lb 1.6 oz) (02/06 0500)  Hemodynamic parameters for last 24 hours:    Intake/Output from previous day: 02/05 0701 - 02/06 0700 In: 3035 [I.V.:1200; NG/GT:1630; IV Piggyback:205] Out: 2948 [Urine:1745; Stool:1203]  Intake/Output this shift:    Vent settings for last 24 hours: Vent Mode:  [-] PRVC FiO2 (%):  [40 %] 40 % Set Rate:  [16 bmp] 16 bmp Vt Set:  [560 mL] 560 mL PEEP:  [5 cmH20] 5 cmH20 Plateau Pressure:  [19 cmH20-23 cmH20] 21 cmH20  Physical Exam:  General: Comatose and encephalopathic Neuro: RASS -3 or deeper Resp: clear to auscultation bilaterally CVS: regular rate and rhythm, S1, S2 normal, no murmur, click, rub or gallop and Sinus tachycardia GI: distended, hypoactive BS, wound clean and not tense.  staples are intact. Extremities: edema 2+ and still very edematous, but maybe slightly less today.  Good pulses bilaterally.  Results for orders placed or performed during the hospital encounter of 2015-03-13 (from the past 24 hour(s))  Glucose, capillary     Status: Abnormal   Collection Time: 03/13/15  8:19 AM  Result Value Ref Range   Glucose-Capillary 186 (H) 65 - 99 mg/dL   Comment 1 Venous Specimen   Glucose, capillary     Status: Abnormal   Collection Time: 03/13/15 11:32 AM  Result Value Ref Range   Glucose-Capillary 180 (H) 65 - 99 mg/dL   Comment 1 Capillary Specimen   Glucose, capillary     Status: Abnormal   Collection Time: 03/13/15  3:57 PM  Result Value Ref Range   Glucose-Capillary 190 (H) 65 - 99 mg/dL   Comment 1 Notify RN   Glucose,  capillary     Status: Abnormal   Collection Time: 03/13/15  8:20 PM  Result Value Ref Range   Glucose-Capillary 187 (H) 65 - 99 mg/dL   Comment 1 Notify RN    Comment 2 Document in Chart   Glucose, capillary     Status: Abnormal   Collection Time: 03/14/15 12:28 AM  Result Value Ref Range   Glucose-Capillary 177 (H) 65 - 99 mg/dL   Comment 1 Notify RN    Comment 2 Document in Chart   CBC     Status: Abnormal   Collection Time: 03/14/15  4:15 AM  Result Value Ref Range   WBC 4.2 4.0 - 10.5 K/uL   RBC 2.85 (L) 4.22 - 5.81 MIL/uL   Hemoglobin 9.6 (L) 13.0 - 17.0  g/dL   HCT 40.9 (L) 81.1 - 91.4 %   MCV 110.9 (H) 78.0 - 100.0 fL   MCH 33.7 26.0 - 34.0 pg   MCHC 30.4 30.0 - 36.0 g/dL   RDW 78.2 (H) 95.6 - 21.3 %   Platelets 116 (L) 150 - 400 K/uL  Basic metabolic panel     Status: Abnormal   Collection Time: 03/14/15  4:15 AM  Result Value Ref Range   Sodium 153 (H) 135 - 145 mmol/L   Potassium 3.8 3.5 - 5.1 mmol/L   Chloride 115 (H) 101 - 111 mmol/L   CO2 31 22 - 32 mmol/L   Glucose, Bld 226 (H) 65 - 99 mg/dL   BUN 22 (H) 6 - 20 mg/dL   Creatinine, Ser 0.86 0.61 - 1.24 mg/dL   Calcium 8.5 (L) 8.9 - 10.3 mg/dL   GFR calc non Af Amer >60 >60 mL/min   GFR calc Af Amer >60 >60 mL/min   Anion gap 7 5 - 15  Ammonia     Status: Abnormal   Collection Time: 03/14/15  4:30 AM  Result Value Ref Range   Ammonia 84 (H) 9 - 35 umol/L     Assessment/Plan:   NEURO  Altered Mental Status:  coma and encephalopathy   Plan: Not awakening, ammonia 87 and steady.  Hoping to possible extubate soon.  PULM  Atelectasis/collapse (focal and RML)   Plan: Continue treatment for Strept pneumonia.  Check blood gas  CARDIO  Sinus Tachycardia   Plan: No specific treatment  RENAL  Urione o utput and renal function are good.   Plan: Continue diuresis  GI  No specific issues   Plan: Continue tube feedings.  ID  Pneumonia (hospital acquired (not ventilator-associated) Strept)   Plan: Continue  Rocephin  HEME  Anemia acute blood loss anemia and anemia of critical illness) Thrombocytopenia (etiology unknown and probabaly from cirrhosis, platelets are improving.)   Plan: No blood or platelets currerntly  ENDO Hyperglycemia (due to hepatic failure, stress related and cirrh9osis)   Plan: Increase the Lantus coverage  Global Issues  Patient not weaning and sats are lower than previously.  Will try more Lasix and continue to treat the pneumonia.  Control glucose better with bid Lantus.  Continue nutrition, but may look towards tracheostomy later this week.    LOS: 9 days   Additional comments:I reviewed the patient's new clinical lab test results. cbc/bmet and I reviewed the patients new imaging test results. cxr  Critical Care Total Time*: 30 Minutes  Merit Gadsby 03/14/2015  *Care during the described time interval was provided by me and/or other providers on the critical care team.  I have reviewed this patient's available data, including medical history, events of note, physical examination and test results as part of my evaluation.

## 2015-03-15 LAB — BASIC METABOLIC PANEL
ANION GAP: 9 (ref 5–15)
BUN: 24 mg/dL — ABNORMAL HIGH (ref 6–20)
CO2: 28 mmol/L (ref 22–32)
Calcium: 8.6 mg/dL — ABNORMAL LOW (ref 8.9–10.3)
Chloride: 114 mmol/L — ABNORMAL HIGH (ref 101–111)
Creatinine, Ser: 0.74 mg/dL (ref 0.61–1.24)
GFR calc non Af Amer: >60 mL/min (ref >60)
Glucose, Bld: 217 mg/dL — ABNORMAL HIGH (ref 65–99)
Potassium: 3.5 mmol/L (ref 3.5–5.1)
Sodium: 151 mmol/L — ABNORMAL HIGH (ref 135–145)

## 2015-03-15 LAB — URINALYSIS, ROUTINE W REFLEX MICROSCOPIC
Glucose, UA: NEGATIVE mg/dL
Hgb urine dipstick: NEGATIVE
Ketones, ur: NEGATIVE mg/dL
Leukocytes, UA: NEGATIVE
NITRITE: NEGATIVE
Protein, ur: NEGATIVE mg/dL
SPECIFIC GRAVITY, URINE: 1.028 (ref 1.005–1.030)
pH: 6 (ref 5.0–8.0)

## 2015-03-15 LAB — HEPATIC FUNCTION PANEL
ALT: 131 U/L — ABNORMAL HIGH (ref 17–63)
AST: 143 U/L — ABNORMAL HIGH (ref 15–41)
Albumin: 2.2 g/dL — ABNORMAL LOW (ref 3.5–5.0)
Alkaline Phosphatase: 91 U/L (ref 38–126)
BILIRUBIN DIRECT: 3.1 mg/dL — AB (ref 0.1–0.5)
BILIRUBIN INDIRECT: 2.7 mg/dL — AB (ref 0.3–0.9)
Total Bilirubin: 5.8 mg/dL — ABNORMAL HIGH (ref 0.3–1.2)
Total Protein: 6.1 g/dL — ABNORMAL LOW (ref 6.5–8.1)

## 2015-03-15 LAB — BLOOD GAS, ARTERIAL
Acid-Base Excess: 4.1 mmol/L — ABNORMAL HIGH (ref 0.0–2.0)
BICARBONATE: 27.7 meq/L — AB (ref 20.0–24.0)
Drawn by: 331761
FIO2: 0.6
LHR: 18 {breaths}/min
O2 Saturation: 95.9 %
PEEP/CPAP: 8 cmH2O
PO2 ART: 81.4 mmHg (ref 80.0–100.0)
Patient temperature: 98.6
Pressure control: 16 cmH2O
TCO2: 28.9 mmol/L (ref 0–100)
pCO2 arterial: 38.7 mmHg (ref 35.0–45.0)
pH, Arterial: 7.468 — ABNORMAL HIGH (ref 7.350–7.450)

## 2015-03-15 LAB — GLUCOSE, CAPILLARY
GLUCOSE-CAPILLARY: 252 mg/dL — AB (ref 65–99)
GLUCOSE-CAPILLARY: 217 mg/dL — AB (ref 65–99)
GLUCOSE-CAPILLARY: 231 mg/dL — AB (ref 65–99)
Glucose-Capillary: 170 mg/dL — ABNORMAL HIGH (ref 65–99)
Glucose-Capillary: 186 mg/dL — ABNORMAL HIGH (ref 65–99)
Glucose-Capillary: 195 mg/dL — ABNORMAL HIGH (ref 65–99)
Glucose-Capillary: 196 mg/dL — ABNORMAL HIGH (ref 65–99)

## 2015-03-15 MED ORDER — FUROSEMIDE 10 MG/ML IJ SOLN
40.0000 mg | Freq: Two times a day (BID) | INTRAMUSCULAR | Status: AC
  Administered 2015-03-15 (×2): 40 mg via INTRAVENOUS
  Filled 2015-03-15 (×2): qty 4

## 2015-03-15 MED ORDER — MIDAZOLAM HCL 2 MG/2ML IJ SOLN
2.0000 mg | INTRAMUSCULAR | Status: DC | PRN
  Administered 2015-03-16 – 2015-04-01 (×14): 2 mg via INTRAVENOUS
  Administered 2015-04-01: 1 mg via INTRAVENOUS
  Administered 2015-04-02 – 2015-04-09 (×19): 2 mg via INTRAVENOUS
  Filled 2015-03-15 (×38): qty 2

## 2015-03-15 MED ORDER — FENTANYL CITRATE (PF) 100 MCG/2ML IJ SOLN
50.0000 ug | INTRAMUSCULAR | Status: DC | PRN
  Administered 2015-03-16 – 2015-03-19 (×19): 50 ug via INTRAVENOUS
  Filled 2015-03-15 (×17): qty 2

## 2015-03-15 NOTE — Progress Notes (Signed)
Follow up - Trauma and Critical Care  Patient Details:    Ricky Molina. is an 50 y.o. male.  Lines/tubes : Airway 7.5 mm (Active)  Secured at (cm) 25 cm 03/15/2015  3:42 AM  Measured From Lips 03/15/2015  3:42 AM  Secured Location Center 03/15/2015  3:42 AM  Secured By [employer] 03/15/2015  3:42 AM  Tube Holder Repositioned Yes 03/14/2015  4:05 PM  Cuff Pressure (cm H2O) 22 cm H2O 03/15/2015  3:42 AM  Site Condition Dry 03/15/2015  3:42 AM     PICC Triple Lumen 03/07/15 PICC Right Basilic 48 cm 0 cm (Active)  Indication for Insertion or Continuance of Line Prolonged intravenous therapies 03/14/2015  8:00 PM  Exposed Catheter (cm) 0 cm 03/07/2015 11:20 AM  Site Assessment Clean;Dry;Intact 03/14/2015  8:00 PM  Lumen #1 Status Infusing 03/14/2015  8:00 PM  Lumen #2 Status Flushed;Capped (Central line) 03/14/2015  8:00 PM  Lumen #3 Status Flushed;Capped (Central line) 03/14/2015  8:00 PM  Dressing Type Transparent;Occlusive 03/14/2015  8:00 PM  Dressing Status Clean;Dry;Intact;Antimicrobial disc in place 03/14/2015  8:00 PM  Line Care Connections checked and tightened 03/14/2015  8:00 PM  Dressing Change Due 03/21/15 03/14/2015  8:00 PM     NG/OG Tube Nasogastric 16 Fr. Right nare (Active)  Placement Verification Auscultation 03/14/2015  8:00 PM  Site Assessment Clean;Dry;Intact 03/14/2015  8:00 PM  Status Infusing tube feed 03/14/2015  8:00 PM  Drainage Appearance Tan 03/14/2015  4:00 AM  Intake (mL) 300 mL 03/14/2015  6:00 PM  Output (mL) 0 mL 03/14/2015  4:00 PM     Rectal Tube/Pouch (Active)  Output (mL) 100 mL 03/15/2015  5:00 AM     Urethral Catheter dimattia,v. RN Non-latex 14 Fr. (Active)  Indication for Insertion or Continuance of Catheter Other (comment) 03/14/2015  8:00 PM  Site Assessment Clean;Intact 03/14/2015  8:00 PM  Catheter Maintenance Bag below level of bladder;Insertion date on drainage bag;Bag emptied prior to transport;No dependent loops;Catheter secured;Drainage bag/tubing not touching  floor;Seal intact 03/14/2015  8:00 PM  Collection Container Standard drainage bag 03/14/2015  8:00 PM  Securement Method Leg strap 03/14/2015  8:00 PM  Urinary Catheter Interventions Unclamped 03/14/2015  8:00 PM  Output (mL) 75 mL 03/15/2015  6:00 AM    Microbiology/Sepsis markers: Results for orders placed or performed during the hospital encounter of 2015/03/17  MRSA PCR Screening     Status: None   Collection Time: 03/06/15  5:23 AM  Result Value Ref Range Status   MRSA by PCR NEGATIVE NEGATIVE Final    Comment:        The GeneXpert MRSA Assay (FDA approved for NASAL specimens only), is one component of a comprehensive MRSA colonization surveillance program. It is not intended to diagnose MRSA infection nor to guide or monitor treatment for MRSA infections.   Culture, respiratory (NON-Expectorated)     Status: None   Collection Time: 03/09/15 11:22 AM  Result Value Ref Range Status   Specimen Description TRACHEAL ASPIRATE  Final   Special Requests Normal  Final   Gram Stain   Final    ABUNDANT WBC PRESENT, PREDOMINANTLY PMN FEW SQUAMOUS EPITHELIAL CELLS PRESENT FEW GRAM POSITIVE COCCI IN PAIRS Performed at [facility]    Culture   Final    ABUNDANT GROUP B STREP(S.AGALACTIAE)ISOLATED Performed at [facility]    Report Status 03/11/2015 FINAL  Final    Anti-infectives:  Anti-infectives    Start  Dose/Rate Route Frequency Ordered Stop   03/14/15 0900  cefTRIAXone (ROCEPHIN) 1 g in dextrose 5 % 50 mL IVPB     1 g 100 mL/hr over 30 Minutes Intravenous Every 24 hours 03/14/15 0835 03/17/15 0859   03/12/15 0900  cefTRIAXone (ROCEPHIN) injection 1 g  Status:  Discontinued     1 g Intramuscular Every 24 hours 03/12/15 0851 03/14/15 0835   03/09/15 1030  piperacillin-tazobactam (ZOSYN) IVPB 3.375 g  Status:  Discontinued     3.375 g 12.5 mL/hr over 240 Minutes Intravenous Every 8 hours 03/09/15 1000 03/12/15 0851      Best Practice/Protocols:  VTE  Prophylaxis: Mechanical GI Prophylaxis: Proton Pump Inhibitor No sedation whatsoever.  Consults:      Events:  Subjective:    Overnight Issues: No new issues overnight.  Has nto awakened.  Objective:  Vital signs for last 24 hours: Temp:  [98.6 F (37 C)-101.3 F (38.5 C)] 98.6 F (37 C) (02/07 0400) Pulse Rate:  [93-121] 121 (02/07 0600) Resp:  [18-31] 23 (02/07 0600) BP: (122-167)/(73-91) 145/90 mmHg (02/07 0600) SpO2:  [91 %-95 %] 94 % (02/07 0600) FiO2 (%):  [40 %-60 %] 60 % (02/07 0400) Weight:  [126 kg (277 lb 12.5 oz)] 126 kg (277 lb 12.5 oz) (02/07 0500)  Hemodynamic parameters for last 24 hours:    Intake/Output from previous day: 02/06 0701 - 02/07 0700 In: 3385 [I.V.:1050; NG/GT:2075; IV Piggyback:260] Out: 4120 [Urine:2510; Emesis/NG output:10; Stool:1600]  Intake/Output this shift:    Vent settings for last 24 hours: Vent Mode:  [-] PCV FiO2 (%):  [40 %-60 %] 60 % Set Rate:  [16 bmp-20 bmp] 18 bmp Vt Set:  [560 mL] 560 mL PEEP:  [5 cmH20-8 cmH20] 8 cmH20 Pressure Support:  [10 cmH20] 10 cmH20 Plateau Pressure:  [14 cmH20-24 cmH20] 24 cmH20  Physical Exam:  General: no respiratory distress and On pressure control ventilation Neuro: RASS -3 or deeper Resp: clear to auscultation bilaterally CVS: regular rate and rhythm, S1, S2 normal, no murmur, click, rub or gallop and sinus tachycardia improved. GI: soft, nontender, BS WNL, no r/g, distended and wound clean Extremities: edema 2+ and still good pulses bilaterally  Results for orders placed or performed during the hospital encounter of 03/10/15 (from the past 24 hour(s))  Glucose, capillary     Status: Abnormal   Collection Time: 03/14/15  7:31 AM  Result Value Ref Range   Glucose-Capillary 208 (H) 65 - 99 mg/dL   Comment 1 Capillary Specimen    Comment 2 Notify RN   I-STAT 3, arterial blood gas (G3+)     Status: Abnormal   Collection Time: 03/14/15  8:51 AM  Result Value Ref Range   pH,  Arterial 7.467 (H) 7.350 - 7.450   pCO2 arterial 40.5 35.0 - 45.0 mmHg   pO2, Arterial 72.0 (L) 80.0 - 100.0 mmHg   Bicarbonate 29.0 (H) 20.0 - 24.0 mEq/L   TCO2 30 0 - 100 mmol/L   O2 Saturation 94.0 %   Acid-Base Excess 5.0 (H) 0.0 - 2.0 mmol/L   Patient temperature 100.5 F    Collection site RADIAL, ALLEN'S TEST ACCEPTABLE    Drawn by RT    Sample type ARTERIAL   I-STAT 3, arterial blood gas (G3+)     Status: Abnormal   Collection Time: 03/14/15 11:52 AM  Result Value Ref Range   pH, Arterial 7.489 (H) 7.350 - 7.450   pCO2 arterial 38.4 35.0 - 45.0 mmHg  pO2, Arterial 74.0 (L) 80.0 - 100.0 mmHg   Bicarbonate 29.2 (H) 20.0 - 24.0 mEq/L   TCO2 30 0 - 100 mmol/L   O2 Saturation 96.0 %   Acid-Base Excess 5.0 (H) 0.0 - 2.0 mmol/L   Patient temperature 98.6 F    Collection site RADIAL, ALLEN'S TEST ACCEPTABLE    Drawn by RT    Sample type ARTERIAL   Glucose, capillary     Status: Abnormal   Collection Time: 03/14/15 12:09 PM  Result Value Ref Range   Glucose-Capillary 234 (H) 65 - 99 mg/dL   Comment 1 Capillary Specimen    Comment 2 Notify RN   Glucose, capillary     Status: Abnormal   Collection Time: 03/14/15  3:18 PM  Result Value Ref Range   Glucose-Capillary 222 (H) 65 - 99 mg/dL   Comment 1 Capillary Specimen    Comment 2 Notify RN   Glucose, capillary     Status: Abnormal   Collection Time: 03/14/15  8:28 PM  Result Value Ref Range   Glucose-Capillary 225 (H) 65 - 99 mg/dL   Comment 1 Notify RN    Comment 2 Document in Chart   Glucose, capillary     Status: Abnormal   Collection Time: 03/15/15 12:17 AM  Result Value Ref Range   Glucose-Capillary 170 (H) 65 - 99 mg/dL   Comment 1 Notify RN    Comment 2 Document in Chart   Basic metabolic panel     Status: Abnormal   Collection Time: 03/15/15  3:10 AM  Result Value Ref Range   Sodium 151 (H) 135 - 145 mmol/L   Potassium 3.5 3.5 - 5.1 mmol/L   Chloride 114 (H) 101 - 111 mmol/L   CO2 28 22 - 32 mmol/L    Glucose, Bld 217 (H) 65 - 99 mg/dL   BUN 24 (H) 6 - 20 mg/dL   Creatinine, Ser 9.60 0.61 - 1.24 mg/dL   Calcium 8.6 (L) 8.9 - 10.3 mg/dL   GFR calc non Af Amer >60 >60 mL/min   GFR calc Af Amer >60 >60 mL/min   Anion gap 9 5 - 15  Glucose, capillary     Status: Abnormal   Collection Time: 03/15/15  4:19 AM  Result Value Ref Range   Glucose-Capillary 186 (H) 65 - 99 mg/dL   Comment 1 Notify RN    Comment 2 Document in Chart      Assessment/Plan:   NEURO  Altered Mental Status:  coma   Plan: Absolutely no awakening.  Pupils are reactive. No response to noxious stimulus  PULM  Atelectasis/collapse (focal and bibasilar, no CXR today.)   Plan: Continued support.  Now on pressure controlled ventilation, FIO2 up to 60% with PEEP +8 and sats are 96%.  Will check ABG this AM  CARDIO  Ventricular Premature Beats (without hemodynamic compromise) and Sinus Tachycardia   Plan: Continue to monitor  RENAL  Urine output is adequate and renal function is currently normal   Plan: Expect hepatorenal syndrome to settle in unfortunately.  Patient has notable scleral icterus and even though urine output is adequat, BUN starting to rise slightly disproportionately.    GI  No new issues   Plan: Tolerating tube feedings well.  ID  Pneumonia (hospital acquired (not ventilator-associated) Group B Strept pneumonia)   Plan: Continue antibiotics  HEME  Anemia acute blood loss anemia and anemia of critical illness)   Plan: No CBC today, but no ongoing blood loss  noted.  ENDO Hypernatremia (etiology unknown) Hyperglycemia (due to hepatic failure and stress related)   Plan: Will increase Lantus and sliding scale coverage  Global Issues  Patient is sliding away and probably should have a conference with the family soon.  He is neurologically not showing any signs of improvement inspite of lactulose daily.  LFTs from today are pending but I expect liver failure to be significant.  Will check ABG, recheck  ammonia level tomorrow AM.  Plan for family meeting tomorrow. AM.    LOS: 10 days   Additional comments:I reviewed the patient's new clinical lab test results. bmet  Critical Care Total Time*: 30 Minutes  Quida Glasser 03/15/2015  *Care during the described time interval was provided by me and/or other providers on the critical care team.  I have reviewed this patient's available data, including medical history, events of note, physical examination and test results as part of my evaluation.

## 2015-03-15 NOTE — Plan of Care (Signed)
Patient with increased RR and HR, coughing, suctioned several times for thick tan secretions, RT at bedside, lopressor for increased HR. Will continue to monitor.

## 2015-03-15 NOTE — Plan of Care (Signed)
Called wife at home d/t Dr. Lindie Spruce wanting to have a family conference tomorrow.....wife states it is very hard for her to get to the hospital and prefers phone call from MD.
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
## 2015-03-15 NOTE — ED Notes (Signed)
FFP unit number [id number] started at 2008 and ended at 2032, verified with Diona Foley

## 2015-03-15 NOTE — Progress Notes (Signed)
Patient continues to remain unresponsive and doesn't follow any commands. IV metoprolol 10 mg given once near the end of the shift for sinus tach (120's). Patient also had some multfocal PVC's. Bath given.

## 2015-03-16 ENCOUNTER — Inpatient Hospital Stay (HOSPITAL_COMMUNITY): Payer: BLUE CROSS/BLUE SHIELD

## 2015-03-16 LAB — CBC WITH DIFFERENTIAL/PLATELET
BASOS PCT: 0 %
Basophils Absolute: 0 10*3/uL (ref 0.0–0.1)
EOS ABS: 0.1 10*3/uL (ref 0.0–0.7)
Eosinophils Relative: 1 %
HCT: 35 % — ABNORMAL LOW (ref 39.0–52.0)
HEMOGLOBIN: 10.4 g/dL — AB (ref 13.0–17.0)
LYMPHS PCT: 17 %
Lymphs Abs: 1.5 10*3/uL (ref 0.7–4.0)
MCH: 32.9 pg (ref 26.0–34.0)
MCHC: 29.7 g/dL — ABNORMAL LOW (ref 30.0–36.0)
MCV: 110.8 fL — ABNORMAL HIGH (ref 78.0–100.0)
Monocytes Absolute: 0.9 10*3/uL (ref 0.1–1.0)
Monocytes Relative: 10 %
NEUTROS PCT: 72 %
Neutro Abs: 6.5 10*3/uL (ref 1.7–7.7)
Platelets: 188 10*3/uL (ref 150–400)
RBC: 3.16 MIL/uL — AB (ref 4.22–5.81)
RDW: 22.2 % — ABNORMAL HIGH (ref 11.5–15.5)
WBC: 9 10*3/uL (ref 4.0–10.5)

## 2015-03-16 LAB — GLUCOSE, CAPILLARY
GLUCOSE-CAPILLARY: 201 mg/dL — AB (ref 65–99)
GLUCOSE-CAPILLARY: 200 mg/dL — AB (ref 65–99)
GLUCOSE-CAPILLARY: 236 mg/dL — AB (ref 65–99)
Glucose-Capillary: 216 mg/dL — ABNORMAL HIGH (ref 65–99)
Glucose-Capillary: 211 mg/dL — ABNORMAL HIGH (ref 65–99)

## 2015-03-16 LAB — COMPREHENSIVE METABOLIC PANEL
ALK PHOS: 90 U/L (ref 38–126)
ALT: 114 U/L — ABNORMAL HIGH (ref 17–63)
ANION GAP: 7 (ref 5–15)
AST: 157 U/L — ABNORMAL HIGH (ref 15–41)
Albumin: 2.1 g/dL — ABNORMAL LOW (ref 3.5–5.0)
BUN: 22 mg/dL — ABNORMAL HIGH (ref 6–20)
CALCIUM: 8.5 mg/dL — AB (ref 8.9–10.3)
CHLORIDE: 116 mmol/L — AB (ref 101–111)
CO2: 31 mmol/L (ref 22–32)
Creatinine, Ser: 0.74 mg/dL (ref 0.61–1.24)
GFR calc non Af Amer: >60 mL/min (ref >60)
Glucose, Bld: 196 mg/dL — ABNORMAL HIGH (ref 65–99)
Potassium: 3.5 mmol/L (ref 3.5–5.1)
SODIUM: 154 mmol/L — AB (ref 135–145)
Total Bilirubin: 5.9 mg/dL — ABNORMAL HIGH (ref 0.3–1.2)
Total Protein: 6.1 g/dL — ABNORMAL LOW (ref 6.5–8.1)

## 2015-03-16 LAB — POCT I-STAT 3, ART BLOOD GAS (G3+)
Acid-Base Excess: 7 mmol/L — ABNORMAL HIGH (ref 0.0–2.0)
Bicarbonate: 31.4 mEq/L — ABNORMAL HIGH (ref 20.0–24.0)
O2 Saturation: 95 %
TCO2: 33 mmol/L (ref 0–100)
pCO2 arterial: 43.3 mmHg (ref 35.0–45.0)
pH, Arterial: 7.469 — ABNORMAL HIGH (ref 7.350–7.450)
pO2, Arterial: 73 mmHg — ABNORMAL LOW (ref 80.0–100.0)

## 2015-03-16 LAB — AMMONIA: AMMONIA: 69 umol/L — AB (ref 9–35)

## 2015-03-16 MED ORDER — ACETAMINOPHEN 160 MG/5ML PO SOLN
650.0000 mg | Freq: Once | ORAL | Status: AC
  Administered 2015-03-16: 650 mg via ORAL

## 2015-03-16 MED ORDER — VANCOMYCIN HCL 10 G IV SOLR
1500.0000 mg | INTRAVENOUS | Status: DC
  Administered 2015-03-16 – 2015-03-17 (×2): 1500 mg via INTRAVENOUS
  Filled 2015-03-16 (×3): qty 1500

## 2015-03-16 MED ORDER — FUROSEMIDE 10 MG/ML IJ SOLN
40.0000 mg | Freq: Two times a day (BID) | INTRAMUSCULAR | Status: AC
  Administered 2015-03-16 (×2): 40 mg via INTRAVENOUS
  Filled 2015-03-16 (×2): qty 4

## 2015-03-16 NOTE — Progress Notes (Signed)
Inpatient Diabetes Program Recommendations  AACE/ADA: New Consensus Statement on Inpatient Glycemic Control (2015)  Target Ranges:  Prepandial:   less than 140 mg/dL      Peak postprandial:   less than 180 mg/dL (1-2 hours)      Critically ill patients:  140 - 180 mg/dL   Review of Glycemic Control Results for JUJHAR, EVERETT (MRN [id number]) as of 03/16/2015 14:45  Ref. Range 03/15/2015 19:09 03/15/2015 23:27 03/16/2015 03:45 03/16/2015 07:44 03/16/2015 12:29  Glucose-Capillary Latest Ref Range: 65-99 mg/dL 409 (H) 811 (H) 914 (H) 200 (H) 216 (H)   May benefit from additional Novolog for TF coverage.  Inpatient Diabetes Program Recommendations:    Consider addition of Novolog 3 units Q4H for TF coverage.  Will continue to follow. Thank you. Ailene Ards, RD, LDN, CDE Inpatient Diabetes Coordinator [phone]

## 2015-03-16 NOTE — Progress Notes (Addendum)
Pharmacy Antibiotic Note  Ricky Molina. is a 50 y.o. male admitted on 02/12/2015 with pneumonia and bacteremia.  Pharmacy has been consulted for vancomycin dosing.  Plan: Vancomycin 1500 IV every 24 hours.  Goal trough 15-20 mcg/mL.  Height:  (175.3 cm) Weight: 275 lb 5.7 oz (124.9 kg) IBW/kg (Calculated) : 70.7  Temp (24hrs), Avg:100.4 F (38 C), Min:98.4 F (36.9 C), Max:101.6 F (38.7 C)   Recent Labs Lab 03/11/15 0426 03/12/15 0412 03/13/15 0508 03/14/15 0415 03/15/15 0310 03/16/15 0540  WBC 3.7* 4.7 4.4 4.2  --  9.0  CREATININE 0.74 0.67 0.72 0.71 0.74 0.74    Estimated Creatinine Clearance: 146 mL/min (by C-G formula based on Cr of 0.74).    Allergies  Allergen Reactions  . Morphine And Related    Isaac Bliss, PharmD, BCPS, [facility] Clinical Pharmacist Pager [phone] 03/16/2015 7:30 PM

## 2015-03-16 NOTE — Progress Notes (Signed)
Follow up - Trauma and Critical Care  Patient Details:    Ricky Molina. is an 50 y.o. male.  Lines/tubes : Airway 7.5 mm (Active)  Secured at (cm) 25 cm 03/16/2015  8:30 AM  Measured From Lips 03/16/2015  8:30 AM  Secured Location Center 03/16/2015  8:30 AM  Secured By [employer] 03/16/2015  8:30 AM  Tube Holder Repositioned Yes 03/15/2015  7:34 PM  Cuff Pressure (cm H2O) 20 cm H2O 03/16/2015  4:04 AM  Site Condition Dry 03/16/2015  8:30 AM     PICC Triple Lumen 03/07/15 PICC Right Basilic 48 cm 0 cm (Active)  Indication for Insertion or Continuance of Line Prolonged intravenous therapies 03/16/2015  7:33 AM  Exposed Catheter (cm) 0 cm 03/07/2015 11:20 AM  Site Assessment Clean;Dry;Intact 03/16/2015  7:30 AM  Lumen #1 Status Infusing 03/16/2015  7:30 AM  Lumen #2 Status Saline locked 03/16/2015  7:30 AM  Lumen #3 Status Saline locked 03/16/2015  7:30 AM  Dressing Type Transparent;Occlusive 03/16/2015  7:30 AM  Dressing Status Clean;Dry;Intact;Antimicrobial disc in place 03/16/2015  7:30 AM  Line Care Connections checked and tightened 03/16/2015  7:30 AM  Dressing Change Due 03/21/15 03/16/2015  7:30 AM     NG/OG Tube Nasogastric 16 Fr. Right nare (Active)  Placement Verification Auscultation 03/16/2015  7:30 AM  Site Assessment Clean;Dry;Intact 03/16/2015  7:30 AM  Status Infusing tube feed 03/16/2015  7:30 AM  Drainage Appearance Tan 03/14/2015  4:00 AM  Intake (mL) 200 mL 03/15/2015 10:00 PM  Output (mL) 0 mL 03/14/2015  4:00 PM     Rectal Tube/Pouch (Active)  Output (mL) 75 mL 03/16/2015  5:00 AM     Urethral Catheter dimattia,v. RN Non-latex 14 Fr. (Active)  Indication for Insertion or Continuance of Catheter Other (comment) 03/16/2015  7:33 AM  Site Assessment Clean;Intact 03/16/2015  7:30 AM  Catheter Maintenance Bag below level of bladder;Catheter secured;Drainage bag/tubing not touching floor;Insertion date on drainage bag;No dependent loops;Seal intact 03/16/2015  7:34 AM  Collection Container  Standard drainage bag 03/16/2015  7:30 AM  Securement Method Leg strap 03/16/2015  7:30 AM  Urinary Catheter Interventions Unclamped 03/16/2015  7:30 AM  Output (mL) 70 mL 03/16/2015  8:00 AM    Microbiology/Sepsis markers: Results for orders placed or performed during the hospital encounter of Mar 06, 2015  MRSA PCR Screening     Status: None   Collection Time: 03/06/15  5:23 AM  Result Value Ref Range Status   MRSA by PCR NEGATIVE NEGATIVE Final    Comment:        The GeneXpert MRSA Assay (FDA approved for NASAL specimens only), is one component of a comprehensive MRSA colonization surveillance program. It is not intended to diagnose MRSA infection nor to guide or monitor treatment for MRSA infections.   Culture, respiratory (NON-Expectorated)     Status: None   Collection Time: 03/09/15 11:22 AM  Result Value Ref Range Status   Specimen Description TRACHEAL ASPIRATE  Final   Special Requests Normal  Final   Gram Stain   Final    ABUNDANT WBC PRESENT, PREDOMINANTLY PMN FEW SQUAMOUS EPITHELIAL CELLS PRESENT FEW GRAM POSITIVE COCCI IN PAIRS Performed at [facility]    Culture   Final    ABUNDANT GROUP B STREP(S.AGALACTIAE)ISOLATED Performed at [facility]    Report Status 03/11/2015 FINAL  Final  Culture, respiratory (NON-Expectorated)     Status: None (Preliminary result)   Collection Time: 03/16/15 12:02 AM  Result  Value Ref Range Status   Specimen Description TRACHEAL ASPIRATE  Final   Special Requests NONE  Final   Gram Stain   Final    NO WBC SEEN NO SQUAMOUS EPITHELIAL CELLS SEEN NO ORGANISMS SEEN Performed at [facility]    Culture PENDING  Incomplete   Report Status PENDING  Incomplete    Anti-infectives:  Anti-infectives    Start     Dose/Rate Route Frequency Ordered Stop   03/14/15 0900  cefTRIAXone (ROCEPHIN) 1 g in dextrose 5 % 50 mL IVPB     1 g 100 mL/hr over 30 Minutes Intravenous Every 24 hours 03/14/15 0835 03/16/15 0825    03/12/15 0900  cefTRIAXone (ROCEPHIN) injection 1 g  Status:  Discontinued     1 g Intramuscular Every 24 hours 03/12/15 0851 03/14/15 0835   03/09/15 1030  piperacillin-tazobactam (ZOSYN) IVPB 3.375 g  Status:  Discontinued     3.375 g 12.5 mL/hr over 240 Minutes Intravenous Every 8 hours 03/09/15 1000 03/12/15 0851      Best Practice/Protocols:  VTE Prophylaxis: Mechanical GI Prophylaxis: Proton Pump Inhibitor No sedation, no pressors  Consults:      Events:  Subjective:    Overnight Issues: Patient starting to respond to pain stimulus today with grimacing and slight upper ectremity movement.  Objective:  Vital signs for last 24 hours: Temp:  [98.4 F (36.9 C)-101.7 F (38.7 C)] 100.9 F (38.3 C) (02/08 0700) Pulse Rate:  [103-128] 116 (02/08 0900) Resp:  [19-31] 24 (02/08 0900) BP: (113-172)/(74-95) 139/77 mmHg (02/08 0900) SpO2:  [91 %-97 %] 93 % (02/08 0900) FiO2 (%):  [50 %-60 %] 50 % (02/08 0900) Weight:  [124.9 kg (275 lb 5.7 oz)] 124.9 kg (275 lb 5.7 oz) (02/08 0500)  Hemodynamic parameters for last 24 hours:    Intake/Output from previous day: 02/07 0701 - 02/08 0700 In: 3385 [I.V.:1200; NG/GT:1930; IV Piggyback:255] Out: 5325 [Urine:3150; Stool:2175]  Intake/Output this shift: Total I/O In: 200 [I.V.:100; NG/GT:50; IV Piggyback:50] Out: 70 [Urine:70]  Vent settings for last 24 hours: Vent Mode:  [-] PCV FiO2 (%):  [50 %-60 %] 50 % Set Rate:  [18 bmp] 18 bmp PEEP:  [8 cmH20] 8 cmH20 Plateau Pressure:  [22 cmH20-24 cmH20] 24 cmH20  Physical Exam:  General: no respiratory distress Neuro: RASS -3 or deeper and grimaces to pain stimulus Resp: clear to auscultation bilaterally CVS: regular rate and rhythm, S1, S2 normal, no murmur, click, rub or gallop and Sinus tachycardia GI: distended, hypoactive BS and wound clean Extremities: edema 2+  Results for orders placed or performed during the hospital encounter of 03/26/15 (from the past 24 hour(s))   Glucose, capillary     Status: Abnormal   Collection Time: 03/15/15 12:31 PM  Result Value Ref Range   Glucose-Capillary 252 (H) 65 - 99 mg/dL   Comment 1 Capillary Specimen    Comment 2 Notify RN   Glucose, capillary     Status: Abnormal   Collection Time: 03/15/15  3:56 PM  Result Value Ref Range   Glucose-Capillary 217 (H) 65 - 99 mg/dL   Comment 1 Capillary Specimen    Comment 2 Notify RN   Glucose, capillary     Status: Abnormal   Collection Time: 03/15/15  7:09 PM  Result Value Ref Range   Glucose-Capillary 195 (H) 65 - 99 mg/dL   Comment 1 Capillary Specimen    Comment 2 Notify RN    Comment 3 Document in Chart  Urinalysis, Routine w reflex microscopic (not at [facility])     Status: Abnormal   Collection Time: 03/15/15 10:48 PM  Result Value Ref Range   Color, Urine AMBER (A) YELLOW   APPearance CLEAR CLEAR   Specific Gravity, Urine 1.028 1.005 - 1.030   pH 6.0 5.0 - 8.0   Glucose, UA NEGATIVE NEGATIVE mg/dL   Hgb urine dipstick NEGATIVE NEGATIVE   Bilirubin Urine SMALL (A) NEGATIVE   Ketones, ur NEGATIVE NEGATIVE mg/dL   Protein, ur NEGATIVE NEGATIVE mg/dL   Nitrite NEGATIVE NEGATIVE   Leukocytes, UA NEGATIVE NEGATIVE  Glucose, capillary     Status: Abnormal   Collection Time: 03/15/15 11:27 PM  Result Value Ref Range   Glucose-Capillary 231 (H) 65 - 99 mg/dL   Comment 1 Capillary Specimen    Comment 2 Notify RN    Comment 3 Document in Chart   Culture, respiratory (NON-Expectorated)     Status: None (Preliminary result)   Collection Time: 03/16/15 12:02 AM  Result Value Ref Range   Specimen Description TRACHEAL ASPIRATE    Special Requests NONE    Gram Stain      NO WBC SEEN NO SQUAMOUS EPITHELIAL CELLS SEEN NO ORGANISMS SEEN Performed at [facility]    Culture PENDING    Report Status PENDING   Glucose, capillary     Status: Abnormal   Collection Time: 03/16/15  3:45 AM  Result Value Ref Range   Glucose-Capillary 201 (H) 65 - 99 mg/dL    Comment 1 Capillary Specimen    Comment 2 Notify RN    Comment 3 Document in Chart   Comprehensive metabolic panel     Status: Abnormal   Collection Time: 03/16/15  5:40 AM  Result Value Ref Range   Sodium 154 (H) 135 - 145 mmol/L   Potassium 3.5 3.5 - 5.1 mmol/L   Chloride 116 (H) 101 - 111 mmol/L   CO2 31 22 - 32 mmol/L   Glucose, Bld 196 (H) 65 - 99 mg/dL   BUN 22 (H) 6 - 20 mg/dL   Creatinine, Ser 1.61 0.61 - 1.24 mg/dL   Calcium 8.5 (L) 8.9 - 10.3 mg/dL   Total Protein 6.1 (L) 6.5 - 8.1 g/dL   Albumin 2.1 (L) 3.5 - 5.0 g/dL   AST 096 (H) 15 - 41 U/L   ALT 114 (H) 17 - 63 U/L   Alkaline Phosphatase 90 38 - 126 U/L   Total Bilirubin 5.9 (H) 0.3 - 1.2 mg/dL   GFR calc non Af Amer >60 >60 mL/min   GFR calc Af Amer >60 >60 mL/min   Anion gap 7 5 - 15  CBC with Differential/Platelet     Status: Abnormal   Collection Time: 03/16/15  5:40 AM  Result Value Ref Range   WBC 9.0 4.0 - 10.5 K/uL   RBC 3.16 (L) 4.22 - 5.81 MIL/uL   Hemoglobin 10.4 (L) 13.0 - 17.0 g/dL   HCT 04.5 (L) 40.9 - 81.1 %   MCV 110.8 (H) 78.0 - 100.0 fL   MCH 32.9 26.0 - 34.0 pg   MCHC 29.7 (L) 30.0 - 36.0 g/dL   RDW 91.4 (H) 78.2 - 95.6 %   Platelets 188 150 - 400 K/uL   Neutrophils Relative % 72 %   Lymphocytes Relative 17 %   Monocytes Relative 10 %   Eosinophils Relative 1 %   Basophils Relative 0 %   Neutro Abs 6.5 1.7 - 7.7 K/uL  Lymphs Abs 1.5 0.7 - 4.0 K/uL   Monocytes Absolute 0.9 0.1 - 1.0 K/uL   Eosinophils Absolute 0.1 0.0 - 0.7 K/uL   Basophils Absolute 0.0 0.0 - 0.1 K/uL   RBC Morphology MARKED POLYCHROMASIA   Ammonia     Status: Abnormal   Collection Time: 03/16/15  5:40 AM  Result Value Ref Range   Ammonia 69 (H) 9 - 35 umol/L  Glucose, capillary     Status: Abnormal   Collection Time: 03/16/15  7:44 AM  Result Value Ref Range   Glucose-Capillary 200 (H) 65 - 99 mg/dL   Comment 1 Notify RN      Assessment/Plan:   NEURO  Altered Mental Status:  coma and encephalopathy    Plan: Starting to come out of this with decreasing ammonia level to 69 today.  PULM  Atelectasis/collapse (focal)   Plan: bibasilar, CXR much more clear today.  CARDIO  Sinus Tachycardia   Plan: No specific treatment  RENAL  Urine output and renal function are great.   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  Will start prophylactic Lovenox  ID  Pneumonia (hospital acquired (not ventilator-associated) Strep)   Plan: Being adequately treated.  HEME  Anemia acute blood loss anemia and anemia of critical illness)   Plan: Stable and does not require blood transfusion  ENDO No changes or issues   Plan: CPM  Global Issues  Neurologically improved, probably related to lower ammonia level.  Also slightly improved on the ventilator.  Will check ABG.  Continue management otherwide.    LOS: 11 days   Additional comments:I reviewed the patient's new clinical lab test results. cbc/bmet and I reviewed the patients new imaging test results. cxr  Critical Care Total Time*: 30 Minutes  Quentina Fronek 03/16/2015  *Care during the described time interval was provided by me and/or other providers on the critical care team.  I have reviewed this patient's available data, including medical history, events of note, physical examination and test results as part of my evaluation.

## 2015-03-16 NOTE — Progress Notes (Signed)
CRITICAL VALUE ALERT  Critical value received:  Anaerobic blood cz growing gram + cocci  Date of notification:  03/16/15  Time of notification:  1558  Critical value read back:Yes.    Nurse who received alert:  August Longest rn   MD notified (1st page):  Paged dr. Lindie Spruce  Time of first page:  1600  MD notified (2nd page):  Time of second page:  Responding MD:  WYATT  Time MD responded:  1630  ORDERING VANCOMYCIN

## 2015-03-17 ENCOUNTER — Inpatient Hospital Stay (HOSPITAL_COMMUNITY): Payer: BLUE CROSS/BLUE SHIELD | Admitting: Anesthesiology

## 2015-03-17 ENCOUNTER — Inpatient Hospital Stay (HOSPITAL_COMMUNITY): Payer: BLUE CROSS/BLUE SHIELD

## 2015-03-17 LAB — GLUCOSE, CAPILLARY
GLUCOSE-CAPILLARY: 203 mg/dL — AB (ref 65–99)
GLUCOSE-CAPILLARY: 149 mg/dL — AB (ref 65–99)
GLUCOSE-CAPILLARY: 203 mg/dL — AB (ref 65–99)
Glucose-Capillary: 181 mg/dL — ABNORMAL HIGH (ref 65–99)
Glucose-Capillary: 200 mg/dL — ABNORMAL HIGH (ref 65–99)
Glucose-Capillary: 215 mg/dL — ABNORMAL HIGH (ref 65–99)

## 2015-03-17 LAB — AMMONIA: AMMONIA: 42 umol/L — AB (ref 9–35)

## 2015-03-17 MED ORDER — SODIUM CHLORIDE 0.9% FLUSH
10.0000 mL | Freq: Two times a day (BID) | INTRAVENOUS | Status: DC
  Administered 2015-03-17: 40 mL
  Administered 2015-03-17 – 2015-03-19 (×4): 10 mL
  Administered 2015-03-19: 30 mL

## 2015-03-17 MED ORDER — PROPOFOL 10 MG/ML IV BOLUS
INTRAVENOUS | Status: DC | PRN
  Administered 2015-03-17: 50 mg via INTRAVENOUS

## 2015-03-17 MED ORDER — SUCCINYLCHOLINE CHLORIDE 20 MG/ML IJ SOLN
INTRAMUSCULAR | Status: DC | PRN
  Administered 2015-03-17: 30 mg via INTRAVENOUS

## 2015-03-17 MED ORDER — SODIUM CHLORIDE 0.9% FLUSH: 10.0000 mL | INTRAVENOUS | Status: DC | PRN

## 2015-03-17 MED ORDER — ENOXAPARIN SODIUM 40 MG/0.4ML ~~LOC~~ SOLN
40.0000 mg | SUBCUTANEOUS | Status: DC
  Administered 2015-03-17 – 2015-03-28 (×12): 40 mg via SUBCUTANEOUS
  Filled 2015-03-17 (×12): qty 0.4

## 2015-03-17 MED ORDER — DEXTROSE 5 % IV SOLN
2.0000 g | INTRAVENOUS | Status: DC
  Administered 2015-03-17 – 2015-03-21 (×5): 2 g via INTRAVENOUS
  Filled 2015-03-17 (×6): qty 2

## 2015-03-17 NOTE — Progress Notes (Signed)
Patient ID: Ricky Molina., male   DOB: 1965/05/05, 50 y.o.   MRN: [id number] Called by RN and RT for issues with his ETT. Noted subglottic tube has been severed. Also has a cuff leak despite inflating cuff well. I called anesthesia to change it to a new ETT. Violeta Gelinas, MD, MPH, FACS Trauma: [phone] General Surgery: [phone]
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
## 2015-03-17 NOTE — Progress Notes (Signed)
Patient's ETT was swapped out due to the subglottic being broken and the integrity of the ETT cuff. After patient's tube was swapped out RT tried to decrease patients FIO2 to 60% patients O2 sat continued to drop. RT had to gradually increase patients FIO2 back to 100%.

## 2015-03-17 NOTE — Progress Notes (Signed)
MD notified that subglottic tube is broken. CRNA called to replace ETT.

## 2015-03-17 NOTE — Progress Notes (Signed)
Pt transferred to 3M13. Respiratory, RN, and Tech transported. Report given to Selena Batten, RN. Asher Muir Kili Gracy,RN

## 2015-03-18 ENCOUNTER — Inpatient Hospital Stay (HOSPITAL_COMMUNITY): Payer: BLUE CROSS/BLUE SHIELD

## 2015-03-18 LAB — BASIC METABOLIC PANEL
ANION GAP: 7 (ref 5–15)
BUN: 23 mg/dL — ABNORMAL HIGH (ref 6–20)
CO2: 31 mmol/L (ref 22–32)
Calcium: 8.2 mg/dL — ABNORMAL LOW (ref 8.9–10.3)
Chloride: 119 mmol/L — ABNORMAL HIGH (ref 101–111)
Creatinine, Ser: 0.74 mg/dL (ref 0.61–1.24)
GLUCOSE: 191 mg/dL — AB (ref 65–99)
POTASSIUM: 3.2 mmol/L — AB (ref 3.5–5.1)
Sodium: 157 mmol/L — ABNORMAL HIGH (ref 135–145)

## 2015-03-18 LAB — GLUCOSE, CAPILLARY
GLUCOSE-CAPILLARY: 127 mg/dL — AB (ref 65–99)
GLUCOSE-CAPILLARY: 173 mg/dL — AB (ref 65–99)
GLUCOSE-CAPILLARY: 181 mg/dL — AB (ref 65–99)
GLUCOSE-CAPILLARY: 197 mg/dL — AB (ref 65–99)
Glucose-Capillary: 160 mg/dL — ABNORMAL HIGH (ref 65–99)
Glucose-Capillary: 151 mg/dL — ABNORMAL HIGH (ref 65–99)

## 2015-03-18 LAB — CBC
HEMATOCRIT: 32.5 % — AB (ref 39.0–52.0)
Hemoglobin: 9.5 g/dL — ABNORMAL LOW (ref 13.0–17.0)
MCH: 33 pg (ref 26.0–34.0)
MCHC: 29.2 g/dL — ABNORMAL LOW (ref 30.0–36.0)
MCV: 112.8 fL — AB (ref 78.0–100.0)
PLATELETS: 170 10*3/uL (ref 150–400)
RBC: 2.88 MIL/uL — AB (ref 4.22–5.81)
RDW: 21.7 % — ABNORMAL HIGH (ref 11.5–15.5)
WBC: 8.1 10*3/uL (ref 4.0–10.5)

## 2015-03-18 LAB — CULTURE, RESPIRATORY W GRAM STAIN: Culture: NORMAL

## 2015-03-18 LAB — PROTIME-INR
INR: 1.56 — ABNORMAL HIGH (ref 0.00–1.49)
PROTHROMBIN TIME: 18.7 s — AB (ref 11.6–15.2)

## 2015-03-18 LAB — CULTURE, RESPIRATORY: GRAM STAIN: NONE SEEN

## 2015-03-18 LAB — AMMONIA: Ammonia: 46 umol/L — ABNORMAL HIGH (ref 9–35)

## 2015-03-18 MED ORDER — VANCOMYCIN HCL 10 G IV SOLR
1250.0000 mg | Freq: Three times a day (TID) | INTRAVENOUS | Status: DC
  Administered 2015-03-18 – 2015-03-19 (×4): 1250 mg via INTRAVENOUS
  Filled 2015-03-18 (×7): qty 1250

## 2015-03-18 MED ORDER — MIDAZOLAM HCL 2 MG/2ML IJ SOLN
3.0000 mg | Freq: Once | INTRAMUSCULAR | Status: AC
  Administered 2015-03-18: 3 mg via INTRAVENOUS

## 2015-03-18 MED ORDER — POTASSIUM CHLORIDE 10 MEQ/50ML IV SOLN
10.0000 meq | INTRAVENOUS | Status: AC
Start: 2015-03-18 — End: 2015-03-18
  Administered 2015-03-18 (×3): 10 meq via INTRAVENOUS
  Filled 2015-03-18 (×3): qty 50

## 2015-03-18 MED ORDER — FREE WATER
300.0000 mL | Status: DC
  Administered 2015-03-18 – 2015-03-20 (×11): 300 mL

## 2015-03-18 NOTE — Progress Notes (Signed)
Patients mode of ventilation changed per MD order. RT will continue to monitor.
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
## 2015-03-18 NOTE — Progress Notes (Signed)
D; pt back from MRI. Pt pulled NGT out during MRI. Attempted NGT insrtion x2 bilateral nosrtil, failed. Will check later.

## 2015-03-18 NOTE — Progress Notes (Signed)
Pharmacy Antibiotic Note  Ricky Molina. is a 50 y.o. male on day #3 vancomycin for gram positive bacteremia. Renal function is stable with good UOP.  Goal of therapy = vancomycin trough 15-20  Plan: 1) Increase vancomycin to  IV q8 2) Follow up cultures  Height:  (175.3 cm) Weight: 274 lb 4 oz (124.4 kg) IBW/kg (Calculated) : 70.7  Temp (24hrs), Avg:100.4 F (38 C), Min:98.4 F (36.9 C), Max:101.6 F (38.7 C)   Recent Labs Lab 03/12/15 0412 03/13/15 0508 03/14/15 0415 03/15/15 0310 03/16/15 0540 03/18/15 0411  WBC 4.7 4.4 4.2  --  9.0 8.1  CREATININE 0.67 0.72 0.71 0.74 0.74 0.74    Estimated Creatinine Clearance: 145.7 mL/min (by C-G formula based on Cr of 0.74).    Allergies  Allergen Reactions  . Morphine And Related     Antimicrobials this admission: Zosyn 2/1 >> 2/4 Vancomycin 2/8 >>  Dose adjustments this admission: N/A  Microbiology results: 2/8 Trach aspirate >> 2/7 Blood x2 >> gram + cocci in clusters 2/1 Trach aspirate >> abundant group B strep  Thank you for allowing pharmacy to be a part of this patient's care.  Fredrik Rigger 03/18/2015 7:51 AM

## 2015-03-18 NOTE — Progress Notes (Signed)
D; pt is in MRI, versed  and fent Iv given @ 0450. Pt was not hold still, notified MD, Get new orders.  versed IV given @ [phone]

## 2015-03-18 NOTE — Progress Notes (Signed)
Met with Ricky Molina, pt's wife's best friend at bedside.  Ricky Molina states wife is morbidly obese and legally blind, and that she will not be able to care for pt at discharge.  Ricky Molina is a [employer] herself...she states she could help out "some", but not 24h/day.    I spoke with pt's wife, Ricky Molina by phone, [phone]):  Explained my role and involvement with discharge planning.  Cecille Rubin acknowledges that pt likely will need extensive rehab prior to coming home at either in-hospital rehab or skilled nursing facility.   She states she feels that she has not been getting adequate updates regarding her husband, and feels "left out of the loop" because she is unable to visit patient.    Will discuss providing more frequent updates to wife with providers.  Assured wife that Case Manager/CSW will follow to assist with discharge planning as pt progresses.  She was appreciative of my call.    Reinaldo Raddle, RN, BSN  Trauma/Neuro ICU Case Manager [phone]

## 2015-03-19 ENCOUNTER — Inpatient Hospital Stay (HOSPITAL_COMMUNITY): Payer: BLUE CROSS/BLUE SHIELD

## 2015-03-19 DIAGNOSIS — J9600 Acute respiratory failure, unspecified whether with hypoxia or hypercapnia: Secondary | ICD-10-CM

## 2015-03-19 DIAGNOSIS — D62 Acute posthemorrhagic anemia: Secondary | ICD-10-CM

## 2015-03-19 DIAGNOSIS — S298XXA Other specified injuries of thorax, initial encounter: Secondary | ICD-10-CM

## 2015-03-19 DIAGNOSIS — R140 Abdominal distension (gaseous): Secondary | ICD-10-CM

## 2015-03-19 DIAGNOSIS — S299XXA Unspecified injury of thorax, initial encounter: Secondary | ICD-10-CM | POA: Insufficient documentation

## 2015-03-19 LAB — CULTURE, BLOOD (ROUTINE X 2)

## 2015-03-19 LAB — BASIC METABOLIC PANEL
ANION GAP: 6 (ref 5–15)
ANION GAP: 9 (ref 5–15)
BUN: 24 mg/dL — ABNORMAL HIGH (ref 6–20)
BUN: 27 mg/dL — AB (ref 6–20)
CALCIUM: 8.2 mg/dL — AB (ref 8.9–10.3)
CO2: 29 mmol/L (ref 22–32)
CO2: 28 mmol/L (ref 22–32)
Calcium: 8 mg/dL — ABNORMAL LOW (ref 8.9–10.3)
Chloride: 123 mmol/L — ABNORMAL HIGH (ref 101–111)
Chloride: 120 mmol/L — ABNORMAL HIGH (ref 101–111)
Creatinine, Ser: 0.79 mg/dL (ref 0.61–1.24)
Creatinine, Ser: 0.85 mg/dL (ref 0.61–1.24)
GFR calc Af Amer: >60 mL/min (ref >60)
GFR calc non Af Amer: >60 mL/min (ref >60)
GLUCOSE: 225 mg/dL — AB (ref 65–99)
Glucose, Bld: 194 mg/dL — ABNORMAL HIGH (ref 65–99)
POTASSIUM: 3.2 mmol/L — AB (ref 3.5–5.1)
POTASSIUM: 3.6 mmol/L (ref 3.5–5.1)
Sodium: 158 mmol/L — ABNORMAL HIGH (ref 135–145)
Sodium: 157 mmol/L — ABNORMAL HIGH (ref 135–145)

## 2015-03-19 LAB — CBC
HCT: 32.2 % — ABNORMAL LOW (ref 39.0–52.0)
Hemoglobin: 9.5 g/dL — ABNORMAL LOW (ref 13.0–17.0)
MCH: 34.2 pg — ABNORMAL HIGH (ref 26.0–34.0)
MCHC: 29.5 g/dL — ABNORMAL LOW (ref 30.0–36.0)
MCV: 115.8 fL — ABNORMAL HIGH (ref 78.0–100.0)
PLATELETS: 159 10*3/uL (ref 150–400)
RBC: 2.78 MIL/uL — AB (ref 4.22–5.81)
RDW: 21.8 % — AB (ref 11.5–15.5)
WBC: 6.4 10*3/uL (ref 4.0–10.5)

## 2015-03-19 LAB — GLUCOSE, CAPILLARY
GLUCOSE-CAPILLARY: 182 mg/dL — AB (ref 65–99)
GLUCOSE-CAPILLARY: 165 mg/dL — AB (ref 65–99)
GLUCOSE-CAPILLARY: 206 mg/dL — AB (ref 65–99)
GLUCOSE-CAPILLARY: 182 mg/dL — AB (ref 65–99)
Glucose-Capillary: 151 mg/dL — ABNORMAL HIGH (ref 65–99)
Glucose-Capillary: 175 mg/dL — ABNORMAL HIGH (ref 65–99)
Glucose-Capillary: 204 mg/dL — ABNORMAL HIGH (ref 65–99)

## 2015-03-19 LAB — AMMONIA: AMMONIA: 64 umol/L — AB (ref 9–35)

## 2015-03-19 MED ORDER — POTASSIUM CHLORIDE 20 MEQ/15ML (10%) PO SOLN
40.0000 meq | Freq: Once | ORAL | Status: AC
  Administered 2015-03-19: 40 meq
  Filled 2015-03-19: qty 30

## 2015-03-19 MED ORDER — DEXMEDETOMIDINE HCL IN NACL 200 MCG/50ML IV SOLN
0.4000 ug/kg/h | INTRAVENOUS | Status: DC
  Administered 2015-03-19 – 2015-03-20 (×3): 0.4 ug/kg/h via INTRAVENOUS
  Administered 2015-03-20: 0.2 ug/kg/h via INTRAVENOUS
  Administered 2015-03-20: 0.4 ug/kg/h via INTRAVENOUS
  Filled 2015-03-19 (×5): qty 50

## 2015-03-19 MED ORDER — NOREPINEPHRINE BITARTRATE 1 MG/ML IV SOLN
2.0000 ug/min | INTRAVENOUS | Status: DC
  Administered 2015-03-19: 10 ug/min via INTRAVENOUS
  Filled 2015-03-19 (×2): qty 16

## 2015-03-19 MED ORDER — DEXTROSE 5 % IV SOLN
INTRAVENOUS | Status: AC
  Administered 2015-03-19: 50 mL via INTRAVENOUS
  Administered 2015-03-20 – 2015-04-02 (×18): via INTRAVENOUS
  Administered 2015-04-02: 100 mL via INTRAVENOUS
  Administered 2015-04-04 (×2): via INTRAVENOUS

## 2015-03-19 MED ORDER — SODIUM CHLORIDE 0.9 % IV BOLUS (SEPSIS)
1000.0000 mL | Freq: Once | INTRAVENOUS | Status: AC
  Administered 2015-03-19: 1000 mL via INTRAVENOUS

## 2015-03-19 MED ORDER — FENTANYL CITRATE (PF) 100 MCG/2ML IJ SOLN
25.0000 ug | INTRAMUSCULAR | Status: AC | PRN
  Administered 2015-03-19 – 2015-03-20 (×3): 100 ug via INTRAVENOUS
  Administered 2015-03-20: 50 ug via INTRAVENOUS
  Filled 2015-03-19 (×3): qty 2
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
## 2015-03-20 ENCOUNTER — Inpatient Hospital Stay (HOSPITAL_COMMUNITY): Payer: BLUE CROSS/BLUE SHIELD

## 2015-03-20 DIAGNOSIS — E7220 Disorder of urea cycle metabolism, unspecified: Secondary | ICD-10-CM

## 2015-03-20 DIAGNOSIS — E0810 Diabetes mellitus due to underlying condition with ketoacidosis without coma: Secondary | ICD-10-CM

## 2015-03-20 DIAGNOSIS — R5600 Simple febrile convulsions: Secondary | ICD-10-CM

## 2015-03-20 LAB — CBC
HEMATOCRIT: 32 % — AB (ref 39.0–52.0)
Hemoglobin: 9.3 g/dL — ABNORMAL LOW (ref 13.0–17.0)
MCH: 33.5 pg (ref 26.0–34.0)
MCHC: 29.1 g/dL — AB (ref 30.0–36.0)
MCV: 115.1 fL — ABNORMAL HIGH (ref 78.0–100.0)
PLATELETS: 184 10*3/uL (ref 150–400)
RBC: 2.78 MIL/uL — ABNORMAL LOW (ref 4.22–5.81)
RDW: 21.8 % — AB (ref 11.5–15.5)
WBC: 6.6 10*3/uL (ref 4.0–10.5)

## 2015-03-20 LAB — BASIC METABOLIC PANEL
ANION GAP: 7 (ref 5–15)
Anion gap: 9 (ref 5–15)
Anion gap: 10 (ref 5–15)
BUN: 30 mg/dL — AB (ref 6–20)
BUN: 27 mg/dL — ABNORMAL HIGH (ref 6–20)
BUN: 21 mg/dL — ABNORMAL HIGH (ref 6–20)
CALCIUM: 8.3 mg/dL — AB (ref 8.9–10.3)
CALCIUM: 8.3 mg/dL — AB (ref 8.9–10.3)
CO2: 26 mmol/L (ref 22–32)
CO2: 27 mmol/L (ref 22–32)
CO2: 28 mmol/L (ref 22–32)
CREATININE: 0.76 mg/dL (ref 0.61–1.24)
CREATININE: 0.8 mg/dL (ref 0.61–1.24)
CREATININE: 0.69 mg/dL (ref 0.61–1.24)
Calcium: 8.2 mg/dL — ABNORMAL LOW (ref 8.9–10.3)
Chloride: 127 mmol/L — ABNORMAL HIGH (ref 101–111)
Chloride: 124 mmol/L — ABNORMAL HIGH (ref 101–111)
Chloride: 126 mmol/L — ABNORMAL HIGH (ref 101–111)
GFR calc Af Amer: >60 mL/min (ref >60)
GFR calc non Af Amer: >60 mL/min (ref >60)
GLUCOSE: 230 mg/dL — AB (ref 65–99)
Glucose, Bld: 221 mg/dL — ABNORMAL HIGH (ref 65–99)
Glucose, Bld: 146 mg/dL — ABNORMAL HIGH (ref 65–99)
Potassium: 3.5 mmol/L (ref 3.5–5.1)
Potassium: 3.6 mmol/L (ref 3.5–5.1)
Potassium: 3.2 mmol/L — ABNORMAL LOW (ref 3.5–5.1)
SODIUM: 162 mmol/L — AB (ref 135–145)
Sodium: 161 mmol/L (ref 135–145)
Sodium: 161 mmol/L (ref 135–145)

## 2015-03-20 LAB — CORTISOL: Cortisol, Plasma: 11.6 ug/dL

## 2015-03-20 LAB — GLUCOSE, CAPILLARY
GLUCOSE-CAPILLARY: 202 mg/dL — AB (ref 65–99)
GLUCOSE-CAPILLARY: 184 mg/dL — AB (ref 65–99)
GLUCOSE-CAPILLARY: 147 mg/dL — AB (ref 65–99)
Glucose-Capillary: 168 mg/dL — ABNORMAL HIGH (ref 65–99)
Glucose-Capillary: 174 mg/dL — ABNORMAL HIGH (ref 65–99)
Glucose-Capillary: 192 mg/dL — ABNORMAL HIGH (ref 65–99)

## 2015-03-20 LAB — CULTURE, BLOOD (ROUTINE X 2): CULTURE: NO GROWTH

## 2015-03-20 LAB — AMMONIA: Ammonia: 49 umol/L — ABNORMAL HIGH (ref 9–35)

## 2015-03-20 MED ORDER — SODIUM CHLORIDE 0.45 % IV SOLN
INTRAVENOUS | Status: DC
  Administered 2015-03-20: 06:00:00 via INTRAVENOUS

## 2015-03-20 MED ORDER — POTASSIUM CHLORIDE 10 MEQ/50ML IV SOLN
10.0000 meq | INTRAVENOUS | Status: AC
Start: 2015-03-21 — End: 2015-03-21
  Administered 2015-03-21 (×6): 10 meq via INTRAVENOUS
  Filled 2015-03-20 (×6): qty 50

## 2015-03-20 MED ORDER — FREE WATER
400.0000 mL | Status: DC
  Administered 2015-03-20 (×2): 400 mL
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
## 2015-03-20 NOTE — Progress Notes (Signed)
Dr. Chales Abrahams, CCM MD notified of patient's critical Sodium of 161 and patient's low urinary output. No new orders at this time. Will continue to monitor patient.
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
## 2015-03-20 NOTE — Progress Notes (Signed)
Spoke with Dr. Luisa Hart regarding patient's hypotension. Dr. Luisa Hart stated will to give 1 liter NS bolus.
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
## 2015-03-20 NOTE — Progress Notes (Signed)
eLink Physician-Brief Progress Note Patient Name: Ricky Molina. DOB: 08-Mar-1965 MRN: [id number]   Date of Service  03/20/2015  HPI/Events of Note  Bedside nurse notified of worsening hypernatremia with a.m. Labs. Did received bolus of normal saline overnight. Receiving free water flush via OG tube 300 mL every 4 hour. KVO IV fluid also normal saline.  eICU Interventions  1. Switching KVO IV fluid half normal saline 2. Continuing free water flush 400 mL via tube every 4 hour     Intervention Category Intermediate Interventions: Electrolyte abnormality - evaluation and management  Lawanda Cousins 03/20/2015, 5:15 AM

## 2015-03-20 NOTE — Progress Notes (Signed)
eLink Physician-Brief Progress Note Patient Name: Ricky Molina. DOB: 1965-03-28 MRN: [id number]   Date of Service  03/20/2015  HPI/Events of Note  Hypokalemia  eICU Interventions  Potassium replaced     Intervention Category Intermediate Interventions: Electrolyte abnormality - evaluation and management  DETERDING,ELIZABETH 03/20/2015, 11:48 PM

## 2015-03-20 NOTE — Procedures (Signed)
Extubation Procedure Note  Patient Details:   Name: Ricky Molina. DOB: [birth year] MRN: [id number]   Airway Documentation:     Evaluation  O2 sats: stable throughout Complications: No apparent complications Patient did tolerate procedure well. Bilateral Breath Sounds: Rhonchi Suctioning: Airway Yes   Positive cuff leak noted prior to extubation. Pt placed on [place] 4 Lpm with humidity. No stridor noted.  Forest Becker Jayquon Theiler 03/20/2015, 1:10 PM

## 2015-03-20 NOTE — Progress Notes (Signed)
Dr. Jamison Neighbor notified of critical sodium of 162.

## 2015-03-20 NOTE — Progress Notes (Signed)
Dr. Luisa Hart notified that after 1 liter NS bolus, patient continues to be hypotensive. Will start Levophed on patient as directed by MD.
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
## 2015-03-21 ENCOUNTER — Inpatient Hospital Stay (HOSPITAL_COMMUNITY): Payer: BLUE CROSS/BLUE SHIELD

## 2015-03-21 DIAGNOSIS — F1994 Other psychoactive substance use, unspecified with psychoactive substance-induced mood disorder: Secondary | ICD-10-CM

## 2015-03-21 DIAGNOSIS — R45851 Suicidal ideations: Secondary | ICD-10-CM

## 2015-03-21 LAB — BASIC METABOLIC PANEL
Anion gap: 6 (ref 5–15)
Anion gap: 9 (ref 5–15)
BUN: 17 mg/dL (ref 6–20)
BUN: 17 mg/dL (ref 6–20)
CHLORIDE: 122 mmol/L — AB (ref 101–111)
CO2: 24 mmol/L (ref 22–32)
CO2: 25 mmol/L (ref 22–32)
CREATININE: 0.62 mg/dL (ref 0.61–1.24)
CREATININE: 0.68 mg/dL (ref 0.61–1.24)
Calcium: 7.2 mg/dL — ABNORMAL LOW (ref 8.9–10.3)
Calcium: 7.9 mg/dL — ABNORMAL LOW (ref 8.9–10.3)
Chloride: 125 mmol/L — ABNORMAL HIGH (ref 101–111)
GFR calc Af Amer: >60 mL/min (ref >60)
GFR calc non Af Amer: >60 mL/min (ref >60)
GLUCOSE: 158 mg/dL — AB (ref 65–99)
GLUCOSE: 170 mg/dL — AB (ref 65–99)
POTASSIUM: 3 mmol/L — AB (ref 3.5–5.1)
Potassium: 3.5 mmol/L (ref 3.5–5.1)
SODIUM: 155 mmol/L — AB (ref 135–145)
Sodium: 156 mmol/L — ABNORMAL HIGH (ref 135–145)

## 2015-03-21 LAB — GLUCOSE, CAPILLARY
GLUCOSE-CAPILLARY: 125 mg/dL — AB (ref 65–99)
Glucose-Capillary: 130 mg/dL — ABNORMAL HIGH (ref 65–99)
Glucose-Capillary: 135 mg/dL — ABNORMAL HIGH (ref 65–99)
Glucose-Capillary: 143 mg/dL — ABNORMAL HIGH (ref 65–99)
Glucose-Capillary: 153 mg/dL — ABNORMAL HIGH (ref 65–99)

## 2015-03-21 MED ORDER — PIVOT 1.5 CAL PO LIQD
1000.0000 mL | ORAL | Status: DC
  Administered 2015-03-21: 1000 mL

## 2015-03-21 MED ORDER — POTASSIUM CHLORIDE 10 MEQ/50ML IV SOLN
10.0000 meq | INTRAVENOUS | Status: AC
  Administered 2015-03-21 (×6): 10 meq via INTRAVENOUS
  Filled 2015-03-21 (×6): qty 50
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
## 2015-03-22 ENCOUNTER — Inpatient Hospital Stay (HOSPITAL_COMMUNITY): Payer: BLUE CROSS/BLUE SHIELD

## 2015-03-22 LAB — CBC WITH DIFFERENTIAL/PLATELET
BASOS PCT: 1 %
Basophils Absolute: 0.1 10*3/uL (ref 0.0–0.1)
EOS PCT: 3 %
Eosinophils Absolute: 0.2 10*3/uL (ref 0.0–0.7)
HCT: 32.4 % — ABNORMAL LOW (ref 39.0–52.0)
HEMOGLOBIN: 9.4 g/dL — AB (ref 13.0–17.0)
LYMPHS PCT: 20 %
Lymphs Abs: 1 10*3/uL (ref 0.7–4.0)
MCH: 33.1 pg (ref 26.0–34.0)
MCHC: 29 g/dL — ABNORMAL LOW (ref 30.0–36.0)
MCV: 114.1 fL — AB (ref 78.0–100.0)
MONO ABS: 0.4 10*3/uL (ref 0.1–1.0)
MONOS PCT: 7 %
NEUTROS PCT: 69 %
Neutro Abs: 3.3 10*3/uL (ref 1.7–7.7)
PLATELETS: 153 10*3/uL (ref 150–400)
RBC: 2.84 MIL/uL — AB (ref 4.22–5.81)
RDW: 21.4 % — ABNORMAL HIGH (ref 11.5–15.5)
WBC: 5 10*3/uL (ref 4.0–10.5)

## 2015-03-22 LAB — BASIC METABOLIC PANEL
ANION GAP: 7 (ref 5–15)
BUN: 13 mg/dL (ref 6–20)
CHLORIDE: 122 mmol/L — AB (ref 101–111)
CO2: 25 mmol/L (ref 22–32)
Calcium: 8 mg/dL — ABNORMAL LOW (ref 8.9–10.3)
Creatinine, Ser: 0.67 mg/dL (ref 0.61–1.24)
GFR calc Af Amer: >60 mL/min (ref >60)
GLUCOSE: 150 mg/dL — AB (ref 65–99)
Potassium: 3.6 mmol/L (ref 3.5–5.1)
SODIUM: 154 mmol/L — AB (ref 135–145)

## 2015-03-22 LAB — GLUCOSE, CAPILLARY
GLUCOSE-CAPILLARY: 146 mg/dL — AB (ref 65–99)
GLUCOSE-CAPILLARY: 141 mg/dL — AB (ref 65–99)
GLUCOSE-CAPILLARY: 118 mg/dL — AB (ref 65–99)
GLUCOSE-CAPILLARY: 124 mg/dL — AB (ref 65–99)
Glucose-Capillary: 130 mg/dL — ABNORMAL HIGH (ref 65–99)
Glucose-Capillary: 131 mg/dL — ABNORMAL HIGH (ref 65–99)
Glucose-Capillary: 136 mg/dL — ABNORMAL HIGH (ref 65–99)
Glucose-Capillary: 152 mg/dL — ABNORMAL HIGH (ref 65–99)

## 2015-03-22 LAB — AMMONIA: AMMONIA: 20 umol/L (ref 9–35)

## 2015-03-22 MED ORDER — PIVOT 1.5 CAL PO LIQD
1000.0000 mL | ORAL | Status: DC
  Administered 2015-03-22 – 2015-03-28 (×8): 1000 mL
  Filled 2015-03-22 (×13): qty 1000
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
## 2015-03-22 NOTE — Progress Notes (Signed)
Rehab Admissions Coordinator Note:  Patient was screened by Trish Mage for appropriateness for an Inpatient Acute Rehab Consult.  At this time, we are recommending Inpatient Rehab consult.  Trish Mage 03/22/2015, 2:45 PM  I can be reached at [phone].
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
## 2015-03-23 DIAGNOSIS — K7290 Hepatic failure, unspecified without coma: Secondary | ICD-10-CM

## 2015-03-23 LAB — CBC
HEMATOCRIT: 32.8 % — AB (ref 39.0–52.0)
HEMOGLOBIN: 9.5 g/dL — AB (ref 13.0–17.0)
MCH: 33 pg (ref 26.0–34.0)
MCHC: 29 g/dL — ABNORMAL LOW (ref 30.0–36.0)
MCV: 113.9 fL — AB (ref 78.0–100.0)
Platelets: 155 10*3/uL (ref 150–400)
RBC: 2.88 MIL/uL — AB (ref 4.22–5.81)
RDW: 20.9 % — AB (ref 11.5–15.5)
WBC: 5.5 10*3/uL (ref 4.0–10.5)

## 2015-03-23 LAB — BASIC METABOLIC PANEL
ANION GAP: 6 (ref 5–15)
BUN: 13 mg/dL (ref 6–20)
CALCIUM: 7.9 mg/dL — AB (ref 8.9–10.3)
CO2: 28 mmol/L (ref 22–32)
Chloride: 117 mmol/L — ABNORMAL HIGH (ref 101–111)
Creatinine, Ser: 0.58 mg/dL — ABNORMAL LOW (ref 0.61–1.24)
GFR calc Af Amer: >60 mL/min (ref >60)
GFR calc non Af Amer: >60 mL/min (ref >60)
GLUCOSE: 192 mg/dL — AB (ref 65–99)
POTASSIUM: 3.7 mmol/L (ref 3.5–5.1)
Sodium: 151 mmol/L — ABNORMAL HIGH (ref 135–145)

## 2015-03-23 LAB — AMMONIA: Ammonia: 65 umol/L — ABNORMAL HIGH (ref 9–35)

## 2015-03-23 LAB — GLUCOSE, CAPILLARY: GLUCOSE-CAPILLARY: 166 mg/dL — AB (ref 65–99)

## 2015-03-23 MED ORDER — LEVETIRACETAM 100 MG/ML PO SOLN
500.0000 mg | Freq: Two times a day (BID) | ORAL | Status: DC
  Administered 2015-03-23 – 2015-04-09 (×35): 500 mg
  Filled 2015-03-23 (×38): qty 5

## 2015-03-23 MED ORDER — INSULIN GLARGINE 100 UNIT/ML ~~LOC~~ SOLN
22.0000 [IU] | Freq: Two times a day (BID) | SUBCUTANEOUS | Status: DC
  Administered 2015-03-23 – 2015-03-27 (×8): 22 [IU] via SUBCUTANEOUS
  Filled 2015-03-23 (×11): qty 0.22
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
## 2015-03-23 NOTE — Progress Notes (Signed)
Arrived to 6n17 at this time. Sitter at bedside

## 2015-03-23 NOTE — Progress Notes (Signed)
I will begin discussions with wife to determine caregiver support available at home. We will need to assess pt's ability to fully participate in intense therapies before determining rehab venue options. I will follow up tomorrow. [phone]

## 2015-03-23 NOTE — Progress Notes (Signed)
Report called to [place], RN for [phone].
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
## 2015-03-23 NOTE — Progress Notes (Signed)
Tube feeding infusion rate increased from 45 ml/hr to 55 ml/hr.
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
## 2015-03-24 LAB — GLUCOSE, CAPILLARY
GLUCOSE-CAPILLARY: 176 mg/dL — AB (ref 65–99)
GLUCOSE-CAPILLARY: 181 mg/dL — AB (ref 65–99)
GLUCOSE-CAPILLARY: 216 mg/dL — AB (ref 65–99)
GLUCOSE-CAPILLARY: 224 mg/dL — AB (ref 65–99)
GLUCOSE-CAPILLARY: 207 mg/dL — AB (ref 65–99)
Glucose-Capillary: 191 mg/dL — ABNORMAL HIGH (ref 65–99)
Glucose-Capillary: 203 mg/dL — ABNORMAL HIGH (ref 65–99)
Glucose-Capillary: 199 mg/dL — ABNORMAL HIGH (ref 65–99)
Glucose-Capillary: 171 mg/dL — ABNORMAL HIGH (ref 65–99)

## 2015-03-24 LAB — BASIC METABOLIC PANEL
ANION GAP: 4 — AB (ref 5–15)
BUN: 12 mg/dL (ref 6–20)
CALCIUM: 7.9 mg/dL — AB (ref 8.9–10.3)
CO2: 29 mmol/L (ref 22–32)
Chloride: 117 mmol/L — ABNORMAL HIGH (ref 101–111)
Creatinine, Ser: 0.55 mg/dL — ABNORMAL LOW (ref 0.61–1.24)
Glucose, Bld: 206 mg/dL — ABNORMAL HIGH (ref 65–99)
Potassium: 3.7 mmol/L (ref 3.5–5.1)
SODIUM: 150 mmol/L — AB (ref 135–145)

## 2015-03-24 LAB — AMMONIA: Ammonia: 51 umol/L — ABNORMAL HIGH (ref 9–35)

## 2015-03-24 MED ORDER — SODIUM CHLORIDE 0.9% FLUSH
10.0000 mL | INTRAVENOUS | Status: DC | PRN
  Administered 2015-03-24 (×4): 10 mL
  Administered 2015-03-26: 20 mL
  Administered 2015-03-28 – 2015-03-30 (×3): 10 mL
  Administered 2015-03-31 (×2): 20 mL
  Administered 2015-04-01 – 2015-04-03 (×4): 10 mL
  Filled 2015-03-24 (×13): qty 40
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
## 2015-03-24 NOTE — Progress Notes (Signed)
Chaplain provided spiritual care visitation for patient, he was awake at the time of this visit, and acknowledged visit with the nodding of his head. Chaplain informed him a prayer of comfort and healing was been said on his behalf. Chaplain will follow up as needed. Chaplain Janell Quiet [phone]

## 2015-03-24 NOTE — Progress Notes (Signed)
Inpatient Diabetes Program Recommendations  AACE/ADA: New Consensus Statement on Inpatient Glycemic Control (2015)  Target Ranges:  Prepandial:   less than 140 mg/dL      Peak postprandial:   less than 180 mg/dL (1-2 hours)      Critically ill patients:  140 - 180 mg/dL   Review of Glycemic Control:  Results for LANORRIS, KALISZ (MRN [id number]) as of 03/24/2015 15:34  Ref. Range 03/23/2015 03:19 03/23/2015 17:26 03/23/2015 20:22 03/23/2015 23:55 03/24/2015 07:30  Glucose-Capillary Latest Ref Range: 65-99 mg/dL 409 (H) 811 (H) 914 (H) 203 (H) 216 (H)   Diabetes history: Diabetes Outpatient Diabetes medications:  Amaryl 6 mg daily Current orders for Inpatient glycemic control:  Lantus 22 units bid, Novolog resistant q 4 hours  Inpatient Diabetes Program Recommendations:    Note that CBG's increased with tube feeds.  If CBG's remain greater than 180 mg/dL, may consider adding Novolog tube feed coverage 3 units q 4 hours (If tube feeds held, will need to hold tube feed coverage).  If tube feed coverage added, consider decreasing Novolog correction to moderate q 4 hours.    Thanks, Beryl Meager, RN, BC-ADM Inpatient Diabetes Coordinator Pager [phone] (8a-5p)
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
## 2015-03-25 LAB — CULTURE, BLOOD (ROUTINE X 2)
CULTURE: NO GROWTH
Culture: NO GROWTH

## 2015-03-25 LAB — AMMONIA: Ammonia: 84 umol/L — ABNORMAL HIGH (ref 9–35)

## 2015-03-25 LAB — GLUCOSE, CAPILLARY
GLUCOSE-CAPILLARY: 200 mg/dL — AB (ref 65–99)
GLUCOSE-CAPILLARY: 218 mg/dL — AB (ref 65–99)
GLUCOSE-CAPILLARY: 222 mg/dL — AB (ref 65–99)
GLUCOSE-CAPILLARY: 249 mg/dL — AB (ref 65–99)
Glucose-Capillary: 189 mg/dL — ABNORMAL HIGH (ref 65–99)
Glucose-Capillary: 232 mg/dL — ABNORMAL HIGH (ref 65–99)
Glucose-Capillary: 238 mg/dL — ABNORMAL HIGH (ref 65–99)
Glucose-Capillary: 248 mg/dL — ABNORMAL HIGH (ref 65–99)

## 2015-03-25 LAB — C DIFFICILE QUICK SCREEN W PCR REFLEX
C DIFFICILE (CDIFF) INTERP: NEGATIVE
C DIFFICLE (CDIFF) ANTIGEN: NEGATIVE
C Diff toxin: NEGATIVE

## 2015-03-25 MED ORDER — LACTULOSE 10 GM/15ML PO SOLN
30.0000 g | Freq: Three times a day (TID) | ORAL | Status: DC
  Administered 2015-03-25 (×2): 30 g via ORAL
  Filled 2015-03-25 (×2): qty 45
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
## 2015-03-25 NOTE — Progress Notes (Signed)
Inpatient Diabetes Program Recommendations  AACE/ADA: New Consensus Statement on Inpatient Glycemic Control (2015)  Target Ranges:  Prepandial:   less than 140 mg/dL      Peak postprandial:   less than 180 mg/dL (1-2 hours)      Critically ill patients:  140 - 180 mg/dL   Results for DERRALL, HICKS (MRN [id number]) as of 03/25/2015 09:57  Ref. Range 03/23/2015 23:55 03/24/2015 04:01 03/24/2015 07:30 03/24/2015 11:25 03/24/2015 15:52 03/24/2015 19:53  Glucose-Capillary Latest Ref Range: 65-99 mg/dL 829 (H) 562 (H) 130 (H) 224 (H) 207 (H) 222 (H)   Results for WILBORN, MEMBRENO (MRN [id number]) as of 03/25/2015 09:57  Ref. Range 03/24/2015 23:34 03/25/2015 03:21 03/25/2015 07:27  Glucose-Capillary Latest Ref Range: 65-99 mg/dL 295 (H) 284 (H) 132 (H)      Outpatient Diabetes Medications: Amaryl 6 mg daily  Current orders: Lantus 22 units bid     Novolog Resistant SSI (0-20 units) Q4 hours    MD- Note that CBG's increased with tube feeds.   Please consider adding Novolog tube feed coverage:  Novolog 3 units Q4 hours (hold if tube feeds held for any reason)     --Will follow patient during hospitalization--  Ambrose Finland RN, MSN, CDE Diabetes Coordinator Inpatient Glycemic Control Team Team Pager: [phone] (8a-5p)

## 2015-03-25 NOTE — Progress Notes (Signed)
Ammonia levels continue to climb today; lactulose restarted.  [place] feeding tube replaced for feedings/meds.  Per CIR admissions liaison, inpatient rehab will not be an option, as patient has limited home support (wife disabled).  May need to consider possible LTAC consult while medical issues persist, though uncertain if insurance will approve.  Otherwise, pt will need SNF when medical issues resolved.  If SNF, pt will need to either be able to swallow or have PEG.    Will follow progress.  Quintella Baton, RN, BSN  Trauma/Neuro ICU Case Manager [phone]
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
## 2015-03-26 LAB — GLUCOSE, CAPILLARY
GLUCOSE-CAPILLARY: 235 mg/dL — AB (ref 65–99)
Glucose-Capillary: 186 mg/dL — ABNORMAL HIGH (ref 65–99)
Glucose-Capillary: 201 mg/dL — ABNORMAL HIGH (ref 65–99)
Glucose-Capillary: 211 mg/dL — ABNORMAL HIGH (ref 65–99)
Glucose-Capillary: 225 mg/dL — ABNORMAL HIGH (ref 65–99)
Glucose-Capillary: 232 mg/dL — ABNORMAL HIGH (ref 65–99)

## 2015-03-26 LAB — AMMONIA: Ammonia: 71 umol/L — ABNORMAL HIGH (ref 9–35)

## 2015-03-26 LAB — BASIC METABOLIC PANEL WITH GFR
Anion gap: 8 (ref 5–15)
BUN: 13 mg/dL (ref 6–20)
CO2: 30 mmol/L (ref 22–32)
Calcium: 8.1 mg/dL — ABNORMAL LOW (ref 8.9–10.3)
Chloride: 112 mmol/L — ABNORMAL HIGH (ref 101–111)
Creatinine, Ser: 0.5 mg/dL — ABNORMAL LOW (ref 0.61–1.24)
GFR calc Af Amer: >60 mL/min (ref >60)
GFR calc non Af Amer: >60 mL/min (ref >60)
Glucose, Bld: 210 mg/dL — ABNORMAL HIGH (ref 65–99)
Potassium: 3.9 mmol/L (ref 3.5–5.1)
Sodium: 150 mmol/L — ABNORMAL HIGH (ref 135–145)

## 2015-03-26 MED ORDER — LACTULOSE 10 GM/15ML PO SOLN
30.0000 g | Freq: Two times a day (BID) | ORAL | Status: DC
  Administered 2015-03-26 – 2015-03-28 (×6): 30 g via ORAL
  Filled 2015-03-26 (×6): qty 45

## 2015-03-26 NOTE — Progress Notes (Signed)
Patient's wife Ricky Molina wants speech therapist to contact her on [phone]
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
## 2015-03-27 LAB — BASIC METABOLIC PANEL
Anion gap: 5 (ref 5–15)
BUN: 15 mg/dL (ref 6–20)
CALCIUM: 8.1 mg/dL — AB (ref 8.9–10.3)
CO2: 31 mmol/L (ref 22–32)
CREATININE: 0.48 mg/dL — AB (ref 0.61–1.24)
Chloride: 112 mmol/L — ABNORMAL HIGH (ref 101–111)
GFR calc non Af Amer: >60 mL/min (ref >60)
Glucose, Bld: 255 mg/dL — ABNORMAL HIGH (ref 65–99)
Potassium: 4.1 mmol/L (ref 3.5–5.1)
SODIUM: 148 mmol/L — AB (ref 135–145)

## 2015-03-27 LAB — CBC
HCT: 33.6 % — ABNORMAL LOW (ref 39.0–52.0)
Hemoglobin: 10.2 g/dL — ABNORMAL LOW (ref 13.0–17.0)
MCH: 34.8 pg — ABNORMAL HIGH (ref 26.0–34.0)
MCHC: 30.4 g/dL (ref 30.0–36.0)
MCV: 114.7 fL — AB (ref 78.0–100.0)
PLATELETS: 111 10*3/uL — AB (ref 150–400)
RBC: 2.93 MIL/uL — ABNORMAL LOW (ref 4.22–5.81)
RDW: 20.2 % — AB (ref 11.5–15.5)
WBC: 5.4 10*3/uL (ref 4.0–10.5)

## 2015-03-27 LAB — GLUCOSE, CAPILLARY
GLUCOSE-CAPILLARY: 225 mg/dL — AB (ref 65–99)
GLUCOSE-CAPILLARY: 209 mg/dL — AB (ref 65–99)
GLUCOSE-CAPILLARY: 207 mg/dL — AB (ref 65–99)
Glucose-Capillary: 210 mg/dL — ABNORMAL HIGH (ref 65–99)

## 2015-03-27 LAB — AMMONIA: Ammonia: 61 umol/L — ABNORMAL HIGH (ref 9–35)

## 2015-03-27 MED ORDER — INSULIN GLARGINE 100 UNIT/ML ~~LOC~~ SOLN: 30.0000 [IU] | Freq: Every day | SUBCUTANEOUS | Status: DC

## 2015-03-27 MED ORDER — INSULIN GLARGINE 100 UNIT/ML ~~LOC~~ SOLN
30.0000 [IU] | Freq: Two times a day (BID) | SUBCUTANEOUS | Status: DC
  Administered 2015-03-27 – 2015-04-01 (×12): 30 [IU] via SUBCUTANEOUS
  Filled 2015-03-27 (×15): qty 0.3

## 2015-03-27 MED ORDER — INSULIN ASPART 100 UNIT/ML ~~LOC~~ SOLN
3.0000 [IU] | SUBCUTANEOUS | Status: DC
  Administered 2015-03-27 (×3): 9 [IU] via SUBCUTANEOUS
  Administered 2015-03-27 – 2015-03-28 (×3): 6 [IU] via SUBCUTANEOUS
  Administered 2015-03-28: 3 [IU] via SUBCUTANEOUS
  Administered 2015-03-28: 6 [IU] via SUBCUTANEOUS
  Administered 2015-03-28 – 2015-03-29 (×3): 3 [IU] via SUBCUTANEOUS
  Administered 2015-03-29: 6 [IU] via SUBCUTANEOUS
  Administered 2015-03-29 (×2): 3 [IU] via SUBCUTANEOUS
  Administered 2015-03-29 – 2015-03-30 (×2): 6 [IU] via SUBCUTANEOUS
  Administered 2015-03-30 (×2): 3 [IU] via SUBCUTANEOUS
  Administered 2015-03-30: 6 [IU] via SUBCUTANEOUS
  Administered 2015-03-30: 3 [IU] via SUBCUTANEOUS
  Administered 2015-03-31: 6 [IU] via SUBCUTANEOUS
  Administered 2015-03-31 (×2): 3 [IU] via SUBCUTANEOUS
  Administered 2015-03-31: 6 [IU] via SUBCUTANEOUS
  Administered 2015-03-31: 3 [IU] via SUBCUTANEOUS
  Administered 2015-04-01 (×6): 6 [IU] via SUBCUTANEOUS
  Administered 2015-04-02: 3 [IU] via SUBCUTANEOUS
  Administered 2015-04-02 (×2): 6 [IU] via SUBCUTANEOUS
  Administered 2015-04-03 – 2015-04-07 (×16): 3 [IU] via SUBCUTANEOUS
  Administered 2015-04-07: 6 [IU] via SUBCUTANEOUS
  Administered 2015-04-07 (×3): 3 [IU] via SUBCUTANEOUS
  Administered 2015-04-08: 6 [IU] via SUBCUTANEOUS
  Administered 2015-04-08: 3 [IU] via SUBCUTANEOUS
  Administered 2015-04-08: 6 [IU] via SUBCUTANEOUS
  Administered 2015-04-09: 3 [IU] via SUBCUTANEOUS

## 2015-03-27 NOTE — Progress Notes (Signed)
Flexi seal keeps coming out of pt's rectum. Reinserted twice already this pm

## 2015-03-27 NOTE — Progress Notes (Signed)
Flexi seal placed due to multiple loose bowel movements
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
## 2015-03-27 NOTE — Clinical Social Work Note (Signed)
Clinical Social Work Assessment  Patient Details  Name: Ricky Molina. MRN: [id number] Date of Birth: 1965-03-12  Date of referral:  03/25/15               Reason for consult:  Facility Placement, Discharge Planning                Permission sought to share information with:  Case Manager, Family Supports Permission granted to share information::  Yes, Verbal Permission Granted  Name::      Michel Bickers)  Agency::     Relationship::  Spouse   Contact Information:  [phone]  Housing/Transportation Living arrangements for the past 2 months:  Single Family Home Source of Information:  Spouse Patient Interpreter Needed:  None Criminal Activity/Legal Involvement Pertinent to Current Situation/Hospitalization:  No - Comment as needed Significant Relationships:  Spouse Lives with:  Spouse Do you feel safe going back to the place where you live?  Yes Need for family participation in patient care:  Yes (Comment)  Care giving concerns:  Wife reports she just wants the patient to receive the proper services he needs.    Social Worker assessment / plan:  CSW received consult. CSW went to speak with patient regarding the possible need for short term rehab. Patient is unable to be assessed at this time. CSW contacted patient's wife Ricky Molina via telephone at [phone]. CSW informed wife of PT recommendation for short term rehab or long term acute care. Wife is agreeable with current recommendation and expressed she wants the patient to get the care he needs. Wife reports she is disabled and has not been able to visit the patient much. However, wife provided permission to fax patient's clinical information out to the facilities in [facility]. Wife reports they are familiar with [facility] and this would be the family's preference. CSW has completed FL2 for MD signature. CSW to initiate SNF placement process. [place] number submitted via [employer]. Awaiting response by reviewing RN.      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 [facility] offers to be provided via telephone once available. )  Patient/Family's Response to care:  Wife is agreeable with current recommendations.   Patient/Family's Understanding of and Emotional Response to Diagnosis, Current Treatment, and Prognosis: Wife was very cooperative with CSW assessment. Wife is concerned about patient's well-being and expressed that he does not need to be discharge yet. CSW informed wife that patient is not medically stable, however it would be good for CSW to initiate placement process. Wife is hopeful for patient's progress and return back home. Wife is appreciative of CSW intervention.    Emotional Assessment Appearance:  Appears stated age Attitude/Demeanor/Rapport:  Unable to Assess Affect (typically observed):  Unable to Assess Orientation:   (Unable to assess) Alcohol / Substance use:  Alcohol Use (Wife reports patient drinks a pint or a 5th of alcohol a day. Wife reports patient likes to drink the airplane bottles as well.) Psych involvement (Current and /or in the community):  Yes (Comment)  Discharge Needs  Concerns to be addressed:  Decision making concerns, Discharge Planning Concerns Readmission within the last 30 days:  No Current discharge risk:  Other (Requires further medical work up) Barriers to Discharge:  Continued Medical Work up  Loleta Dicker, LCSW 03/27/2015, 11:10 AM

## 2015-03-28 ENCOUNTER — Inpatient Hospital Stay (HOSPITAL_COMMUNITY): Payer: BLUE CROSS/BLUE SHIELD

## 2015-03-28 LAB — BLOOD GAS, ARTERIAL
ACID-BASE EXCESS: 5.2 mmol/L — AB (ref 0.0–2.0)
Bicarbonate: 29.5 mEq/L — ABNORMAL HIGH (ref 20.0–24.0)
DRAWN BY: 437071
FIO2: 0.8
LHR: 18 {breaths}/min
O2 Saturation: 98.7 %
PCO2 ART: 46.3 mmHg — AB (ref 35.0–45.0)
PEEP/CPAP: 8 cmH2O
PH ART: 7.421 (ref 7.350–7.450)
Patient temperature: 98.6
TCO2: 30.9 mmol/L (ref 0–100)
pO2, Arterial: 120 mmHg — ABNORMAL HIGH (ref 80.0–100.0)

## 2015-03-28 LAB — BASIC METABOLIC PANEL
Anion gap: 8 (ref 5–15)
BUN: 15 mg/dL (ref 6–20)
CHLORIDE: 113 mmol/L — AB (ref 101–111)
CO2: 30 mmol/L (ref 22–32)
Calcium: 8.4 mg/dL — ABNORMAL LOW (ref 8.9–10.3)
Creatinine, Ser: 0.57 mg/dL — ABNORMAL LOW (ref 0.61–1.24)
GFR calc Af Amer: >60 mL/min (ref >60)
GLUCOSE: 231 mg/dL — AB (ref 65–99)
POTASSIUM: 4.3 mmol/L (ref 3.5–5.1)
Sodium: 151 mmol/L — ABNORMAL HIGH (ref 135–145)

## 2015-03-28 LAB — GLUCOSE, CAPILLARY
GLUCOSE-CAPILLARY: 162 mg/dL — AB (ref 65–99)
GLUCOSE-CAPILLARY: 177 mg/dL — AB (ref 65–99)
Glucose-Capillary: 168 mg/dL — ABNORMAL HIGH (ref 65–99)
Glucose-Capillary: 185 mg/dL — ABNORMAL HIGH (ref 65–99)
Glucose-Capillary: 137 mg/dL — ABNORMAL HIGH (ref 65–99)
Glucose-Capillary: 136 mg/dL — ABNORMAL HIGH (ref 65–99)
Glucose-Capillary: 146 mg/dL — ABNORMAL HIGH (ref 65–99)

## 2015-03-28 LAB — CBC
HCT: 34.4 % — ABNORMAL LOW (ref 39.0–52.0)
Hemoglobin: 10.4 g/dL — ABNORMAL LOW (ref 13.0–17.0)
MCH: 35.3 pg — AB (ref 26.0–34.0)
MCHC: 30.2 g/dL (ref 30.0–36.0)
MCV: 116.6 fL — ABNORMAL HIGH (ref 78.0–100.0)
PLATELETS: 91 10*3/uL — AB (ref 150–400)
RBC: 2.95 MIL/uL — ABNORMAL LOW (ref 4.22–5.81)
RDW: 19.9 % — ABNORMAL HIGH (ref 11.5–15.5)
WBC: 5.9 10*3/uL (ref 4.0–10.5)

## 2015-03-28 LAB — AMMONIA: Ammonia: 76 umol/L — ABNORMAL HIGH (ref 9–35)

## 2015-03-28 MED ORDER — JEVITY 1.5 CAL/FIBER PO LIQD
1000.0000 mL | ORAL | Status: DC
  Administered 2015-03-28: 1000 mL
  Filled 2015-03-28 (×3): qty 1000

## 2015-03-28 MED ORDER — PRO-STAT SUGAR FREE PO LIQD
60.0000 mL | Freq: Two times a day (BID) | ORAL | Status: DC
  Administered 2015-03-28 – 2015-03-30 (×4): 60 mL
  Filled 2015-03-28 (×4): qty 60

## 2015-03-28 MED ORDER — WHITE PETROLATUM GEL
Status: AC
  Administered 2015-03-28: 0.2
  Filled 2015-03-28: qty 1
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
## 2015-03-28 NOTE — Clinical Social Work Note (Signed)
BSW intern spoke with patient wife over phone to extend patient bed offers. Patient wife expressed that she wanted patient to stay in a [facility]. BSW intern gave bed offers to patient wife and patient wife stated that she wanted to tour the facilities before she made a final decision. Patient wife also had some questions about insurance that BSW intern could not provide answers. Social worker will continue to follow and assist as needed.  Jenita Seashore BSW Intern, [phone]
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
## 2015-03-29 LAB — COMPREHENSIVE METABOLIC PANEL
ALBUMIN: 1.8 g/dL — AB (ref 3.5–5.0)
ALK PHOS: 87 U/L (ref 38–126)
ALT: 93 U/L — AB (ref 17–63)
ANION GAP: 4 — AB (ref 5–15)
AST: 189 U/L — ABNORMAL HIGH (ref 15–41)
BUN: 16 mg/dL (ref 6–20)
CHLORIDE: 114 mmol/L — AB (ref 101–111)
CO2: 31 mmol/L (ref 22–32)
CREATININE: 0.56 mg/dL — AB (ref 0.61–1.24)
Calcium: 8.2 mg/dL — ABNORMAL LOW (ref 8.9–10.3)
GFR calc non Af Amer: >60 mL/min (ref >60)
GLUCOSE: 225 mg/dL — AB (ref 65–99)
Potassium: 3.8 mmol/L (ref 3.5–5.1)
SODIUM: 149 mmol/L — AB (ref 135–145)
TOTAL PROTEIN: 6.9 g/dL (ref 6.5–8.1)
Total Bilirubin: 4.3 mg/dL — ABNORMAL HIGH (ref 0.3–1.2)

## 2015-03-29 LAB — GLUCOSE, CAPILLARY
GLUCOSE-CAPILLARY: 145 mg/dL — AB (ref 65–99)
Glucose-Capillary: 139 mg/dL — ABNORMAL HIGH (ref 65–99)
Glucose-Capillary: 144 mg/dL — ABNORMAL HIGH (ref 65–99)
Glucose-Capillary: 167 mg/dL — ABNORMAL HIGH (ref 65–99)
Glucose-Capillary: 168 mg/dL — ABNORMAL HIGH (ref 65–99)

## 2015-03-29 LAB — AMMONIA: Ammonia: 47 umol/L — ABNORMAL HIGH (ref 9–35)

## 2015-03-29 LAB — PREALBUMIN: Prealbumin: 4.8 mg/dL — ABNORMAL LOW (ref 18–38)

## 2015-03-29 MED ORDER — ENOXAPARIN SODIUM 40 MG/0.4ML ~~LOC~~ SOLN
40.0000 mg | Freq: Two times a day (BID) | SUBCUTANEOUS | Status: DC
  Administered 2015-03-29 – 2015-04-04 (×14): 40 mg via SUBCUTANEOUS
  Filled 2015-03-29 (×14): qty 0.4

## 2015-03-29 MED ORDER — LACTULOSE 10 GM/15ML PO SOLN
15.0000 g | Freq: Three times a day (TID) | ORAL | Status: DC
  Administered 2015-03-29 – 2015-04-01 (×12): 15 g
  Filled 2015-03-29 (×12): qty 30

## 2015-03-29 MED ORDER — LOPERAMIDE HCL 1 MG/5ML PO LIQD
2.0000 mg | ORAL | Status: DC
  Administered 2015-03-29 – 2015-04-03 (×26): 2 mg
  Filled 2015-03-29 (×37): qty 10

## 2015-03-29 MED ORDER — VITAL HIGH PROTEIN PO LIQD
1000.0000 mL | ORAL | Status: DC
  Administered 2015-03-29: 1000 mL
  Filled 2015-03-29 (×3): qty 1000

## 2015-03-29 NOTE — Progress Notes (Signed)
Chaplain provided follow up spiritual care support for this patient. He was awake at the time of this visit,  Chaplain reminded him of a prior visit with him, voiced on going spiritual care support for him/ Chaplain Janell Quiet [phone]
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
## 2015-03-30 LAB — GLUCOSE, CAPILLARY
GLUCOSE-CAPILLARY: 141 mg/dL — AB (ref 65–99)
GLUCOSE-CAPILLARY: 134 mg/dL — AB (ref 65–99)
GLUCOSE-CAPILLARY: 166 mg/dL — AB (ref 65–99)
GLUCOSE-CAPILLARY: 152 mg/dL — AB (ref 65–99)
GLUCOSE-CAPILLARY: 125 mg/dL — AB (ref 65–99)
Glucose-Capillary: 112 mg/dL — ABNORMAL HIGH (ref 65–99)

## 2015-03-30 LAB — AMMONIA: Ammonia: 53 umol/L — ABNORMAL HIGH (ref 9–35)

## 2015-03-30 MED ORDER — VITAL AF 1.2 CAL PO LIQD
1000.0000 mL | ORAL | Status: DC
  Administered 2015-03-30 – 2015-04-01 (×3): 1000 mL
  Filled 2015-03-30 (×3): qty 1000
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
## 2015-03-31 LAB — GLUCOSE, CAPILLARY
GLUCOSE-CAPILLARY: 120 mg/dL — AB (ref 65–99)
GLUCOSE-CAPILLARY: 145 mg/dL — AB (ref 65–99)
GLUCOSE-CAPILLARY: 184 mg/dL — AB (ref 65–99)
Glucose-Capillary: 129 mg/dL — ABNORMAL HIGH (ref 65–99)
Glucose-Capillary: 145 mg/dL — ABNORMAL HIGH (ref 65–99)
Glucose-Capillary: 155 mg/dL — ABNORMAL HIGH (ref 65–99)
Glucose-Capillary: 177 mg/dL — ABNORMAL HIGH (ref 65–99)
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
## 2015-04-01 ENCOUNTER — Inpatient Hospital Stay (HOSPITAL_COMMUNITY): Payer: BLUE CROSS/BLUE SHIELD

## 2015-04-01 LAB — BLOOD GAS, ARTERIAL
Acid-Base Excess: 8.7 mmol/L — ABNORMAL HIGH (ref 0.0–2.0)
Bicarbonate: 33.6 mEq/L — ABNORMAL HIGH (ref 20.0–24.0)
DRAWN BY: 42624
O2 CONTENT: 5 L/min
O2 Saturation: 94.6 %
PH ART: 7.404 (ref 7.350–7.450)
Patient temperature: 98.6
TCO2: 35.3 mmol/L (ref 0–100)
pCO2 arterial: 54.9 mmHg — ABNORMAL HIGH (ref 35.0–45.0)
pO2, Arterial: 77.5 mmHg — ABNORMAL LOW (ref 80.0–100.0)

## 2015-04-01 LAB — GLUCOSE, CAPILLARY
GLUCOSE-CAPILLARY: 161 mg/dL — AB (ref 65–99)
GLUCOSE-CAPILLARY: 168 mg/dL — AB (ref 65–99)
Glucose-Capillary: 182 mg/dL — ABNORMAL HIGH (ref 65–99)
Glucose-Capillary: 169 mg/dL — ABNORMAL HIGH (ref 65–99)
Glucose-Capillary: 175 mg/dL — ABNORMAL HIGH (ref 65–99)

## 2015-04-01 MED ORDER — SODIUM CHLORIDE 0.9 % IV BOLUS (SEPSIS)
1000.0000 mL | Freq: Once | INTRAVENOUS | Status: AC
  Administered 2015-04-02: 1000 mL via INTRAVENOUS

## 2015-04-01 MED ORDER — SODIUM CHLORIDE 0.9 % IV SOLN
2000.0000 mg | INTRAVENOUS | Status: AC
  Administered 2015-04-02: 2000 mg via INTRAVENOUS
  Filled 2015-04-01: qty 2000

## 2015-04-01 MED ORDER — PIPERACILLIN-TAZOBACTAM 3.375 G IVPB
3.3750 g | Freq: Three times a day (TID) | INTRAVENOUS | Status: DC
  Administered 2015-04-02 – 2015-04-06 (×13): 3.375 g via INTRAVENOUS
  Filled 2015-04-01 (×15): qty 50

## 2015-04-01 MED ORDER — PIPERACILLIN-TAZOBACTAM 3.375 G IVPB 30 MIN
3.3750 g | INTRAVENOUS | Status: AC
  Administered 2015-04-02: 3.375 g via INTRAVENOUS
  Filled 2015-04-01: qty 50

## 2015-04-01 MED ORDER — FENTANYL CITRATE (PF) 100 MCG/2ML IJ SOLN
100.0000 ug | Freq: Once | INTRAMUSCULAR | Status: AC
  Administered 2015-04-01: 100 ug via INTRAVENOUS

## 2015-04-01 MED ORDER — FENTANYL CITRATE (PF) 100 MCG/2ML IJ SOLN
INTRAMUSCULAR | Status: AC
  Filled 2015-04-01: qty 2

## 2015-04-01 MED ORDER — VANCOMYCIN HCL 10 G IV SOLR
1250.0000 mg | Freq: Three times a day (TID) | INTRAVENOUS | Status: DC
  Administered 2015-04-02 – 2015-04-03 (×4): 1250 mg via INTRAVENOUS
  Filled 2015-04-01 (×5): qty 1250

## 2015-04-01 MED ORDER — ETOMIDATE 2 MG/ML IV SOLN
0.3000 mg/kg | Freq: Once | INTRAVENOUS | Status: AC
  Administered 2015-04-01: 40 mg via INTRAVENOUS

## 2015-04-01 MED ORDER — MIDAZOLAM HCL 2 MG/2ML IJ SOLN
INTRAMUSCULAR | Status: AC
  Administered 2015-04-02: 6 mg
  Filled 2015-04-01: qty 6

## 2015-04-01 NOTE — Progress Notes (Signed)
Called respiratory therapist to assess patient breathing and airway. Patient noted to be more less responsive, has labored breathing and very congested.

## 2015-04-01 NOTE — Progress Notes (Signed)
Called  Dr. Harlon Flor and ordered to transfer patient to ICU. Report called to RN. Patient transferred to 2S01.

## 2015-04-01 NOTE — Progress Notes (Signed)
Notified Dr. Harlon Flor of patient's condition, order received.

## 2015-04-01 NOTE — Progress Notes (Signed)
Patient showed signs of increasing agitation this morning through this afternoon, two separate doses of Versed were given for agitation.  He was calling for his wife, trying to get up, and not following commands.  Medication seemed to help a great deal.

## 2015-04-01 NOTE — Significant Event (Signed)
Rapid Response Event Note Called by RT to assess pt for increased RR & gurgling.  Overview: Time Called: 2245 Arrival Time: 2247 Event Type: Respiratory, Neurologic  Initial Focused Assessment:  Per primary RN the pt has had decreasing LOC all day and is almost obtunded.  Pt is unable to cough to clear secretions & has upper airway gurgling.  Prior NTS attempt yielded frank red blood.  RR 35 sats 84% on 6L [place], & profusely diaphoretic.  Pt has a very large firm abd r/t liver failure.  Last ammonia level 2 days ago was in the 50s.  Discussed concerns with Dr. Harlon Flor and pt to tx to ICU.    Interventions: Ammonia level ABG  Event Summary: Dr. Harlon Flor updated & orders rcvd   at  2255    at          [facility], Ricky Molina
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
## 2015-04-01 NOTE — Procedures (Signed)
Called to assess pt's WOB.  Pt has labored breathing with rhonchi and coarse crackles noted for breath sounds.  Treatment started then stopped midway due to increase HR and RR.  RT NTS pt's right nostril.  RT obtained small amount of yellow thick secretions then obtain frank amount of blood.  NTS stopped. Pt's RN notified and Rapid RN notified.

## 2015-04-01 NOTE — Clinical Social Work Note (Signed)
Patient wife provided with bed offers, however patient still continues without a permanent means of feeding. Patient has pulled out several feeding tubes and presents with a high medical complexity for PEG tube placement. Patient may need to have new bed search initiated once patient with a permanent means of feeding. CSW remains available to facilitate patient discharge needs once medically stable.  Macario Golds, [place] [phone]
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
## 2015-04-01 NOTE — Progress Notes (Signed)
Rapid response called by RT and in the room to assess patient.

## 2015-04-02 DIAGNOSIS — Z515 Encounter for palliative care: Secondary | ICD-10-CM | POA: Insufficient documentation

## 2015-04-02 DIAGNOSIS — Z7189 Other specified counseling: Secondary | ICD-10-CM | POA: Insufficient documentation

## 2015-04-02 DIAGNOSIS — E7220 Disorder of urea cycle metabolism, unspecified: Secondary | ICD-10-CM

## 2015-04-02 DIAGNOSIS — J9601 Acute respiratory failure with hypoxia: Secondary | ICD-10-CM

## 2015-04-02 DIAGNOSIS — G9340 Encephalopathy, unspecified: Secondary | ICD-10-CM

## 2015-04-02 LAB — GRAM STAIN

## 2015-04-02 LAB — COMPREHENSIVE METABOLIC PANEL
ALBUMIN: 1.7 g/dL — AB (ref 3.5–5.0)
ALK PHOS: 88 U/L (ref 38–126)
ALT: 106 U/L — ABNORMAL HIGH (ref 17–63)
ANION GAP: 9 (ref 5–15)
AST: 204 U/L — ABNORMAL HIGH (ref 15–41)
BILIRUBIN TOTAL: 4.4 mg/dL — AB (ref 0.3–1.2)
BUN: 13 mg/dL (ref 6–20)
CALCIUM: 7.7 mg/dL — AB (ref 8.9–10.3)
CO2: 29 mmol/L (ref 22–32)
Chloride: 112 mmol/L — ABNORMAL HIGH (ref 101–111)
Creatinine, Ser: 0.49 mg/dL — ABNORMAL LOW (ref 0.61–1.24)
GFR calc Af Amer: >60 mL/min (ref >60)
GLUCOSE: 174 mg/dL — AB (ref 65–99)
Potassium: 3.8 mmol/L (ref 3.5–5.1)
Sodium: 150 mmol/L — ABNORMAL HIGH (ref 135–145)
TOTAL PROTEIN: 6.8 g/dL (ref 6.5–8.1)

## 2015-04-02 LAB — POCT I-STAT 3, ART BLOOD GAS (G3+)
Acid-Base Excess: 6 mmol/L — ABNORMAL HIGH (ref 0.0–2.0)
Bicarbonate: 31.4 mEq/L — ABNORMAL HIGH (ref 20.0–24.0)
O2 SAT: 92 %
PH ART: 7.392 (ref 7.350–7.450)
TCO2: 33 mmol/L (ref 0–100)
pCO2 arterial: 51.7 mmHg — ABNORMAL HIGH (ref 35.0–45.0)
pO2, Arterial: 66 mmHg — ABNORMAL LOW (ref 80.0–100.0)

## 2015-04-02 LAB — GLUCOSE, CAPILLARY
GLUCOSE-CAPILLARY: 76 mg/dL (ref 65–99)
Glucose-Capillary: 162 mg/dL — ABNORMAL HIGH (ref 65–99)
Glucose-Capillary: 182 mg/dL — ABNORMAL HIGH (ref 65–99)
Glucose-Capillary: 127 mg/dL — ABNORMAL HIGH (ref 65–99)

## 2015-04-02 LAB — CBC WITH DIFFERENTIAL/PLATELET
BASOS PCT: 1 %
Basophils Absolute: 0.1 10*3/uL (ref 0.0–0.1)
EOS PCT: 6 %
Eosinophils Absolute: 0.4 10*3/uL (ref 0.0–0.7)
HEMATOCRIT: 35.4 % — AB (ref 39.0–52.0)
HEMOGLOBIN: 10.2 g/dL — AB (ref 13.0–17.0)
LYMPHS ABS: 1.5 10*3/uL (ref 0.7–4.0)
LYMPHS PCT: 22 %
MCH: 32.8 pg (ref 26.0–34.0)
MCHC: 28.8 g/dL — AB (ref 30.0–36.0)
MCV: 113.8 fL — AB (ref 78.0–100.0)
MONOS PCT: 6 %
Monocytes Absolute: 0.4 10*3/uL (ref 0.1–1.0)
NEUTROS ABS: 4.2 10*3/uL (ref 1.7–7.7)
Neutrophils Relative %: 65 %
Platelets: 73 10*3/uL — ABNORMAL LOW (ref 150–400)
RBC: 3.11 MIL/uL — ABNORMAL LOW (ref 4.22–5.81)
RDW: 18 % — ABNORMAL HIGH (ref 11.5–15.5)
WBC: 6.6 10*3/uL (ref 4.0–10.5)

## 2015-04-02 LAB — BODY FLUID CELL COUNT WITH DIFFERENTIAL
Eos, Fluid: 0 %
Lymphs, Fluid: 66 %
Monocyte-Macrophage-Serous Fluid: 15 % — ABNORMAL LOW (ref 50–90)
Neutrophil Count, Fluid: 19 % (ref 0–25)
Total Nucleated Cell Count, Fluid: 103 cu mm (ref 0–1000)

## 2015-04-02 LAB — AMMONIA: Ammonia: 80 umol/L — ABNORMAL HIGH (ref 9–35)

## 2015-04-02 LAB — LACTATE DEHYDROGENASE, PLEURAL OR PERITONEAL FLUID: LD FL: 52 U/L — AB (ref 3–23)

## 2015-04-02 LAB — PHOSPHORUS: PHOSPHORUS: 2.1 mg/dL — AB (ref 2.5–4.6)

## 2015-04-02 LAB — PROTIME-INR
INR: 1.45 (ref 0.00–1.49)
Prothrombin Time: 17.7 seconds — ABNORMAL HIGH (ref 11.6–15.2)

## 2015-04-02 LAB — MAGNESIUM: Magnesium: 1.9 mg/dL (ref 1.7–2.4)

## 2015-04-02 LAB — ALBUMIN, FLUID (OTHER): Albumin, Fluid: <1 g/dL

## 2015-04-02 LAB — LACTIC ACID, PLASMA
LACTIC ACID, VENOUS: 1.8 mmol/L (ref 0.5–2.0)
LACTIC ACID, VENOUS: 1.8 mmol/L (ref 0.5–2.0)

## 2015-04-02 LAB — LACTATE DEHYDROGENASE: LDH: 307 U/L — ABNORMAL HIGH (ref 98–192)

## 2015-04-02 LAB — APTT: APTT: 45 s — AB (ref 24–37)

## 2015-04-02 MED ORDER — FENTANYL CITRATE (PF) 100 MCG/2ML IJ SOLN
100.0000 ug | Freq: Once | INTRAMUSCULAR | Status: DC
  Filled 2015-04-02: qty 2

## 2015-04-02 MED ORDER — FENTANYL BOLUS VIA INFUSION
50.0000 ug | INTRAVENOUS | Status: DC | PRN
Start: 2015-04-02 — End: 2015-04-02
  Filled 2015-04-02: qty 50

## 2015-04-02 MED ORDER — FENTANYL CITRATE (PF) 100 MCG/2ML IJ SOLN
INTRAMUSCULAR | Status: AC
  Administered 2015-04-02: 100 ug
  Filled 2015-04-02: qty 2

## 2015-04-02 MED ORDER — ACETAMINOPHEN 160 MG/5ML PO SOLN
650.0000 mg | ORAL | Status: DC | PRN
  Administered 2015-04-02: 650 mg
  Filled 2015-04-02: qty 20.3

## 2015-04-02 MED ORDER — VITAL HIGH PROTEIN PO LIQD
1000.0000 mL | ORAL | Status: DC
  Administered 2015-04-02 – 2015-04-03 (×2): 1000 mL

## 2015-04-02 MED ORDER — ALTEPLASE 2 MG IJ SOLR
2.0000 mg | Freq: Once | INTRAMUSCULAR | Status: AC
  Administered 2015-04-02: 2 mg
  Filled 2015-04-02: qty 2

## 2015-04-02 MED ORDER — FAMOTIDINE IN NACL 20-0.9 MG/50ML-% IV SOLN
20.0000 mg | Freq: Two times a day (BID) | INTRAVENOUS | Status: DC
  Administered 2015-04-02 – 2015-04-04 (×5): 20 mg via INTRAVENOUS
  Filled 2015-04-02 (×5): qty 50

## 2015-04-02 MED ORDER — FENTANYL CITRATE (PF) 100 MCG/2ML IJ SOLN: 50.0000 ug | Freq: Once | INTRAMUSCULAR | Status: DC

## 2015-04-02 MED ORDER — FENTANYL CITRATE (PF) 100 MCG/2ML IJ SOLN
100.0000 ug | INTRAMUSCULAR | Status: DC | PRN
  Filled 2015-04-02: qty 2

## 2015-04-02 MED ORDER — FENTANYL CITRATE (PF) 100 MCG/2ML IJ SOLN
100.0000 ug | INTRAMUSCULAR | Status: DC | PRN
  Administered 2015-04-02 – 2015-04-08 (×22): 100 ug via INTRAVENOUS
  Filled 2015-04-02 (×21): qty 2

## 2015-04-02 MED ORDER — ANTISEPTIC ORAL RINSE SOLUTION (CORINZ): 7.0000 mL | Freq: Four times a day (QID) | OROMUCOSAL | Status: DC

## 2015-04-02 MED ORDER — SODIUM CHLORIDE 0.9 % IV SOLN
INTRAVENOUS | Status: DC | PRN
  Administered 2015-04-09: 22:00:00 via INTRA_ARTERIAL

## 2015-04-02 MED ORDER — PHENYLEPHRINE HCL 10 MG/ML IJ SOLN
100.0000 ug/min | INTRAVENOUS | Status: DC
  Administered 2015-04-02 – 2015-04-04 (×4): 100 ug/min via INTRAVENOUS
  Administered 2015-04-04: 30 ug/min via INTRAVENOUS
  Filled 2015-04-02 (×11): qty 4

## 2015-04-02 MED ORDER — VITAL AF 1.2 CAL PO LIQD
1000.0000 mL | ORAL | Status: DC
  Administered 2015-04-02: 1000 mL

## 2015-04-02 MED ORDER — FENTANYL CITRATE (PF) 100 MCG/2ML IJ SOLN
100.0000 ug | INTRAMUSCULAR | Status: DC | PRN
  Administered 2015-04-02 (×2): 100 ug via INTRAVENOUS
  Filled 2015-04-02: qty 2

## 2015-04-02 MED ORDER — CHLORHEXIDINE GLUCONATE 0.12% ORAL RINSE (MEDLINE KIT): 15.0000 mL | Freq: Two times a day (BID) | OROMUCOSAL | Status: DC

## 2015-04-02 MED ORDER — SODIUM CHLORIDE 0.9 % IV SOLN
25.0000 ug/h | INTRAVENOUS | Status: DC
  Administered 2015-04-02: 50 ug/h via INTRAVENOUS
  Filled 2015-04-02: qty 50

## 2015-04-02 MED ORDER — LACTULOSE 10 GM/15ML PO SOLN
30.0000 g | ORAL | Status: DC
  Administered 2015-04-02 – 2015-04-04 (×13): 30 g via ORAL
  Filled 2015-04-02 (×16): qty 45

## 2015-04-02 MED ORDER — INSULIN GLARGINE 100 UNIT/ML ~~LOC~~ SOLN
15.0000 [IU] | Freq: Two times a day (BID) | SUBCUTANEOUS | Status: DC
  Administered 2015-04-03 – 2015-04-09 (×14): 15 [IU] via SUBCUTANEOUS
  Filled 2015-04-02 (×15): qty 0.15
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
## 2015-04-02 NOTE — Procedures (Signed)
INTUBATION PROCEDURE NOTE  Indication: Acute encephalopathy/airway protection Consent: Emergent Time Out: yes Medications: Etomidate, versed, fentanyl, succinylcholine Paralytic/RSI: yes Technique: DL Blade: MAC 4 Cords Visualized: yes View: 1 # of attempts: 1 Tube confirmation:   Chest rise: yes  Bilateral Breath Sounds: yes  Color change on CO2 detector: yes  ETCO2: yes  CXR: tube appropriate Successful placement: yes    Galvin Proffer, DO., MS [facility]/Critical [facility]
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
## 2015-04-02 NOTE — Procedures (Addendum)
DIAGNOSTIC ABDOMINAL PARACENTESIS Date/Time: 04/02/2015, 1:12 AM Performed by: Caryl Ada, DO with assistance of Cleone Slim, MD  Consent: Emergent Consent Obtained  Paracentesis was performed with a 19 gauge needle-cath placed into the peritoneal space in the left lower abdomen.   Specimen:  Approximately 2.5L of  ascitic fluid were removed.  Complications:  None.  Site marked: the operative site was marked Patient identity confirmed: verbally with arm band Time out: Immediately prior to procedure a "time out" was called to verify the correct patient, procedure, equipment, support staff and site/side marked as required.  Preparation: Patient was prepped and draped in the usual sterile fashion. Local anesthesia used: yes Ultrasound guidance: no, ultrasound used pre-procedure Anesthesia: local infiltration Local anesthetic: lidocaine 1% without epinephrine Anesthetic total: 5 ml Patient sedated: yes (residual anesthesia from induction doses for intubation) Patient tolerance: Patient tolerated the procedure well with no immediate complications  Disposition:  Patient stable in SDU.   Caryl Ada, DO 04/02/2015, 1:15 AM PGY-2, [facility]  I was present for the entire procedure and assisted where necessary.  Ultrasound was used to locate and mark the site of the procedure.
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
## 2015-04-02 NOTE — Procedures (Addendum)
Arterial Catheter Insertion Procedure Note Ricky Molina [id number] 1965-06-24  Procedure: Insertion of Arterial Catheter  Indications: Blood pressure monitoring and Frequent blood sampling  Procedure Details Consent: Unable to obtain consent because of altered level of consciousness. Pt sedated on ventilator. Arterial Line Medically Necessary. Time Out: Verified patient identification, verified procedure, site/side was marked, verified correct patient position, special equipment/implants available, medications/allergies/relevent history reviewed, required imaging and test results available.  Performed  Maximum sterile technique was used including antiseptics, cap, gloves, gown, hand hygiene, mask and sheet. Skin prep: Chlorhexidine; local anesthetic administered 22 gauge catheter was inserted into left radial artery using the Seldinger technique.  Evaluation Blood flow good; BP tracing good. Complications: No apparent complications.   Antoine Poche 04/02/2015

## 2015-04-03 LAB — CBC
HEMATOCRIT: 33.9 % — AB (ref 39.0–52.0)
HEMOGLOBIN: 10.1 g/dL — AB (ref 13.0–17.0)
MCH: 33.3 pg (ref 26.0–34.0)
MCHC: 29.8 g/dL — ABNORMAL LOW (ref 30.0–36.0)
MCV: 111.9 fL — ABNORMAL HIGH (ref 78.0–100.0)
Platelets: 71 10*3/uL — ABNORMAL LOW (ref 150–400)
RBC: 3.03 MIL/uL — AB (ref 4.22–5.81)
RDW: 18.4 % — ABNORMAL HIGH (ref 11.5–15.5)
WBC: 10.4 10*3/uL (ref 4.0–10.5)

## 2015-04-03 LAB — VANCOMYCIN, TROUGH: Vancomycin Tr: 50 ug/mL (ref 10.0–20.0)

## 2015-04-03 LAB — COMPREHENSIVE METABOLIC PANEL
ALBUMIN: 1.5 g/dL — AB (ref 3.5–5.0)
ALK PHOS: 74 U/L (ref 38–126)
ALT: 111 U/L — ABNORMAL HIGH (ref 17–63)
ANION GAP: 11 (ref 5–15)
AST: 225 U/L — ABNORMAL HIGH (ref 15–41)
BUN: 31 mg/dL — ABNORMAL HIGH (ref 6–20)
CALCIUM: 7.3 mg/dL — AB (ref 8.9–10.3)
CO2: 25 mmol/L (ref 22–32)
Chloride: 114 mmol/L — ABNORMAL HIGH (ref 101–111)
Creatinine, Ser: 2.61 mg/dL — ABNORMAL HIGH (ref 0.61–1.24)
GFR calc non Af Amer: 27 mL/min — ABNORMAL LOW (ref >60)
GFR, EST AFRICAN AMERICAN: 31 mL/min — AB (ref >60)
Glucose, Bld: 155 mg/dL — ABNORMAL HIGH (ref 65–99)
POTASSIUM: 3.3 mmol/L — AB (ref 3.5–5.1)
SODIUM: 150 mmol/L — AB (ref 135–145)
TOTAL PROTEIN: 6.6 g/dL (ref 6.5–8.1)
Total Bilirubin: 5.8 mg/dL — ABNORMAL HIGH (ref 0.3–1.2)

## 2015-04-03 LAB — POCT I-STAT 3, ART BLOOD GAS (G3+)
ACID-BASE EXCESS: 5 mmol/L — AB (ref 0.0–2.0)
Bicarbonate: 28.1 mEq/L — ABNORMAL HIGH (ref 20.0–24.0)
O2 SAT: 95 %
PH ART: 7.504 — AB (ref 7.350–7.450)
Patient temperature: 99
TCO2: 29 mmol/L (ref 0–100)
pCO2 arterial: 35.9 mmHg (ref 35.0–45.0)
pO2, Arterial: 70 mmHg — ABNORMAL LOW (ref 80.0–100.0)

## 2015-04-03 LAB — GLUCOSE, CAPILLARY
GLUCOSE-CAPILLARY: 106 mg/dL — AB (ref 65–99)
GLUCOSE-CAPILLARY: 106 mg/dL — AB (ref 65–99)
GLUCOSE-CAPILLARY: 124 mg/dL — AB (ref 65–99)
GLUCOSE-CAPILLARY: 127 mg/dL — AB (ref 65–99)
GLUCOSE-CAPILLARY: 131 mg/dL — AB (ref 65–99)
GLUCOSE-CAPILLARY: 132 mg/dL — AB (ref 65–99)
Glucose-Capillary: 146 mg/dL — ABNORMAL HIGH (ref 65–99)
Glucose-Capillary: 131 mg/dL — ABNORMAL HIGH (ref 65–99)

## 2015-04-03 MED ORDER — POTASSIUM CHLORIDE 10 MEQ/50ML IV SOLN
10.0000 meq | INTRAVENOUS | Status: AC
  Administered 2015-04-03 (×4): 10 meq via INTRAVENOUS
  Filled 2015-04-03: qty 50

## 2015-04-03 NOTE — Progress Notes (Signed)
Expired Fentanyl gtt wasted, rinsed down sink with witness of second RN, [employer].

## 2015-04-03 NOTE — Progress Notes (Signed)
Blood culture drawn 04/02/2015 resulted gram positive rods. Dr Chales Abrahams notified.

## 2015-04-03 NOTE — Progress Notes (Signed)
eLink Physician-Brief Progress Note Patient Name: Ricky Molina. DOB: 07-14-1965 MRN: [id number]   Date of Service  04/03/2015  HPI/Events of Note  Hypokalemia  eICU Interventions  Potassium replaced     Intervention Category Intermediate Interventions: Electrolyte abnormality - evaluation and management  DETERDING,ELIZABETH 04/03/2015, 5:17 AM
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
## 2015-04-04 ENCOUNTER — Inpatient Hospital Stay (HOSPITAL_COMMUNITY): Payer: BLUE CROSS/BLUE SHIELD

## 2015-04-04 LAB — COMPREHENSIVE METABOLIC PANEL
ALBUMIN: 1.4 g/dL — AB (ref 3.5–5.0)
ALK PHOS: 71 U/L (ref 38–126)
ALT: 87 U/L — AB (ref 17–63)
ANION GAP: 9 (ref 5–15)
AST: 145 U/L — ABNORMAL HIGH (ref 15–41)
BILIRUBIN TOTAL: 5.1 mg/dL — AB (ref 0.3–1.2)
BUN: 49 mg/dL — ABNORMAL HIGH (ref 6–20)
CALCIUM: 7 mg/dL — AB (ref 8.9–10.3)
CO2: 22 mmol/L (ref 22–32)
CREATININE: 4.1 mg/dL — AB (ref 0.61–1.24)
Chloride: 112 mmol/L — ABNORMAL HIGH (ref 101–111)
GFR calc non Af Amer: 16 mL/min — ABNORMAL LOW (ref >60)
GFR, EST AFRICAN AMERICAN: 18 mL/min — AB (ref >60)
GLUCOSE: 137 mg/dL — AB (ref 65–99)
Potassium: 3.2 mmol/L — ABNORMAL LOW (ref 3.5–5.1)
Sodium: 143 mmol/L (ref 135–145)
TOTAL PROTEIN: 6.5 g/dL (ref 6.5–8.1)

## 2015-04-04 LAB — CBC
HEMATOCRIT: 32.7 % — AB (ref 39.0–52.0)
HEMOGLOBIN: 9.8 g/dL — AB (ref 13.0–17.0)
MCH: 32.9 pg (ref 26.0–34.0)
MCHC: 30 g/dL (ref 30.0–36.0)
MCV: 109.7 fL — ABNORMAL HIGH (ref 78.0–100.0)
Platelets: 78 10*3/uL — ABNORMAL LOW (ref 150–400)
RBC: 2.98 MIL/uL — ABNORMAL LOW (ref 4.22–5.81)
RDW: 18.1 % — ABNORMAL HIGH (ref 11.5–15.5)
WBC: 8.8 10*3/uL (ref 4.0–10.5)

## 2015-04-04 LAB — GLUCOSE, CAPILLARY
GLUCOSE-CAPILLARY: 109 mg/dL — AB (ref 65–99)
GLUCOSE-CAPILLARY: 118 mg/dL — AB (ref 65–99)
Glucose-Capillary: 117 mg/dL — ABNORMAL HIGH (ref 65–99)
Glucose-Capillary: 119 mg/dL — ABNORMAL HIGH (ref 65–99)
Glucose-Capillary: 106 mg/dL — ABNORMAL HIGH (ref 65–99)
Glucose-Capillary: 111 mg/dL — ABNORMAL HIGH (ref 65–99)
Glucose-Capillary: 108 mg/dL — ABNORMAL HIGH (ref 65–99)

## 2015-04-04 LAB — CULTURE, RESPIRATORY

## 2015-04-04 LAB — CULTURE, RESPIRATORY W GRAM STAIN: Culture: NORMAL

## 2015-04-04 MED ORDER — ALBUMIN HUMAN 5 % IV SOLN
12.5000 g | Freq: Once | INTRAVENOUS | Status: AC
  Administered 2015-04-04: 12.5 g via INTRAVENOUS
  Filled 2015-04-04: qty 250

## 2015-04-04 MED ORDER — POTASSIUM CHLORIDE 20 MEQ/15ML (10%) PO SOLN
40.0000 meq | Freq: Once | ORAL | Status: AC
  Administered 2015-04-04: 40 meq
  Filled 2015-04-04: qty 30

## 2015-04-04 MED ORDER — POTASSIUM CHLORIDE 10 MEQ/100ML IV SOLN
10.0000 meq | Freq: Once | INTRAVENOUS | Status: AC
  Administered 2015-04-04: 10 meq via INTRAVENOUS
  Filled 2015-04-04 (×2): qty 100

## 2015-04-04 MED ORDER — LACTULOSE 10 GM/15ML PO SOLN
30.0000 g | Freq: Three times a day (TID) | ORAL | Status: DC
  Administered 2015-04-04 – 2015-04-07 (×10): 30 g
  Administered 2015-04-07: 20 g
  Administered 2015-04-08 – 2015-04-09 (×5): 30 g
  Filled 2015-04-04 (×17): qty 45

## 2015-04-04 MED ORDER — LOPERAMIDE HCL 1 MG/5ML PO LIQD
2.0000 mg | Freq: Three times a day (TID) | ORAL | Status: DC
  Filled 2015-04-04: qty 10
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
## 2015-04-04 NOTE — Progress Notes (Signed)
[facility] ADULT ICU REPLACEMENT PROTOCOL FOR AM LAB REPLACEMENT ONLY  The patient does not apply for the [facility] Adult ICU Electrolyte Replacment Protocol based on the criteria listed below:   Is GFR >/= 40 ml/min? No.  Patient's GFR today is 16    Abnormal electrolyte(s): K3.2  If a panic level lab has been reported, has the CCM MD in charge been notified? Yes.  .   Physician:  A De-Dios, MD  [place] Nakayama 04/04/2015 6:30 AM

## 2015-04-04 NOTE — Progress Notes (Signed)
eLink Physician-Brief Progress Note Patient Name: Ricky Molina. DOB: 04-Dec-1965 MRN: [id number]   Date of Service  04/04/2015  HPI/Events of Note  K 3.2  eICU Interventions  Creat elevated but pt making urine. Will order 10 meqs x 1     Intervention Category Major Interventions: Acid-Base disturbance - evaluation and management  Louann Sjogren 04/04/2015, 6:55 AM
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
## 2015-04-04 NOTE — Progress Notes (Signed)
EEG completed; results pending.    

## 2015-04-04 NOTE — Progress Notes (Signed)
PT Cancellation Note  Patient Details Name: Ricky Molina. MRN: [id number] DOB: October 19, 1965   Cancelled Treatment:    Reason Eval/Treat Not Completed: Patient at procedure or test/unavailable Pt getting EEG. Will follow up as time allows.   Ricky Molina 04/04/2015, 9:43 AM Ricky Molina, PT, DPT [phone]

## 2015-04-04 NOTE — Procedures (Signed)
HPI:  50 year old with encephalopathy and hx of seizure.    TECHNICAL SUMMARY:  A multichannel referential and bipolar montage EEG using the standard international 10-20 system was performed on the patient described as lethargic and not following commands.  The dominant background activity consists of 6 hertz activity seen most prominantly over the posterior head region.  There were periods of the recording that are contaminated with myogenic artifact due to patient movement and suctioning of the patient.  ACTIVATION:  Photic stimulation and hyperventilation were not performed.  EPILEPTIFORM ACTIVITY:  There were no spikes, sharp waves or paroxysmal activity.  SLEEP:  Physiologic drowsiness and much stage II sleep was noted.  CARDIAC:  The EKG lead revealed a regular sinus rhythm.  IMPRESSION:  This is an abnormal EEG demonstrating a moderate diffuse slowing of electrocerebral activity.  This can be seen in a wide variety of encephalopathic state including those of a toxic, metabolic, or degenerative nature.  There were no focal, hemispheric, or lateralizing features.  No epileptiform activity was recorded.  When compared to the patient's prior EEG dated 03/14/2015, there was improvement in the background activity.

## 2015-04-05 ENCOUNTER — Inpatient Hospital Stay (HOSPITAL_COMMUNITY): Payer: BLUE CROSS/BLUE SHIELD

## 2015-04-05 DIAGNOSIS — L8990 Pressure ulcer of unspecified site, unspecified stage: Secondary | ICD-10-CM | POA: Insufficient documentation

## 2015-04-05 LAB — GLUCOSE, CAPILLARY
GLUCOSE-CAPILLARY: 112 mg/dL — AB (ref 65–99)
GLUCOSE-CAPILLARY: 88 mg/dL (ref 65–99)
Glucose-Capillary: 100 mg/dL — ABNORMAL HIGH (ref 65–99)
Glucose-Capillary: 120 mg/dL — ABNORMAL HIGH (ref 65–99)
Glucose-Capillary: 128 mg/dL — ABNORMAL HIGH (ref 65–99)
Glucose-Capillary: 128 mg/dL — ABNORMAL HIGH (ref 65–99)

## 2015-04-05 LAB — VANCOMYCIN, RANDOM: VANCOMYCIN RM: 48 ug/mL

## 2015-04-05 LAB — BASIC METABOLIC PANEL
ANION GAP: 12 (ref 5–15)
BUN: 60 mg/dL — ABNORMAL HIGH (ref 6–20)
CO2: 21 mmol/L — AB (ref 22–32)
CREATININE: 4.96 mg/dL — AB (ref 0.61–1.24)
Calcium: 7.3 mg/dL — ABNORMAL LOW (ref 8.9–10.3)
Chloride: 109 mmol/L (ref 101–111)
GFR, EST AFRICAN AMERICAN: 14 mL/min — AB (ref >60)
GFR, EST NON AFRICAN AMERICAN: 12 mL/min — AB (ref >60)
Glucose, Bld: 137 mg/dL — ABNORMAL HIGH (ref 65–99)
Potassium: 3.4 mmol/L — ABNORMAL LOW (ref 3.5–5.1)
SODIUM: 142 mmol/L (ref 135–145)

## 2015-04-05 LAB — PHOSPHORUS: PHOSPHORUS: 4.7 mg/dL — AB (ref 2.5–4.6)

## 2015-04-05 LAB — CBC
HEMATOCRIT: 30.7 % — AB (ref 39.0–52.0)
Hemoglobin: 9.3 g/dL — ABNORMAL LOW (ref 13.0–17.0)
MCH: 32.9 pg (ref 26.0–34.0)
MCHC: 30.3 g/dL (ref 30.0–36.0)
MCV: 108.5 fL — ABNORMAL HIGH (ref 78.0–100.0)
PLATELETS: 52 10*3/uL — AB (ref 150–400)
RBC: 2.83 MIL/uL — ABNORMAL LOW (ref 4.22–5.81)
RDW: 17.4 % — AB (ref 11.5–15.5)
WBC: 4.1 10*3/uL (ref 4.0–10.5)

## 2015-04-05 LAB — CULTURE, BLOOD (ROUTINE X 2)

## 2015-04-05 LAB — MAGNESIUM: MAGNESIUM: 2 mg/dL (ref 1.7–2.4)

## 2015-04-05 MED ORDER — ZINC TRACE METAL 1 MG/ML IV SOLN
INTRAVENOUS | Status: AC
  Administered 2015-04-05: 18:00:00 via INTRAVENOUS
  Filled 2015-04-05: qty 960

## 2015-04-05 MED ORDER — DEXTROSE 5 % IV SOLN
INTRAVENOUS | Status: DC
  Administered 2015-04-05 – 2015-04-06 (×2): via INTRAVENOUS

## 2015-04-05 MED ORDER — VITAL HIGH PROTEIN PO LIQD: 1000.0000 mL | ORAL | Status: DC

## 2015-04-05 MED ORDER — ALBUMIN HUMAN 25 % IV SOLN
12.5000 g | Freq: Four times a day (QID) | INTRAVENOUS | Status: DC
  Administered 2015-04-05 – 2015-04-09 (×17): 12.5 g via INTRAVENOUS
  Filled 2015-04-05: qty 100
  Filled 2015-04-05 (×13): qty 50
  Filled 2015-04-05: qty 100
  Filled 2015-04-05: qty 50

## 2015-04-05 MED ORDER — POTASSIUM CHLORIDE 20 MEQ/15ML (10%) PO SOLN
20.0000 meq | Freq: Once | ORAL | Status: AC
Start: 2015-04-05 — End: 2015-04-05
  Administered 2015-04-05: 20 meq via ORAL
  Filled 2015-04-05: qty 15

## 2015-04-05 MED ORDER — POTASSIUM CHLORIDE 20 MEQ/15ML (10%) PO SOLN
20.0000 meq | Freq: Once | ORAL | Status: AC
  Administered 2015-04-05: 20 meq via ORAL
  Filled 2015-04-05: qty 15
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
## 2015-04-05 NOTE — Progress Notes (Signed)
Pt currently sedated and on ventilator after developing respiratory failure over the weekend.  Palliative care team following and have been speaking with wife; pt has been made a DNR.  Plan to start TNA for nutritional needs, continue vent weaning with the understanding that pt will not be reintubated if he fails extubation.   Will follow progress.    Quintella Baton, RN, BSN  Trauma/Neuro ICU Case Manager [phone]
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
## 2015-04-05 NOTE — Progress Notes (Addendum)
Pharmacy: Vancomycin  49yom continues on day #4 vancomycin (and zosyn) for presumed aspiration pneumonia. He went into acute renal failure on 2/26, sCr 0.49 > 2.61 > 4.1 > 4.96. Renal consulted - no plans for renal replacement therapy at this point.  Last dose vancomycin  on 2/26 @ 0822  2/26 @ 1455 Vancomycin Trough = 50, doses held  2/28 Vancomycin Random = 48  Plan: 1) Continue to hold vancomycin 2) Consider checking another level on 3/2 (vancomycin half-life ~43 hours)  Louie Casa, PharmD, BCPS 04/05/2015, 9:43 AM
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
## 2015-04-05 NOTE — Progress Notes (Signed)
Patient ID: Ricky Molina., male   DOB: 1966/01/04, 50 y.o.   MRN: [id number] I called his wife and we discussed his care. Plan will be to wean vent and continue support. If he can improve to be extubated, his sister who is an Char[employer] fundraiser wants to be here then. The plan at that point would be to not reintubate. Violeta Gelinas, MD, MPH, FACS Trauma: [phone] General Surgery: [phone]

## 2015-04-06 ENCOUNTER — Inpatient Hospital Stay (HOSPITAL_COMMUNITY): Payer: BLUE CROSS/BLUE SHIELD

## 2015-04-06 LAB — CBC
HCT: 32.3 % — ABNORMAL LOW (ref 39.0–52.0)
HEMOGLOBIN: 9.4 g/dL — AB (ref 13.0–17.0)
MCH: 32.4 pg (ref 26.0–34.0)
MCHC: 29.1 g/dL — AB (ref 30.0–36.0)
MCV: 111.4 fL — ABNORMAL HIGH (ref 78.0–100.0)
Platelets: 41 10*3/uL — ABNORMAL LOW (ref 150–400)
RBC: 2.9 MIL/uL — AB (ref 4.22–5.81)
RDW: 17.1 % — ABNORMAL HIGH (ref 11.5–15.5)
WBC: 3.4 10*3/uL — AB (ref 4.0–10.5)

## 2015-04-06 LAB — GLUCOSE, CAPILLARY
GLUCOSE-CAPILLARY: 124 mg/dL — AB (ref 65–99)
GLUCOSE-CAPILLARY: 129 mg/dL — AB (ref 65–99)
Glucose-Capillary: 129 mg/dL — ABNORMAL HIGH (ref 65–99)
Glucose-Capillary: 135 mg/dL — ABNORMAL HIGH (ref 65–99)
Glucose-Capillary: 136 mg/dL — ABNORMAL HIGH (ref 65–99)
Glucose-Capillary: 138 mg/dL — ABNORMAL HIGH (ref 65–99)

## 2015-04-06 LAB — COMPREHENSIVE METABOLIC PANEL
ALT: 73 U/L — ABNORMAL HIGH (ref 17–63)
AST: 153 U/L — AB (ref 15–41)
Albumin: 1.8 g/dL — ABNORMAL LOW (ref 3.5–5.0)
Alkaline Phosphatase: 64 U/L (ref 38–126)
Anion gap: 12 (ref 5–15)
BILIRUBIN TOTAL: 4.8 mg/dL — AB (ref 0.3–1.2)
BUN: 71 mg/dL — AB (ref 6–20)
CO2: 20 mmol/L — ABNORMAL LOW (ref 22–32)
CREATININE: 5.82 mg/dL — AB (ref 0.61–1.24)
Calcium: 7.7 mg/dL — ABNORMAL LOW (ref 8.9–10.3)
Chloride: 108 mmol/L (ref 101–111)
GFR, EST AFRICAN AMERICAN: 12 mL/min — AB (ref >60)
GFR, EST NON AFRICAN AMERICAN: 10 mL/min — AB (ref >60)
Glucose, Bld: 136 mg/dL — ABNORMAL HIGH (ref 65–99)
POTASSIUM: 3.5 mmol/L (ref 3.5–5.1)
Sodium: 140 mmol/L (ref 135–145)
TOTAL PROTEIN: 6.3 g/dL — AB (ref 6.5–8.1)

## 2015-04-06 LAB — DIFFERENTIAL
BASOS PCT: 0 %
Basophils Absolute: 0 10*3/uL (ref 0.0–0.1)
EOS ABS: 0.2 10*3/uL (ref 0.0–0.7)
EOS PCT: 7 %
LYMPHS PCT: 20 %
Lymphs Abs: 0.7 10*3/uL (ref 0.7–4.0)
MONOS PCT: 8 %
Monocytes Absolute: 0.3 10*3/uL (ref 0.1–1.0)
NEUTROS ABS: 2.2 10*3/uL (ref 1.7–7.7)
Neutrophils Relative %: 65 %

## 2015-04-06 LAB — PHOSPHORUS: Phosphorus: 5.4 mg/dL — ABNORMAL HIGH (ref 2.5–4.6)

## 2015-04-06 LAB — AMMONIA: AMMONIA: 68 umol/L — AB (ref 9–35)

## 2015-04-06 LAB — MAGNESIUM: MAGNESIUM: 2 mg/dL (ref 1.7–2.4)

## 2015-04-06 LAB — PREALBUMIN: PREALBUMIN: 2.9 mg/dL — AB (ref 18–38)

## 2015-04-06 LAB — TRIGLYCERIDES: TRIGLYCERIDES: 117 mg/dL (ref <150)

## 2015-04-06 MED ORDER — GERHARDT'S BUTT CREAM
TOPICAL_CREAM | CUTANEOUS | Status: DC | PRN
  Administered 2015-04-06: 1 via TOPICAL
  Filled 2015-04-06: qty 1

## 2015-04-06 MED ORDER — LIDOCAINE HCL (PF) 1 % IJ SOLN
INTRAMUSCULAR | Status: AC
  Administered 2015-04-06: 5 mL
  Filled 2015-04-06: qty 5

## 2015-04-06 MED ORDER — NOREPINEPHRINE BITARTRATE 1 MG/ML IV SOLN
0.0000 ug/min | INTRAVENOUS | Status: DC
  Administered 2015-04-06: 1 ug/min via INTRAVENOUS
  Filled 2015-04-06: qty 4

## 2015-04-06 MED ORDER — DEXTROSE-NACL 5-0.2 % IV SOLN
INTRAVENOUS | Status: AC
  Administered 2015-04-06: 11:00:00 via INTRAVENOUS

## 2015-04-06 MED ORDER — VITAL HIGH PROTEIN PO LIQD
1000.0000 mL | ORAL | Status: DC
  Administered 2015-04-07 – 2015-04-08 (×2): 1000 mL

## 2015-04-06 MED ORDER — M.V.I. ADULT IV INJ
INJECTION | INTRAVENOUS | Status: AC
  Administered 2015-04-06: 17:00:00 via INTRAVENOUS
  Filled 2015-04-06: qty 1440

## 2015-04-06 MED ORDER — PRO-STAT SUGAR FREE PO LIQD
30.0000 mL | Freq: Two times a day (BID) | ORAL | Status: DC
  Administered 2015-04-06 (×2): 30 mL
  Filled 2015-04-06 (×2): qty 30

## 2015-04-06 MED ORDER — DEXTROSE-NACL 5-0.2 % IV SOLN
INTRAVENOUS | Status: DC
  Administered 2015-04-06: 22:00:00 via INTRAVENOUS

## 2015-04-06 MED ORDER — PIPERACILLIN-TAZOBACTAM IN DEX 2-0.25 GM/50ML IV SOLN
2.2500 g | Freq: Three times a day (TID) | INTRAVENOUS | Status: DC
  Administered 2015-04-06 – 2015-04-09 (×9): 2.25 g via INTRAVENOUS
  Filled 2015-04-06 (×12): qty 50

## 2015-04-06 NOTE — Progress Notes (Signed)
I called to speak with patient's wife this afternoon. Left message on voicemail.  She returned call shortly afterward and reports that it seems as though her husband is improving based upon reports she has been getting from her son (who comes to visit him on a daily basis).  She reports she "needs to give him every chance" and therefore called and asked to change his CODE STATUS to full code earlier this afternoon.  This was the first interaction I had with [name] family, and so time was spent trying to build rapport and understand the difficult situation they have been dealing with for the last month.  She is agreeable to me calling to follow-up tomorrow and discuss further at that time.  We'll plan for follow-up call tomorrow.  Ricky Minus, MD [facility] Palliative Medicine Team [phone]

## 2015-04-06 NOTE — Progress Notes (Signed)
[facility] ROUNDING NOTE   Subjective:   Interval History:  Patient still oliguric    Objective:  Vital signs in last 24 hours:  Temp:  [97 F (36.1 C)-99 F (37.2 C)] 97.9 F (36.6 C) (03/01 0749) Pulse Rate:  [93-114] 104 (03/01 0825) Resp:  [15-25] 22 (03/01 0825) BP: (85-134)/(43-85) 134/52 mmHg (03/01 0825) SpO2:  [92 %-99 %] 98 % (03/01 0825) Arterial Line BP: (97-154)/(43-91) 132/52 mmHg (03/01 0800) FiO2 (%):  [40 %] 40 % (03/01 0825) Weight:  [126.1 kg (278 lb)] 126.1 kg (278 lb) (03/01 0300)  Weight change: 0.7 kg (1 lb 8.7 oz) Filed Weights   04/04/15 0500 04/05/15 0433 04/06/15 0300  Weight: 125.2 kg (276 lb 0.3 oz) 125.4 kg (276 lb 7.3 oz) 126.1 kg (278 lb)    Intake/Output: I/O last 3 completed shifts: In: 5280.2 [I.V.:3153.4; NG/GT:1185; IV Piggyback:412.5] Out: 3025 [Urine:925; Emesis/NG output:900; Stool:1200]   Intake/Output this shift:  Total I/O In: 315 [I.V.:120; NG/GT:40; IV Piggyback:75; TPN:80] Out: 300 [Urine:100; Emesis/NG output:100; Stool:100]   icteric CVS- RRR RS- CTA diminished at base ABD-  Tense with ascites  EXT-  3 + edema   Basic Metabolic Panel:  Recent Labs Lab 04/02/15 0409 04/03/15 0337 04/04/15 0400 04/05/15 0400 04/06/15 0550  NA 150* 150* 143 142 140  K 3.8 3.3* 3.2* 3.4* 3.5  CL 112* 114* 112* 109 108  CO2 29 25 22  21* 20*  GLUCOSE 174* 155* 137* 137* 136*  BUN 13 31* 49* 60* 71*  CREATININE 0.49* 2.61* 4.10* 4.96* 5.82*  CALCIUM 7.7* 7.3* 7.0* 7.3* 7.7*  MG 1.9  --   --  2.0 2.0  PHOS 2.1*  --   --  4.7* 5.4*    Liver Function Tests:  Recent Labs Lab 04/02/15 0409 04/03/15 0337 04/04/15 0400 04/06/15 0550  AST 204* 225* 145* 153*  ALT 106* 111* 87* 73*  ALKPHOS 88 74 71 64  BILITOT 4.4* 5.8* 5.1* 4.8*  PROT 6.8 6.6 6.5 6.3*  ALBUMIN 1.7* 1.5* 1.4* 1.8*   No results for input(s): LIPASE, AMYLASE in the last 168 hours.  Recent Labs Lab 04/02/15 0020 04/06/15 0430  AMMONIA  80* 68*    CBC:  Recent Labs Lab 04/02/15 0409 04/03/15 0337 04/04/15 0400 04/05/15 0400 04/06/15 0415  WBC 6.6 10.4 8.8 4.1 3.4*  NEUTROABS 4.2  --   --   --  2.2  HGB 10.2* 10.1* 9.8* 9.3* 9.4*  HCT 35.4* 33.9* 32.7* 30.7* 32.3*  MCV 113.8* 111.9* 109.7* 108.5* 111.4*  PLT 73* 71* 78* 52* 41*    Cardiac Enzymes: No results for input(s): CKTOTAL, CKMB, CKMBINDEX, TROPONINI in the last 168 hours.  BNP: Invalid input(s): POCBNP  CBG:  Recent Labs Lab 04/05/15 1522 04/05/15 1937 04/06/15 0009 04/06/15 0347 04/06/15 0736  GLUCAP 128* 88 136* 124* 129*    Microbiology: Results for orders placed or performed during the hospital encounter of 03/07/2015  MRSA PCR Screening     Status: None   Collection Time: 03/06/15  5:23 AM  Result Value Ref Range Status   MRSA by PCR NEGATIVE NEGATIVE Final    Comment:        The GeneXpert MRSA Assay (FDA approved for NASAL specimens only), is one component of a comprehensive MRSA colonization surveillance program. It is not intended to diagnose MRSA infection nor to guide or monitor treatment for MRSA infections.   Culture, respiratory (NON-Expectorated)     Status: None   Collection Time:  03/09/15 11:22 AM  Result Value Ref Range Status   Specimen Description TRACHEAL ASPIRATE  Final   Special Requests Normal  Final   Gram Stain   Final    ABUNDANT WBC PRESENT, PREDOMINANTLY PMN FEW SQUAMOUS EPITHELIAL CELLS PRESENT FEW GRAM POSITIVE COCCI IN PAIRS Performed at [facility]    Culture   Final    ABUNDANT GROUP B STREP(S.AGALACTIAE)ISOLATED Performed at [facility]    Report Status 03/11/2015 FINAL  Final  Culture, blood (Routine X 2) w Reflex to ID Panel     Status: None   Collection Time: 03/15/15 10:32 PM  Result Value Ref Range Status   Specimen Description BLOOD LEFT ANTECUBITAL  Final   Special Requests BOTTLES DRAWN AEROBIC AND ANAEROBIC 10CC  Final   Culture  Setup Time   Final    GRAM  POSITIVE COCCI IN CLUSTERS ANAEROBIC BOTTLE ONLY CRITICAL RESULT CALLED TO, READ BACK BY AND VERIFIED WITH: M [facility] AT 1556 03/16/15 BY L BENFIELD    Culture   Final    STAPHYLOCOCCUS SPECIES (COAGULASE NEGATIVE) THE SIGNIFICANCE OF ISOLATING THIS ORGANISM FROM A SINGLE SET OF BLOOD CULTURES WHEN MULTIPLE SETS ARE DRAWN IS UNCERTAIN. PLEASE NOTIFY THE MICROBIOLOGY DEPARTMENT WITHIN ONE WEEK IF SPECIATION AND SENSITIVITIES ARE REQUIRED.    Report Status 03/19/2015 FINAL  Final  Culture, blood (Routine X 2) w Reflex to ID Panel     Status: None   Collection Time: 03/15/15 10:41 PM  Result Value Ref Range Status   Specimen Description BLOOD LEFT HAND  Final   Special Requests BOTTLES DRAWN AEROBIC ONLY 10CC  Final   Culture NO GROWTH 5 DAYS  Final   Report Status 03/20/2015 FINAL  Final  Culture, respiratory (NON-Expectorated)     Status: None   Collection Time: 03/16/15 12:02 AM  Result Value Ref Range Status   Specimen Description TRACHEAL ASPIRATE  Final   Special Requests NONE  Final   Gram Stain   Final    NO WBC SEEN NO SQUAMOUS EPITHELIAL CELLS SEEN NO ORGANISMS SEEN Performed at [facility]    Culture   Final    NORMAL OROPHARYNGEAL FLORA Performed at [facility]    Report Status 03/18/2015 FINAL  Final  Culture, blood (Routine X 2) w Reflex to ID Panel     Status: None   Collection Time: 03/20/15  1:30 PM  Result Value Ref Range Status   Specimen Description BLOOD LEFT ANTECUBITAL  Final   Special Requests BOTTLES DRAWN AEROBIC AND ANAEROBIC 10CC  Final   Culture NO GROWTH 5 DAYS  Final   Report Status 03/25/2015 FINAL  Final  Culture, blood (Routine X 2) w Reflex to ID Panel     Status: None   Collection Time: 03/20/15  1:40 PM  Result Value Ref Range Status   Specimen Description BLOOD LEFT HAND  Final   Special Requests BOTTLES DRAWN AEROBIC AND ANAEROBIC 10CC  Final   Culture NO GROWTH 5 DAYS  Final   Report Status 03/25/2015 FINAL  Final  C  difficile quick scan w PCR reflex     Status: None   Collection Time: 03/25/15  2:01 PM  Result Value Ref Range Status   C Diff antigen NEGATIVE NEGATIVE Final   C Diff toxin NEGATIVE NEGATIVE Final   C Diff interpretation Negative for toxigenic C. difficile  Final  Culture, blood (routine x 2)     Status: None   Collection Time: 04/02/15  12:04 AM  Result Value Ref Range Status   Specimen Description BLOOD LEFT ANTECUBITAL  Final   Special Requests BOTTLES DRAWN AEROBIC AND ANAEROBIC 5CC  Final   Culture  Setup Time   Final    GRAM POSITIVE RODS AEROBIC BOTTLE ONLY CRITICAL RESULT CALLED TO, READ BACK BY AND VERIFIED WITH: K ADKINS,RN AT 1431 04/03/15 BY L BENFIELD    Culture   Final    DIPHTHEROIDS(CORYNEBACTERIUM SPECIES) Standardized susceptibility testing for this organism is not available.    Report Status 04/05/2015 FINAL  Final  Culture, blood (routine x 2)     Status: None (Preliminary result)   Collection Time: 04/02/15 12:16 AM  Result Value Ref Range Status   Specimen Description BLOOD LEFT ANTECUBITAL  Final   Special Requests BOTTLES DRAWN AEROBIC AND ANAEROBIC 5CC  Final   Culture NO GROWTH 3 DAYS  Final   Report Status PENDING  Incomplete  Culture, body fluid-bottle     Status: None (Preliminary result)   Collection Time: 04/02/15  1:54 AM  Result Value Ref Range Status   Specimen Description FLUID  Final   Special Requests PLEURAL AEROBIC BOTTLE ONLY  Final   Culture NO GROWTH 3 DAYS  Final   Report Status PENDING  Incomplete  Gram stain     Status: None   Collection Time: 04/02/15  1:54 AM  Result Value Ref Range Status   Specimen Description FLUID  Final   Special Requests PLEURAL  Final   Gram Stain   Final    CYTOSPIN SMEAR WBC PRESENT,BOTH PMN AND MONONUCLEAR NO ORGANISMS SEEN    Report Status 04/02/2015 FINAL  Final  Culture, respiratory (NON-Expectorated)     Status: None   Collection Time: 04/02/15  7:29 AM  Result Value Ref Range Status    Specimen Description TRACHEAL ASPIRATE  Final   Special Requests NONE  Final   Gram Stain   Final    FEW WBC PRESENT,BOTH PMN AND MONONUCLEAR RARE SQUAMOUS EPITHELIAL CELLS PRESENT FEW GRAM POSITIVE RODS Performed at [facility]    Culture   Final    NORMAL OROPHARYNGEAL FLORA Performed at [facility]    Report Status 04/04/2015 FINAL  Final    Coagulation Studies: No results for input(s): LABPROT, INR in the last 72 hours.  Urinalysis: No results for input(s): COLORURINE, LABSPEC, PHURINE, GLUCOSEU, HGBUR, BILIRUBINUR, KETONESUR, PROTEINUR, UROBILINOGEN, NITRITE, LEUKOCYTESUR in the last 72 hours.  Invalid input(s): APPERANCEUR    Imaging: Dg Chest Port 1 View  04/05/2015  CLINICAL DATA:  Respiratory failure. EXAM: PORTABLE CHEST 1 VIEW COMPARISON:  04/04/2015. FINDINGS: Endotracheal tube, right PICC line, NG tube in stable position. Cardiomegaly with pulmonary vascular prominence and interstitial prominence with bilateral small pleural effusions. No pneumothorax. Old healed right clavicular fracture. IMPRESSION: 1.  Lines and tubes in stable position. 2. Cardiomegaly with mild bilateral pulmonary interstitial prominence suggesting mild congestive heart failure. Electronically Signed   By: Maisie Fus  Register   On: 04/05/2015 07:30     Medications:   . Marland KitchenTPN (CLINIMIX-E) Adult 40 mL/hr at 04/05/15 1746  . dextrose 60 mL/hr at 04/06/15 0600  . feeding supplement (VITAL HIGH PROTEIN) 1,000 mL (04/05/15 0730)  . phenylephrine (NEO-SYNEPHRINE) Adult infusion Stopped (04/04/15 2000)   . albumin human  12.5 g Intravenous Q6H  . antiseptic oral rinse  7 mL Mouth Rinse QID  . chlorhexidine gluconate  15 mL Mouth Rinse BID  . escitalopram  20 mg Per Tube Daily  .  folic acid  1 mg Per Tube Daily  . insulin aspart  3-18 Units Subcutaneous 6 times per day  . insulin glargine  15 Units Subcutaneous BID  . lactulose  30 g Per Tube 3 times per day  . levETIRAcetam  500 mg  Per Tube BID  . multivitamin with minerals  1 tablet Oral Daily  . pantoprazole sodium  40 mg Per Tube Daily  . piperacillin-tazobactam (ZOSYN)  IV  3.375 g Intravenous 3 times per day  . thiamine  100 mg Per Tube Daily   Place/Maintain arterial line **AND** sodium chloride, fentaNYL (SUBLIMAZE) injection, ipratropium-albuterol, metoprolol, midazolam, [DISCONTINUED] ondansetron **OR** ondansetron (ZOFRAN) IV, sodium chloride flush  Assessment/ Plan:    Acute kidney Injury with hypotension and liver disease following paracentesis -- this may indicate hepatorenal syndrome versus ATN -- the plan would be supportive care for now. Extubation planned -- dialysis would need to be considered if supported by family and trauma MD  Electrolytes are all stable  Hypokalemia is mild  Albumin and pressors have been used for hepatorenal syndrome with some mixed success. Norepinephrine could be started at low dose   LOS: 32 [address] W  :03 AM

## 2015-04-06 NOTE — Progress Notes (Signed)
Follow up - Trauma and Critical Care  Patient Details:    Ricky Molina. is an 50 y.o. male.  Lines/tubes : Airway 8 mm (Active)  Secured at (cm) 26 cm 04/06/2015  4:00 AM  Measured From Lips 04/06/2015  4:00 AM  Secured Location Center 04/06/2015  4:00 AM  Secured By [employer] 04/06/2015  4:00 AM  Tube Holder Repositioned Yes 04/06/2015  4:00 AM  Cuff Pressure (cm H2O) 26 cm H2O 04/05/2015 11:35 PM  Site Condition Dry 04/06/2015  4:00 AM     PICC Triple Lumen 03/07/15 PICC Right Basilic 48 cm 0 cm (Active)  Indication for Insertion or Continuance of Line Administration of hyperosmolar/irritating solutions (i.e. TPN, Vancomycin, etc.) 04/05/2015  8:00 PM  Exposed Catheter (cm) 0 cm 04/05/2015  7:00 AM  Site Assessment Clean;Dry;Intact 04/05/2015  8:00 PM  Lumen #1 Status Flushed 04/05/2015  8:00 PM  Lumen #2 Status Infusing 04/05/2015  8:00 PM  Lumen #3 Status Infusing 04/05/2015  8:00 PM  Dressing Type Transparent;Occlusive 04/05/2015  8:00 PM  Dressing Status Clean;Dry;Intact;Antimicrobial disc in place 04/05/2015  8:00 PM  Line Care Connections checked and tightened;Zeroed and calibrated;Leveled 04/05/2015  8:00 PM  Dressing Intervention New dressing;Dressing changed;Antimicrobial disc changed 04/04/2015 11:00 AM  Dressing Change Due 04/24/2015 04/04/2015 11:00 AM     Arterial Line 04/02/15 Left Radial (Active)  Site Assessment Clean;Dry;Intact 04/05/2015  8:00 PM  Line Status Pulsatile blood flow 04/05/2015  8:00 PM  Art Line Waveform Appropriate 04/05/2015  8:00 PM  Art Line Interventions Zeroed and calibrated;Leveled;Connections checked and tightened 04/05/2015  8:00 PM  Color/Movement/Sensation Capillary refill less than 3 sec;Cool fingers/toes 04/05/2015  8:00 PM  Dressing Type Transparent;Occlusive 04/05/2015  8:00 PM  Dressing Status Clean;Dry;Intact;Antimicrobial disc in place 04/05/2015  8:00 PM  Interventions New dressing;Dressing changed;Antimicrobial disc changed 04/04/2015  2:40  PM  Dressing Change Due 04/25/2015 04/04/2015  2:40 PM     NG/OG Tube Orogastric 14 Fr. Center mouth (Active)  Placement Verification Auscultation 04/06/2015  4:00 AM  Site Assessment Clean;Dry;Intact 04/06/2015  4:00 AM  Status Suction-low intermittent;Irrigated;Retaped 04/06/2015  4:00 AM  Amount of suction 125 mmHg 04/05/2015  7:30 AM  Drainage Appearance Yellow 04/06/2015  4:00 AM  Intake (mL) 30 mL 04/06/2015  4:00 AM  Output (mL) 100 mL 04/06/2015 12:00 AM     Rectal Tube/Pouch (Active)  Output (mL) 0 mL 04/05/2015  8:00 PM  Intake (mL) 0 mL 04/05/2015  7:30 AM     Urethral Catheter Ricky Molina, NT Non-latex 14 Fr. (Active)  Indication for Insertion or Continuance of Catheter Unstable critical patients (first 24-48 hours) 04/06/2015  4:00 AM  Site Assessment Clean;Intact;Dry 04/06/2015  4:00 AM  Catheter Maintenance Bag below level of bladder;Catheter secured;Drainage bag/tubing not touching floor;Insertion date on drainage bag;No dependent loops;Seal intact 04/06/2015  4:00 AM  Collection Container Standard drainage bag 04/06/2015  4:00 AM  Securement Method Leg strap 04/06/2015  4:00 AM  Urinary Catheter Interventions Unclamped 04/06/2015  4:00 AM  Input (mL) 0 mL 04/05/2015  7:30 AM  Output (mL) 100 mL 04/06/2015  6:00 AM    Microbiology/Sepsis markers: Results for orders placed or performed during the hospital encounter of 03-15-15  MRSA PCR Screening     Status: None   Collection Time: 03/06/15  5:23 AM  Result Value Ref Range Status   MRSA by PCR NEGATIVE NEGATIVE Final    Comment:        The GeneXpert MRSA Assay (FDA approved for  NASAL specimens only), is one component of a comprehensive MRSA colonization surveillance program. It is not intended to diagnose MRSA infection nor to guide or monitor treatment for MRSA infections.   Culture, respiratory (NON-Expectorated)     Status: None   Collection Time: 03/09/15 11:22 AM  Result Value Ref Range Status   Specimen Description TRACHEAL ASPIRATE   Final   Special Requests Normal  Final   Gram Stain   Final    ABUNDANT WBC PRESENT, PREDOMINANTLY PMN FEW SQUAMOUS EPITHELIAL CELLS PRESENT FEW GRAM POSITIVE COCCI IN PAIRS Performed at [facility]    Culture   Final    ABUNDANT GROUP B STREP(S.AGALACTIAE)ISOLATED Performed at [facility]    Report Status 03/11/2015 FINAL  Final  Culture, blood (Routine X 2) w Reflex to ID Panel     Status: None   Collection Time: 03/15/15 10:32 PM  Result Value Ref Range Status   Specimen Description BLOOD LEFT ANTECUBITAL  Final   Special Requests BOTTLES DRAWN AEROBIC AND ANAEROBIC 10CC  Final   Culture  Setup Time   Final    GRAM POSITIVE COCCI IN CLUSTERS ANAEROBIC BOTTLE ONLY CRITICAL RESULT CALLED TO, READ BACK BY AND VERIFIED WITH: M [facility] AT 1556 03/16/15 BY L BENFIELD    Culture   Final    STAPHYLOCOCCUS SPECIES (COAGULASE NEGATIVE) THE SIGNIFICANCE OF ISOLATING THIS ORGANISM FROM A SINGLE SET OF BLOOD CULTURES WHEN MULTIPLE SETS ARE DRAWN IS UNCERTAIN. PLEASE NOTIFY THE MICROBIOLOGY DEPARTMENT WITHIN ONE WEEK IF SPECIATION AND SENSITIVITIES ARE REQUIRED.    Report Status 03/19/2015 FINAL  Final  Culture, blood (Routine X 2) w Reflex to ID Panel     Status: None   Collection Time: 03/15/15 10:41 PM  Result Value Ref Range Status   Specimen Description BLOOD LEFT HAND  Final   Special Requests BOTTLES DRAWN AEROBIC ONLY 10CC  Final   Culture NO GROWTH 5 DAYS  Final   Report Status 03/20/2015 FINAL  Final  Culture, respiratory (NON-Expectorated)     Status: None   Collection Time: 03/16/15 12:02 AM  Result Value Ref Range Status   Specimen Description TRACHEAL ASPIRATE  Final   Special Requests NONE  Final   Gram Stain   Final    NO WBC SEEN NO SQUAMOUS EPITHELIAL CELLS SEEN NO ORGANISMS SEEN Performed at [facility]    Culture   Final    NORMAL OROPHARYNGEAL FLORA Performed at [facility]    Report Status 03/18/2015 FINAL  Final   Culture, blood (Routine X 2) w Reflex to ID Panel     Status: None   Collection Time: 03/20/15  1:30 PM  Result Value Ref Range Status   Specimen Description BLOOD LEFT ANTECUBITAL  Final   Special Requests BOTTLES DRAWN AEROBIC AND ANAEROBIC 10CC  Final   Culture NO GROWTH 5 DAYS  Final   Report Status 03/25/2015 FINAL  Final  Culture, blood (Routine X 2) w Reflex to ID Panel     Status: None   Collection Time: 03/20/15  1:40 PM  Result Value Ref Range Status   Specimen Description BLOOD LEFT HAND  Final   Special Requests BOTTLES DRAWN AEROBIC AND ANAEROBIC 10CC  Final   Culture NO GROWTH 5 DAYS  Final   Report Status 03/25/2015 FINAL  Final  C difficile quick scan w PCR reflex     Status: None   Collection Time: 03/25/15  2:01 PM  Result Value Ref Range Status  C Diff antigen NEGATIVE NEGATIVE Final   C Diff toxin NEGATIVE NEGATIVE Final   C Diff interpretation Negative for toxigenic C. difficile  Final  Culture, blood (routine x 2)     Status: None   Collection Time: 04/02/15 12:04 AM  Result Value Ref Range Status   Specimen Description BLOOD LEFT ANTECUBITAL  Final   Special Requests BOTTLES DRAWN AEROBIC AND ANAEROBIC 5CC  Final   Culture  Setup Time   Final    GRAM POSITIVE RODS AEROBIC BOTTLE ONLY CRITICAL RESULT CALLED TO, READ BACK BY AND VERIFIED WITH: K ADKINS,RN AT 1431 04/03/15 BY L BENFIELD    Culture   Final    DIPHTHEROIDS(CORYNEBACTERIUM SPECIES) Standardized susceptibility testing for this organism is not available.    Report Status 04/05/2015 FINAL  Final  Culture, blood (routine x 2)     Status: None (Preliminary result)   Collection Time: 04/02/15 12:16 AM  Result Value Ref Range Status   Specimen Description BLOOD LEFT ANTECUBITAL  Final   Special Requests BOTTLES DRAWN AEROBIC AND ANAEROBIC 5CC  Final   Culture NO GROWTH 3 DAYS  Final   Report Status PENDING  Incomplete  Culture, body fluid-bottle     Status: None (Preliminary result)   Collection  Time: 04/02/15  1:54 AM  Result Value Ref Range Status   Specimen Description FLUID  Final   Special Requests PLEURAL AEROBIC BOTTLE ONLY  Final   Culture NO GROWTH 3 DAYS  Final   Report Status PENDING  Incomplete  Gram stain     Status: None   Collection Time: 04/02/15  1:54 AM  Result Value Ref Range Status   Specimen Description FLUID  Final   Special Requests PLEURAL  Final   Gram Stain   Final    CYTOSPIN SMEAR WBC PRESENT,BOTH PMN AND MONONUCLEAR NO ORGANISMS SEEN    Report Status 04/02/2015 FINAL  Final  Culture, respiratory (NON-Expectorated)     Status: None   Collection Time: 04/02/15  7:29 AM  Result Value Ref Range Status   Specimen Description TRACHEAL ASPIRATE  Final   Special Requests NONE  Final   Gram Stain   Final    FEW WBC PRESENT,BOTH PMN AND MONONUCLEAR RARE SQUAMOUS EPITHELIAL CELLS PRESENT FEW GRAM POSITIVE RODS Performed at [facility]    Culture   Final    NORMAL OROPHARYNGEAL FLORA Performed at [facility]    Report Status 04/04/2015 FINAL  Final    Anti-infectives:  Anti-infectives    Start     Dose/Rate Route Frequency Ordered Stop   04/02/15 0800  piperacillin-tazobactam (ZOSYN) IVPB 3.375 g     3.375 g 12.5 mL/hr over 240 Minutes Intravenous 3 times per day 04/01/15 2356     04/02/15 0800  vancomycin (VANCOCIN) 1,250 mg in sodium chloride 0.9 % 250 mL IVPB  Status:  Discontinued     1,250 mg 166.7 mL/hr over 90 Minutes Intravenous Every 8 hours 04/01/15 2356 04/03/15 1337   04/02/15 0000  piperacillin-tazobactam (ZOSYN) IVPB 3.375 g     3.375 g 100 mL/hr over 30 Minutes Intravenous STAT 04/01/15 2356 04/02/15 0137   04/02/15 0000  vancomycin (VANCOCIN) 2,000 mg in sodium chloride 0.9 % 500 mL IVPB     2,000 mg 250 mL/hr over 120 Minutes Intravenous STAT 04/01/15 2356 04/02/15 0230   03/18/15 0800  vancomycin (VANCOCIN) 1,250 mg in sodium chloride 0.9 % 250 mL IVPB  Status:  Discontinued     1,250  mg 166.7 mL/hr over  90 Minutes Intravenous Every 8 hours 03/18/15 0750 03/19/15 1517   03/17/15 0930  cefTRIAXone (ROCEPHIN) 2 g in dextrose 5 % 50 mL IVPB  Status:  Discontinued     2 g 100 mL/hr over 30 Minutes Intravenous Every 24 hours 03/17/15 0915 03/22/15 1033   03/16/15 2000  vancomycin (VANCOCIN) 1,500 mg in sodium chloride 0.9 % 500 mL IVPB  Status:  Discontinued     1,500 mg 250 mL/hr over 120 Minutes Intravenous Every 24 hours 03/16/15 1929 03/18/15 0750   03/14/15 0900  cefTRIAXone (ROCEPHIN) 1 g in dextrose 5 % 50 mL IVPB     1 g 100 mL/hr over 30 Minutes Intravenous Every 24 hours 03/14/15 0835 03/16/15 0825   03/12/15 0900  cefTRIAXone (ROCEPHIN) injection 1 g  Status:  Discontinued     1 g Intramuscular Every 24 hours 03/12/15 0851 03/14/15 0835   03/09/15 1030  piperacillin-tazobactam (ZOSYN) IVPB 3.375 g  Status:  Discontinued     3.375 g 12.5 mL/hr over 240 Minutes Intravenous Every 8 hours 03/09/15 1000 03/12/15 0851      Best Practice/Protocols:  VTE Prophylaxis: Lovenox (prophylaxtic dose) and Mechanical GI Prophylaxis: Proton Pump Inhibitor No sedation and no pressors.    Consults: Treatment Team:  Leata Mouse, MD Palliative Remus Blake, MD    Events:  Subjective:    Overnight Issues: Unrine output still very marginal.  No problems overnight.  Objective:  Vital signs for last 24 hours: Temp:  [97 F (36.1 C)-99 F (37.2 C)] 97.9 F (36.6 C) (03/01 0749) Pulse Rate:  [93-114] 97 (03/01 0700) Resp:  [15-33] 20 (03/01 0700) BP: (85-124)/(43-85) 98/60 mmHg (03/01 0700) SpO2:  [91 %-99 %] 96 % (03/01 0700) Arterial Line BP: (97-154)/(43-91) 118/47 mmHg (03/01 0700) FiO2 (%):  [40 %] 40 % (03/01 0400) Weight:  [126.1 kg (278 lb)] 126.1 kg (278 lb) (03/01 0300)  Hemodynamic parameters for last 24 hours: CVP:  [52 mmHg] 52 mmHg  Intake/Output from previous day: 02/28 0701 - 03/01 0700 In: 3765.8 [I.V.:1944; NG/GT:955; IV Piggyback:337.5;  TPN:529.3] Out: 1900 [Urine:700; Emesis/NG output:600; Stool:600]  Intake/Output this shift:    Vent settings for last 24 hours: Vent Mode:  [-] PRVC FiO2 (%):  [40 %] 40 % Set Rate:  [20 bmp] 20 bmp Vt Set:  [600 mL] 600 mL PEEP:  [5 cmH20] 5 cmH20 Plateau Pressure:  [24 cmH20-26 cmH20] 24 cmH20  Physical Exam:  General: alert and was coughing in some distress this AM Neuro: alert, nonfocal exam and RASS 0 Resp: diminished breath sounds bibasilar and bilaterally CVS: regular rate and rhythm, S1, S2 normal, no murmur, click, rub or gallop and sinus tachycardia GI: tender, distended, hypoactive BS and very tense. Extremities: edema 3+, unequal size and right leg appears to be bigger than the left  Results for orders placed or performed during the hospital encounter of 02/16/2015 (from the past 24 hour(s))  Glucose, capillary     Status: Abnormal   Collection Time: 04/05/15 12:12 PM  Result Value Ref Range   Glucose-Capillary 128 (H) 65 - 99 mg/dL   Comment 1 Capillary Specimen    Comment 2 Notify RN   Glucose, capillary     Status: Abnormal   Collection Time: 04/05/15  3:22 PM  Result Value Ref Range   Glucose-Capillary 128 (H) 65 - 99 mg/dL   Comment 1 Capillary Specimen    Comment 2 Notify RN   Glucose, capillary  Status: None   Collection Time: 04/05/15  7:37 PM  Result Value Ref Range   Glucose-Capillary 88 65 - 99 mg/dL   Comment 1 Capillary Specimen    Comment 2 Notify RN    Comment 3 Document in Chart   Glucose, capillary     Status: Abnormal   Collection Time: 04/06/15 12:09 AM  Result Value Ref Range   Glucose-Capillary 136 (H) 65 - 99 mg/dL   Comment 1 Capillary Specimen    Comment 2 Notify RN    Comment 3 Document in Chart   Glucose, capillary     Status: Abnormal   Collection Time: 04/06/15  3:47 AM  Result Value Ref Range   Glucose-Capillary 124 (H) 65 - 99 mg/dL   Comment 1 Capillary Specimen    Comment 2 Notify RN    Comment 3 Document in Chart    CBC     Status: Abnormal   Collection Time: 04/06/15  4:15 AM  Result Value Ref Range   WBC 3.4 (L) 4.0 - 10.5 K/uL   RBC 2.90 (L) 4.22 - 5.81 MIL/uL   Hemoglobin 9.4 (L) 13.0 - 17.0 g/dL   HCT 16.1 (L) 09.6 - 04.5 %   MCV 111.4 (H) 78.0 - 100.0 fL   MCH 32.4 26.0 - 34.0 pg   MCHC 29.1 (L) 30.0 - 36.0 g/dL   RDW 40.9 (H) 81.1 - 91.4 %   Platelets 41 (L) 150 - 400 K/uL  Prealbumin     Status: Abnormal   Collection Time: 04/06/15  4:15 AM  Result Value Ref Range   Prealbumin 2.9 (L) 18 - 38 mg/dL  Triglycerides     Status: None   Collection Time: 04/06/15  4:15 AM  Result Value Ref Range   Triglycerides 117 <150 mg/dL  Differential     Status: None   Collection Time: 04/06/15  4:15 AM  Result Value Ref Range   Neutrophils Relative % 65 %   Lymphocytes Relative 20 %   Monocytes Relative 8 %   Eosinophils Relative 7 %   Basophils Relative 0 %   Neutro Abs 2.2 1.7 - 7.7 K/uL   Lymphs Abs 0.7 0.7 - 4.0 K/uL   Monocytes Absolute 0.3 0.1 - 1.0 K/uL   Eosinophils Absolute 0.2 0.0 - 0.7 K/uL   Basophils Absolute 0.0 0.0 - 0.1 K/uL   RBC Morphology OVAL MACROCYTES   Ammonia     Status: Abnormal   Collection Time: 04/06/15  4:30 AM  Result Value Ref Range   Ammonia 68 (H) 9 - 35 umol/L  Comprehensive metabolic panel     Status: Abnormal   Collection Time: 04/06/15  5:50 AM  Result Value Ref Range   Sodium 140 135 - 145 mmol/L   Potassium 3.5 3.5 - 5.1 mmol/L   Chloride 108 101 - 111 mmol/L   CO2 20 (L) 22 - 32 mmol/L   Glucose, Bld 136 (H) 65 - 99 mg/dL   BUN 71 (H) 6 - 20 mg/dL   Creatinine, Ser 7.82 (H) 0.61 - 1.24 mg/dL   Calcium 7.7 (L) 8.9 - 10.3 mg/dL   Total Protein 6.3 (L) 6.5 - 8.1 g/dL   Albumin 1.8 (L) 3.5 - 5.0 g/dL   AST 956 (H) 15 - 41 U/L   ALT 73 (H) 17 - 63 U/L   Alkaline Phosphatase 64 38 - 126 U/L   Total Bilirubin 4.8 (H) 0.3 - 1.2 mg/dL   GFR calc non Af Denyse Dago  10 (L) >60 mL/min   GFR calc Af Amer 12 (L) >60 mL/min   Anion gap 12 5 - 15  Magnesium      Status: None   Collection Time: 04/06/15  5:50 AM  Result Value Ref Range   Magnesium 2.0 1.7 - 2.4 mg/dL  Phosphorus     Status: Abnormal   Collection Time: 04/06/15  5:50 AM  Result Value Ref Range   Phosphorus 5.4 (H) 2.5 - 4.6 mg/dL     Assessment/Plan:   NEURO  Altered Mental Status:  encephalopathy and hepatic encephalopathy   Plan: Ammonia level 68, down a bit, patient is a bit more responsive.  PULM  Atelectasis/collapse (bibasilar based on CXR from yesterday.)   Plan: CPM  CARDIO  Sinus tachycardia   Plan: CPM  RENAL  Oliguria (suspect ATN and with increasing BUN and creatinine also)   Plan: Renal is following this patient also, no plans for dialysis.  GI  Ileus and tense ascites   Plan: Needs repeat paracentesis.  Increase tube feedings to 30  ID  No specific infectious source   Plan: CPM  HEME  Anemia anemia of chronic disease, anemia of critical illness and anemia of renal disease)   Plan: No blood for now.  ENDO No specific issues   Plan: CPM  Global Issues  Patient with tense ascites.  Needs paracentesis.  Will see if can be done by IR.  Wean ventilator and at some point decide if we want to give him one last try at recovering and extubating.  Will need to talk to the family again to see if they wound like to reintubate if extubated and got into distress, or pass.    LOS: 32 days   Additional comments:I reviewed the patient's new clinical lab test results. cbc/bmet/LFTs  Critical Care Total Time*: 30 Minutes  Lourdez Mcgahan 04/06/2015  *Care during the described time interval was provided by me and/or other providers on the critical care team.  I have reviewed this patient's available data, including medical history, events of note, physical examination and test results as part of my evaluation.

## 2015-04-06 NOTE — Significant Event (Addendum)
Watery stools, occurring after cleaning and repositioning. Skin is excoriated from the stools. Ordered Gerhardt's cream and placed flexiseal successfuly. Patient did not tolerate flexiseal prior days-had no rectal tone and kept pushing it out. Not successful with rectal pouches r/t leakages. Had discussed with trauma if able to place flexiseal, MD agreeable for RN to place flexiseal.
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
## 2015-04-06 NOTE — Clinical Social Work Note (Signed)
CSW continuing to follow and assist with disposition.  Projection is SNF once medically stable.  Vickii Penna, LCSW [phone]  5N1-9; 2S 15-16 and [facility] Licensed Clinical Social Worker

## 2015-04-06 NOTE — Significant Event (Signed)
1200pm-Patient's spouse called RN and want to rescind the DNR status. Patient's spouse wants patient to be FULL CODE. RN called Dr. Lindie Spruce to let him know. Per MD, RN can place code status order in EPIC since MD is in the OR at the time.

## 2015-04-06 NOTE — Procedures (Signed)
Successful US guided paracentesis from LLQ.  Yielded 3 liters of clear yellow fluid.  No immediate complications.  Pt tolerated well.   Specimen was not sent for labs.  Adaliz Dobis S Cressida Milford PA-C 04/06/2015 3:52 PM

## 2015-04-07 ENCOUNTER — Inpatient Hospital Stay (HOSPITAL_COMMUNITY): Payer: BLUE CROSS/BLUE SHIELD

## 2015-04-07 DIAGNOSIS — Z515 Encounter for palliative care: Secondary | ICD-10-CM

## 2015-04-07 DIAGNOSIS — Z7189 Other specified counseling: Secondary | ICD-10-CM

## 2015-04-07 LAB — COMPREHENSIVE METABOLIC PANEL
ALK PHOS: 67 U/L (ref 38–126)
ALT: 76 U/L — ABNORMAL HIGH (ref 17–63)
ANION GAP: 14 (ref 5–15)
AST: 169 U/L — ABNORMAL HIGH (ref 15–41)
Albumin: 2.1 g/dL — ABNORMAL LOW (ref 3.5–5.0)
BILIRUBIN TOTAL: 4.6 mg/dL — AB (ref 0.3–1.2)
BUN: 76 mg/dL — ABNORMAL HIGH (ref 6–20)
CALCIUM: 7.9 mg/dL — AB (ref 8.9–10.3)
CO2: 19 mmol/L — ABNORMAL LOW (ref 22–32)
Chloride: 108 mmol/L (ref 101–111)
Creatinine, Ser: 6.28 mg/dL — ABNORMAL HIGH (ref 0.61–1.24)
GFR, EST AFRICAN AMERICAN: 11 mL/min — AB (ref >60)
GFR, EST NON AFRICAN AMERICAN: 9 mL/min — AB (ref >60)
GLUCOSE: 143 mg/dL — AB (ref 65–99)
POTASSIUM: 3.4 mmol/L — AB (ref 3.5–5.1)
Sodium: 141 mmol/L (ref 135–145)
TOTAL PROTEIN: 6.2 g/dL — AB (ref 6.5–8.1)

## 2015-04-07 LAB — GLUCOSE, CAPILLARY
GLUCOSE-CAPILLARY: 146 mg/dL — AB (ref 65–99)
GLUCOSE-CAPILLARY: 152 mg/dL — AB (ref 65–99)
GLUCOSE-CAPILLARY: 135 mg/dL — AB (ref 65–99)
Glucose-Capillary: 137 mg/dL — ABNORMAL HIGH (ref 65–99)
Glucose-Capillary: 147 mg/dL — ABNORMAL HIGH (ref 65–99)
Glucose-Capillary: 150 mg/dL — ABNORMAL HIGH (ref 65–99)

## 2015-04-07 LAB — CBC WITH DIFFERENTIAL/PLATELET
BASOS PCT: 0 %
Basophils Absolute: 0 10*3/uL (ref 0.0–0.1)
EOS ABS: 0.2 10*3/uL (ref 0.0–0.7)
EOS PCT: 6 %
HCT: 29.1 % — ABNORMAL LOW (ref 39.0–52.0)
Hemoglobin: 8.8 g/dL — ABNORMAL LOW (ref 13.0–17.0)
LYMPHS ABS: 0.7 10*3/uL (ref 0.7–4.0)
Lymphocytes Relative: 20 %
MCH: 32.2 pg (ref 26.0–34.0)
MCHC: 30.2 g/dL (ref 30.0–36.0)
MCV: 106.6 fL — ABNORMAL HIGH (ref 78.0–100.0)
Monocytes Absolute: 0.3 10*3/uL (ref 0.1–1.0)
Monocytes Relative: 10 %
Neutro Abs: 2 10*3/uL (ref 1.7–7.7)
Neutrophils Relative %: 64 %
PLATELETS: 43 10*3/uL — AB (ref 150–400)
RBC: 2.73 MIL/uL — AB (ref 4.22–5.81)
RDW: 16.9 % — ABNORMAL HIGH (ref 11.5–15.5)
WBC: 3.2 10*3/uL — AB (ref 4.0–10.5)

## 2015-04-07 LAB — CULTURE, BODY FLUID-BOTTLE

## 2015-04-07 LAB — CULTURE, BODY FLUID W GRAM STAIN -BOTTLE: Culture: NO GROWTH

## 2015-04-07 LAB — CULTURE, BLOOD (ROUTINE X 2): Culture: NO GROWTH

## 2015-04-07 LAB — AMMONIA: Ammonia: 48 umol/L — ABNORMAL HIGH (ref 9–35)

## 2015-04-07 LAB — MAGNESIUM: MAGNESIUM: 2 mg/dL (ref 1.7–2.4)

## 2015-04-07 LAB — VANCOMYCIN, RANDOM: VANCOMYCIN RM: 40 ug/mL

## 2015-04-07 LAB — PHOSPHORUS: PHOSPHORUS: 5.5 mg/dL — AB (ref 2.5–4.6)

## 2015-04-07 MED ORDER — PRO-STAT SUGAR FREE PO LIQD
40.0000 mL | Freq: Two times a day (BID) | ORAL | Status: DC
  Administered 2015-04-07 – 2015-04-09 (×5): 40 mL
  Filled 2015-04-07 (×5): qty 60

## 2015-04-07 MED ORDER — TRACE MINERALS CR-CU-MN-SE-ZN 10-1000-500-60 MCG/ML IV SOLN
INTRAVENOUS | Status: AC
  Administered 2015-04-07: 17:00:00 via INTRAVENOUS
  Filled 2015-04-07: qty 960

## 2015-04-07 MED ORDER — SODIUM CHLORIDE 0.45 % IV SOLN
INTRAVENOUS | Status: DC
  Administered 2015-04-08: 21:00:00 via INTRAVENOUS

## 2015-04-07 MED ORDER — SODIUM CHLORIDE 0.45 % IV SOLN
INTRAVENOUS | Status: DC
  Administered 2015-04-07: 09:00:00 via INTRAVENOUS

## 2015-04-07 NOTE — Progress Notes (Signed)
Follow up - Trauma and Critical Care  Patient Details:    Ricky Monforte. is an 50 y.o. male.  Lines/tubes : Airway 8 mm (Active)  Secured at (cm) 26 cm 04/07/2015  3:29 AM  Measured From Lips 04/07/2015  3:29 AM  Secured Location Right 04/07/2015  3:29 AM  Secured By [employer] 04/07/2015  3:29 AM  Tube Holder Repositioned Yes 04/07/2015  3:29 AM  Cuff Pressure (cm H2O) 24 cm H2O 04/06/2015 11:05 PM  Site Condition Dry 04/07/2015  3:29 AM     PICC Triple Lumen 03/07/15 PICC Right Basilic 48 cm 0 cm (Active)  Indication for Insertion or Continuance of Line Administration of hyperosmolar/irritating solutions (i.e. TPN, Vancomycin, etc.) 04/06/2015  8:00 PM  Exposed Catheter (cm) 0 cm 04/06/2015  8:00 AM  Site Assessment Clean;Dry;Intact 04/06/2015  8:00 PM  Lumen #1 Status Infusing 04/06/2015  8:00 PM  Lumen #2 Status Infusing 04/06/2015  8:00 PM  Lumen #3 Status Infusing 04/06/2015  8:00 PM  Dressing Type Transparent;Occlusive 04/06/2015  8:00 PM  Dressing Status Clean;Dry;Intact;Antimicrobial disc in place 04/06/2015  8:00 PM  Line Care Connections checked and tightened 04/06/2015  8:00 PM  Dressing Intervention New dressing;Dressing changed;Antimicrobial disc changed 04/04/2015 11:00 AM  Dressing Change Due 04-25-2015 04/06/2015  5:26 PM     Arterial Line 04/02/15 Left Radial (Active)  Site Assessment Clean;Dry;Intact 04/06/2015  8:00 PM  Line Status Pulsatile blood flow 04/06/2015  8:00 PM  Art Line Waveform Appropriate 04/06/2015  8:00 PM  Art Line Interventions Zeroed and calibrated;Leveled;Connections checked and tightened 04/06/2015  8:00 PM  Color/Movement/Sensation Capillary refill less than 3 sec;Cool fingers/toes 04/06/2015  8:00 PM  Dressing Type Transparent;Occlusive 04/06/2015  8:00 PM  Dressing Status Clean;Dry;Intact;Antimicrobial disc in place 04/06/2015  8:00 PM  Interventions New dressing;Tubing changed 04/06/2015 11:00 AM  Dressing Change Due 04/13/15 04/06/2015 11:00 AM     NG/OG Tube  Orogastric 14 Fr. Center mouth (Active)  Placement Verification Auscultation 04/07/2015 12:00 AM  Site Assessment Clean;Dry;Intact 04/07/2015 12:00 AM  Status Suction-low intermittent;Irrigated;Retaped 04/07/2015 12:00 AM  Amount of suction 125 mmHg 04/05/2015  7:30 AM  Drainage Appearance Yellow 04/07/2015 12:00 AM  Intake (mL) 30 mL 04/07/2015 12:00 AM  Output (mL) 200 mL 04/07/2015  4:00 AM     Rectal Tube/Pouch (Active)  Output (mL) 200 mL 04/07/2015  4:00 AM  Intake (mL) 0 mL 04/06/2015  6:45 PM     Urethral Catheter [name], NT Non-latex 14 Fr. (Active)  Indication for Insertion or Continuance of Catheter Unstable critical patients (first 24-48 hours) 04/07/2015 12:00 AM  Site Assessment Clean;Intact;Dry 04/07/2015 12:00 AM  Catheter Maintenance Catheter secured;Drainage bag/tubing not touching floor;Bag below level of bladder;Insertion date on drainage bag;No dependent loops;Seal intact 04/07/2015 12:00 AM  Collection Container Standard drainage bag 04/07/2015 12:00 AM  Securement Method Leg strap 04/07/2015 12:00 AM  Urinary Catheter Interventions Unclamped 04/07/2015 12:00 AM  Input (mL) 0 mL 04/05/2015  7:30 AM  Output (mL) 30 mL 04/07/2015  4:00 AM    Microbiology/Sepsis markers: Results for orders placed or performed during the hospital encounter of 03/07/2015  MRSA PCR Screening     Status: None   Collection Time: 03/06/15  5:23 AM  Result Value Ref Range Status   MRSA by PCR NEGATIVE NEGATIVE Final    Comment:        The GeneXpert MRSA Assay (FDA approved for NASAL specimens only), is one component of a comprehensive MRSA colonization surveillance program. It is  not intended to diagnose MRSA infection nor to guide or monitor treatment for MRSA infections.   Culture, respiratory (NON-Expectorated)     Status: None   Collection Time: 03/09/15 11:22 AM  Result Value Ref Range Status   Specimen Description TRACHEAL ASPIRATE  Final   Special Requests Normal  Final   Gram Stain   Final     ABUNDANT WBC PRESENT, PREDOMINANTLY PMN FEW SQUAMOUS EPITHELIAL CELLS PRESENT FEW GRAM POSITIVE COCCI IN PAIRS Performed at [facility]    Culture   Final    ABUNDANT GROUP B STREP(S.AGALACTIAE)ISOLATED Performed at [facility]    Report Status 03/11/2015 FINAL  Final  Culture, blood (Routine X 2) w Reflex to ID Panel     Status: None   Collection Time: 03/15/15 10:32 PM  Result Value Ref Range Status   Specimen Description BLOOD LEFT ANTECUBITAL  Final   Special Requests BOTTLES DRAWN AEROBIC AND ANAEROBIC 10CC  Final   Culture  Setup Time   Final    GRAM POSITIVE COCCI IN CLUSTERS ANAEROBIC BOTTLE ONLY CRITICAL RESULT CALLED TO, READ BACK BY AND VERIFIED WITH: M [facility] AT 1556 03/16/15 BY L BENFIELD    Culture   Final    STAPHYLOCOCCUS SPECIES (COAGULASE NEGATIVE) THE SIGNIFICANCE OF ISOLATING THIS ORGANISM FROM A SINGLE SET OF BLOOD CULTURES WHEN MULTIPLE SETS ARE DRAWN IS UNCERTAIN. PLEASE NOTIFY THE MICROBIOLOGY DEPARTMENT WITHIN ONE WEEK IF SPECIATION AND SENSITIVITIES ARE REQUIRED.    Report Status 03/19/2015 FINAL  Final  Culture, blood (Routine X 2) w Reflex to ID Panel     Status: None   Collection Time: 03/15/15 10:41 PM  Result Value Ref Range Status   Specimen Description BLOOD LEFT HAND  Final   Special Requests BOTTLES DRAWN AEROBIC ONLY 10CC  Final   Culture NO GROWTH 5 DAYS  Final   Report Status 03/20/2015 FINAL  Final  Culture, respiratory (NON-Expectorated)     Status: None   Collection Time: 03/16/15 12:02 AM  Result Value Ref Range Status   Specimen Description TRACHEAL ASPIRATE  Final   Special Requests NONE  Final   Gram Stain   Final    NO WBC SEEN NO SQUAMOUS EPITHELIAL CELLS SEEN NO ORGANISMS SEEN Performed at [facility]    Culture   Final    NORMAL OROPHARYNGEAL FLORA Performed at [facility]    Report Status 03/18/2015 FINAL  Final  Culture, blood (Routine X 2) w Reflex to ID Panel     Status: None    Collection Time: 03/20/15  1:30 PM  Result Value Ref Range Status   Specimen Description BLOOD LEFT ANTECUBITAL  Final   Special Requests BOTTLES DRAWN AEROBIC AND ANAEROBIC 10CC  Final   Culture NO GROWTH 5 DAYS  Final   Report Status 03/25/2015 FINAL  Final  Culture, blood (Routine X 2) w Reflex to ID Panel     Status: None   Collection Time: 03/20/15  1:40 PM  Result Value Ref Range Status   Specimen Description BLOOD LEFT HAND  Final   Special Requests BOTTLES DRAWN AEROBIC AND ANAEROBIC 10CC  Final   Culture NO GROWTH 5 DAYS  Final   Report Status 03/25/2015 FINAL  Final  C difficile quick scan w PCR reflex     Status: None   Collection Time: 03/25/15  2:01 PM  Result Value Ref Range Status   C Diff antigen NEGATIVE NEGATIVE Final   C Diff toxin NEGATIVE NEGATIVE  Final   C Diff interpretation Negative for toxigenic C. difficile  Final  Culture, blood (routine x 2)     Status: None   Collection Time: 04/02/15 12:04 AM  Result Value Ref Range Status   Specimen Description BLOOD LEFT ANTECUBITAL  Final   Special Requests BOTTLES DRAWN AEROBIC AND ANAEROBIC 5CC  Final   Culture  Setup Time   Final    GRAM POSITIVE RODS AEROBIC BOTTLE ONLY CRITICAL RESULT CALLED TO, READ BACK BY AND VERIFIED WITH: K ADKINS,RN AT 1431 04/03/15 BY L BENFIELD    Culture   Final    DIPHTHEROIDS(CORYNEBACTERIUM SPECIES) Standardized susceptibility testing for this organism is not available.    Report Status 04/05/2015 FINAL  Final  Culture, blood (routine x 2)     Status: None (Preliminary result)   Collection Time: 04/02/15 12:16 AM  Result Value Ref Range Status   Specimen Description BLOOD LEFT ANTECUBITAL  Final   Special Requests BOTTLES DRAWN AEROBIC AND ANAEROBIC 5CC  Final   Culture NO GROWTH 4 DAYS  Final   Report Status PENDING  Incomplete  Culture, body fluid-bottle     Status: None (Preliminary result)   Collection Time: 04/02/15  1:54 AM  Result Value Ref Range Status   Specimen  Description FLUID  Final   Special Requests PLEURAL AEROBIC BOTTLE ONLY  Final   Culture NO GROWTH 4 DAYS  Final   Report Status PENDING  Incomplete  Gram stain     Status: None   Collection Time: 04/02/15  1:54 AM  Result Value Ref Range Status   Specimen Description FLUID  Final   Special Requests PLEURAL  Final   Gram Stain   Final    CYTOSPIN SMEAR WBC PRESENT,BOTH PMN AND MONONUCLEAR NO ORGANISMS SEEN    Report Status 04/02/2015 FINAL  Final  Culture, respiratory (NON-Expectorated)     Status: None   Collection Time: 04/02/15  7:29 AM  Result Value Ref Range Status   Specimen Description TRACHEAL ASPIRATE  Final   Special Requests NONE  Final   Gram Stain   Final    FEW WBC PRESENT,BOTH PMN AND MONONUCLEAR RARE SQUAMOUS EPITHELIAL CELLS PRESENT FEW GRAM POSITIVE RODS Performed at [facility]    Culture   Final    NORMAL OROPHARYNGEAL FLORA Performed at [facility]    Report Status 04/04/2015 FINAL  Final    Anti-infectives:  Anti-infectives    Start     Dose/Rate Route Frequency Ordered Stop   04/06/15 1400  piperacillin-tazobactam (ZOSYN) IVPB 2.25 g     2.25 g 100 mL/hr over 30 Minutes Intravenous 3 times per day 04/06/15 1005     04/02/15 0800  piperacillin-tazobactam (ZOSYN) IVPB 3.375 g  Status:  Discontinued     3.375 g 12.5 mL/hr over 240 Minutes Intravenous 3 times per day 04/01/15 2356 04/06/15 1005   04/02/15 0800  vancomycin (VANCOCIN) 1,250 mg in sodium chloride 0.9 % 250 mL IVPB  Status:  Discontinued     1,250 mg 166.7 mL/hr over 90 Minutes Intravenous Every 8 hours 04/01/15 2356 04/03/15 1337   04/02/15 0000  piperacillin-tazobactam (ZOSYN) IVPB 3.375 g     3.375 g 100 mL/hr over 30 Minutes Intravenous STAT 04/01/15 2356 04/02/15 0137   04/02/15 0000  vancomycin (VANCOCIN) 2,000 mg in sodium chloride 0.9 % 500 mL IVPB     2,000 mg 250 mL/hr over 120 Minutes Intravenous STAT 04/01/15 2356 04/02/15 0230   03/18/15 0800  vancomycin  (VANCOCIN) 1,250 mg in sodium chloride 0.9 % 250 mL IVPB  Status:  Discontinued     1,250 mg 166.7 mL/hr over 90 Minutes Intravenous Every 8 hours 03/18/15 0750 03/19/15 1517   03/17/15 0930  cefTRIAXone (ROCEPHIN) 2 g in dextrose 5 % 50 mL IVPB  Status:  Discontinued     2 g 100 mL/hr over 30 Minutes Intravenous Every 24 hours 03/17/15 0915 03/22/15 1033   03/16/15 2000  vancomycin (VANCOCIN) 1,500 mg in sodium chloride 0.9 % 500 mL IVPB  Status:  Discontinued     1,500 mg 250 mL/hr over 120 Minutes Intravenous Every 24 hours 03/16/15 1929 03/18/15 0750   03/14/15 0900  cefTRIAXone (ROCEPHIN) 1 g in dextrose 5 % 50 mL IVPB     1 g 100 mL/hr over 30 Minutes Intravenous Every 24 hours 03/14/15 0835 03/16/15 0825   03/12/15 0900  cefTRIAXone (ROCEPHIN) injection 1 g  Status:  Discontinued     1 g Intramuscular Every 24 hours 03/12/15 0851 03/14/15 0835   03/09/15 1030  piperacillin-tazobactam (ZOSYN) IVPB 3.375 g  Status:  Discontinued     3.375 g 12.5 mL/hr over 240 Minutes Intravenous Every 8 hours 03/09/15 1000 03/12/15 1610      Best Practice/Protocols:  VTE Prophylaxis: Lovenox (prophylaxtic dose) GI Prophylaxis: Proton Pump Inhibitor Intermittent Sedation Continous Sedation  Consults: Treatment Team:  Leata Mouse, MD Palliative Remus Blake, MD    Events:  Subjective:    Overnight Issues: Family rescinded the DNR order placed over the weekend.  I have not had the opportunity to talk with them since this decision, but will work with them to move forward.  Objective:  Vital signs for last 24 hours: Temp:  [98.1 F (36.7 C)-98.6 F (37 C)] 98.4 F (36.9 C) (03/02 0400) Pulse Rate:  [87-112] 89 (03/02 0700) Resp:  [14-34] 20 (03/02 0700) BP: (95-141)/(52-91) 98/62 mmHg (03/02 0700) SpO2:  [94 %-100 %] 94 % (03/02 0700) Arterial Line BP: (106-186)/(42-90) 118/51 mmHg (03/02 0700) FiO2 (%):  [40 %] 40 % (03/02 0400) Weight:  [125.2 kg (276 lb 0.3  oz)] 125.2 kg (276 lb 0.3 oz) (03/02 0200)  Hemodynamic parameters for last 24 hours:    Intake/Output from previous day: 03/01 0701 - 03/02 0700 In: 3562.2 [I.V.:1125.7; NG/GT:820; IV Piggyback:362.5; TPN:1254] Out: 4950 [Urine:350; Emesis/NG output:1000; Stool:600]  Intake/Output this shift:    Vent settings for last 24 hours: Vent Mode:  [-] PRVC FiO2 (%):  [40 %] 40 % Set Rate:  [20 bmp] 20 bmp Vt Set:  [600 mL] 600 mL PEEP:  [5 cmH20] 5 cmH20 Pressure Support:  [10 cmH20] 10 cmH20 Plateau Pressure:  [16 cmH20-22 cmH20] 16 cmH20  Physical Exam:  General: no respiratory distress and somnolent Neuro: RASS 0 and RASS -1 Resp: clear to auscultation bilaterally CVS: regular rate and rhythm, S1, S2 normal, no murmur, click, rub or gallop GI: distended, hypoactive BS and 3 liters of ascites drained yesterday in IR Extremities: edema 3+  Results for orders placed or performed during the hospital encounter of 2015/03/07 (from the past 24 hour(s))  Glucose, capillary     Status: Abnormal   Collection Time: 04/06/15 12:53 PM  Result Value Ref Range   Glucose-Capillary 138 (H) 65 - 99 mg/dL   Comment 1 Capillary Specimen    Comment 2 Notify RN   Glucose, capillary     Status: Abnormal   Collection Time: 04/06/15  4:45 PM  Result Value Ref  Range   Glucose-Capillary 135 (H) 65 - 99 mg/dL  Glucose, capillary     Status: Abnormal   Collection Time: 04/06/15  7:33 PM  Result Value Ref Range   Glucose-Capillary 129 (H) 65 - 99 mg/dL   Comment 1 Capillary Specimen    Comment 2 Notify RN    Comment 3 Document in Chart   Glucose, capillary     Status: Abnormal   Collection Time: 04/07/15 12:03 AM  Result Value Ref Range   Glucose-Capillary 137 (H) 65 - 99 mg/dL   Comment 1 Capillary Specimen    Comment 2 Notify RN    Comment 3 Document in Chart   Comprehensive metabolic panel     Status: Abnormal   Collection Time: 04/07/15  3:00 AM  Result Value Ref Range   Sodium 141 135 - 145  mmol/L   Potassium 3.4 (L) 3.5 - 5.1 mmol/L   Chloride 108 101 - 111 mmol/L   CO2 19 (L) 22 - 32 mmol/L   Glucose, Bld 143 (H) 65 - 99 mg/dL   BUN 76 (H) 6 - 20 mg/dL   Creatinine, Ser 1.61 (H) 0.61 - 1.24 mg/dL   Calcium 7.9 (L) 8.9 - 10.3 mg/dL   Total Protein 6.2 (L) 6.5 - 8.1 g/dL   Albumin 2.1 (L) 3.5 - 5.0 g/dL   AST 096 (H) 15 - 41 U/L   ALT 76 (H) 17 - 63 U/L   Alkaline Phosphatase 67 38 - 126 U/L   Total Bilirubin 4.6 (H) 0.3 - 1.2 mg/dL   GFR calc non Af Amer 9 (L) >60 mL/min   GFR calc Af Amer 11 (L) >60 mL/min   Anion gap 14 5 - 15  Magnesium     Status: None   Collection Time: 04/07/15  3:00 AM  Result Value Ref Range   Magnesium 2.0 1.7 - 2.4 mg/dL  Phosphorus     Status: Abnormal   Collection Time: 04/07/15  3:00 AM  Result Value Ref Range   Phosphorus 5.5 (H) 2.5 - 4.6 mg/dL  CBC with Differential/Platelet     Status: Abnormal   Collection Time: 04/07/15  3:00 AM  Result Value Ref Range   WBC 3.2 (L) 4.0 - 10.5 K/uL   RBC 2.73 (L) 4.22 - 5.81 MIL/uL   Hemoglobin 8.8 (L) 13.0 - 17.0 g/dL   HCT 04.5 (L) 40.9 - 81.1 %   MCV 106.6 (H) 78.0 - 100.0 fL   MCH 32.2 26.0 - 34.0 pg   MCHC 30.2 30.0 - 36.0 g/dL   RDW 91.4 (H) 78.2 - 95.6 %   Platelets 43 (L) 150 - 400 K/uL   Neutrophils Relative % 64 %   Neutro Abs 2.0 1.7 - 7.7 K/uL   Lymphocytes Relative 20 %   Lymphs Abs 0.7 0.7 - 4.0 K/uL   Monocytes Relative 10 %   Monocytes Absolute 0.3 0.1 - 1.0 K/uL   Eosinophils Relative 6 %   Eosinophils Absolute 0.2 0.0 - 0.7 K/uL   Basophils Relative 0 %   Basophils Absolute 0.0 0.0 - 0.1 K/uL  Vancomycin, random     Status: None   Collection Time: 04/07/15  3:00 AM  Result Value Ref Range   Vancomycin Rm 40 ug/mL  Glucose, capillary     Status: Abnormal   Collection Time: 04/07/15  3:50 AM  Result Value Ref Range   Glucose-Capillary 146 (H) 65 - 99 mg/dL   Comment 1 Capillary Specimen  Comment 2 Notify RN    Comment 3 Document in Chart       Assessment/Plan:   NEURO  Altered Mental Status:  agitation and encephalopathy   Plan: Will recheck his ammonia level, but this is hepatic encephalopathy  PULM  Atelectasis/collapse (bilaterally at the bases.  Poor volumes per CXR)   Plan: Continue supportive care, but can wean and will talk with the family about extubation  CARDIO  No specific issues currently.   Plan: CPM  RENAL  Oliguria (suspect ATN and hepatorenal syndrome) Hypokalemia moderate (2.8 - 3.5 meq/dl)   Plan: No plans to replace KCl because of worsening renal function  GI  Hepatic Encephalopathy, Hepatic Failure and massive ascites from cirrhosis Mesenteric Ischemia and Omental injury   Plan: Had paracentesis yesterday of 3 liters and this did not decrease the size of his abdomen or the tenseness  ID  No known infectious problems.  Cultures have been negative.   Plan: CPM  HEME  Anemia and thrombocytopenia, liver disease   Plan: No blood or platelets for now.  ENDO No specific issues   Plan: CPM  Global Issues  Patient is hanging in there, but his renal function is certainly getting much worse.  The question is whether the family will want him to start dialysis.  Also, the patient has been weaning a bit on the ventilator, but if sedation is held he gets very agitated and bites the tube.  He may be required to get a tracheostomy, but again this depends on what the family's long term goals are.    LOS: 33 days   Additional comments:I reviewed the patient's new clinical lab test results. cbc/bmet and I reviewed the patients new imaging test results. cxr  Critical Care Total Time*: 30 Minutes  Tequlia Gonsalves 04/07/2015  *Care during the described time interval was provided by me and/or other providers on the critical care team.  I have reviewed this patient's available data, including medical history, events of note, physical examination and test results as part of my evaluation.
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
## 2015-04-07 NOTE — Progress Notes (Signed)
Patient placed on CPAP/PSV 5/5 with no complications. Nurse is aware and at bedside. Will continue to monitor pt.

## 2015-04-07 NOTE — Progress Notes (Signed)
Pharmacy: Vancomycin/Zosyn  49yom continues on day #4 vancomycin and zosyn for presumed aspiration pneumonia, for sepsis/intra-abd infection. He went into acute renal failure on 2/26 (sCr 0.49 > 2.61 > 4.1 > 4.96). SCr increased to 6.28 today. Renal consulted -plans supportive care for now & noted that short term CRRT may need to be considered although his prognosis is very poor  Last dose vancomycin  was given on 2/26 @ 0822  2/26 @ 1455 Vancomycin Trough = 50, doses held  2/28 Vancomycin Random = 48  Today 3/2 Vancomycin random level = 40   Plan: 1) Continue to hold vancomycin 2) Consider checking another level on 3/4 (vancomycin half-life ~43 hours) 3) Zosyn dose decreased on 04/06/15 to 2.25 g IV q8h   Noah Delaine, RPh Clinical Pharmacist Pager: [phone] 04/07/2015 1:16 PM

## 2015-04-07 NOTE — Progress Notes (Signed)
[facility] ROUNDING NOTE   Subjective:   Interval History:  DNR status revoked by wife after talking to palliative care -- ongoing discussions with family members are now in place  Objective:  Vital signs in last 24 hours:  Temp:  [97.6 F (36.4 C)-98.6 F (37 C)] 97.6 F (36.4 C) (03/02 0905) Pulse Rate:  [87-115] 115 (03/02 0905) Resp:  [17-34] 32 (03/02 0905) BP: (95-175)/(59-92) 175/86 mmHg (03/02 0905) SpO2:  [93 %-100 %] 93 % (03/02 0905) Arterial Line BP: (106-186)/(43-90) 170/74 mmHg (03/02 0900) FiO2 (%):  [40 %] 40 % (03/02 0905) Weight:  [125.2 kg (276 lb 0.3 oz)] 125.2 kg (276 lb 0.3 oz) (03/02 0200)  Weight change: -0.9 kg (-1 lb 15.8 oz) Filed Weights   04/05/15 0433 04/06/15 0300 04/07/15 0200  Weight: 125.4 kg (276 lb 7.3 oz) 126.1 kg (278 lb) 125.2 kg (276 lb 0.3 oz)    Intake/Output: I/O last 3 completed shifts: In: 5357.2 [I.V.:1845.7; NG/GT:1240; IV Piggyback:537.5] Out: 5475 [Urine:675; Emesis/NG output:1200; Other:3000; Stool:600]   Intake/Output this shift:  Total I/O In: 260.7 [I.V.:50.7; NG/GT:90; TPN:120] Out: -   icteric CVS- RRR RS- CTA diminished at base ABD- Tense with ascites  EXT- 3 + edema   Basic Metabolic Panel:  Recent Labs Lab 04/02/15 0409 04/03/15 0337 04/04/15 0400 04/05/15 0400 04/06/15 0550 04/07/15 0300  NA 150* 150* 143 142 140 141  K 3.8 3.3* 3.2* 3.4* 3.5 3.4*  CL 112* 114* 112* 109 108 108  CO2 21* 20* 19*  GLUCOSE 174* 155* 137* 137* 136* 143*  BUN 13 31* 49* 60* 71* 76*  CREATININE 0.49* 2.61* 4.10* 4.96* 5.82* 6.28*  CALCIUM 7.7* 7.3* 7.0* 7.3* 7.7* 7.9*  MG 1.9  --   --  2.0 2.0 2.0  PHOS 2.1*  --   --  4.7* 5.4* 5.5*    Liver Function Tests:  Recent Labs Lab 04/02/15 0409 04/03/15 0337 04/04/15 0400 04/06/15 0550 04/07/15 0300  AST 204* 225* 145* 153* 169*  ALT 106* 111* 87* 73* 76*  ALKPHOS 88 74 71 64 67  BILITOT 4.4* 5.8* 5.1* 4.8* 4.6*  PROT 6.8 6.6 6.5  6.3* 6.2*  ALBUMIN 1.7* 1.5* 1.4* 1.8* 2.1*   No results for input(s): LIPASE, AMYLASE in the last 168 hours.  Recent Labs Lab 04/02/15 0020 04/06/15 0430 04/07/15 0830  AMMONIA 80* 68* 48*    CBC:  Recent Labs Lab 04/02/15 0409 04/03/15 0337 04/04/15 0400 04/05/15 0400 04/06/15 0415 04/07/15 0300  WBC 6.6 10.4 8.8 4.1 3.4* 3.2*  NEUTROABS 4.2  --   --   --  2.2 2.0  HGB 10.2* 10.1* 9.8* 9.3* 9.4* 8.8*  HCT 35.4* 33.9* 32.7* 30.7* 32.3* 29.1*  MCV 113.8* 111.9* 109.7* 108.5* 111.4* 106.6*  PLT 73* 71* 78* 52* 41* 43*    Cardiac Enzymes: No results for input(s): CKTOTAL, CKMB, CKMBINDEX, TROPONINI in the last 168 hours.  BNP: Invalid input(s): POCBNP  CBG:  Recent Labs Lab 04/06/15 1645 04/06/15 1933 04/07/15 0003 04/07/15 0350 04/07/15 0905  GLUCAP 135* 129* 137* 146* 150*    Microbiology: Results for orders placed or performed during the hospital encounter of 03-23-15  MRSA PCR Screening     Status: None   Collection Time: 03/06/15  5:23 AM  Result Value Ref Range Status   MRSA by PCR NEGATIVE NEGATIVE Final    Comment:        The GeneXpert MRSA Assay (FDA approved for NASAL specimens  only), is one component of a comprehensive MRSA colonization surveillance program. It is not intended to diagnose MRSA infection nor to guide or monitor treatment for MRSA infections.   Culture, respiratory (NON-Expectorated)     Status: None   Collection Time: 03/09/15 11:22 AM  Result Value Ref Range Status   Specimen Description TRACHEAL ASPIRATE  Final   Special Requests Normal  Final   Gram Stain   Final    ABUNDANT WBC PRESENT, PREDOMINANTLY PMN FEW SQUAMOUS EPITHELIAL CELLS PRESENT FEW GRAM POSITIVE COCCI IN PAIRS Performed at [facility]    Culture   Final    ABUNDANT GROUP B STREP(S.AGALACTIAE)ISOLATED Performed at [facility]    Report Status 03/11/2015 FINAL  Final  Culture, blood (Routine X 2) w Reflex to ID Panel      Status: None   Collection Time: 03/15/15 10:32 PM  Result Value Ref Range Status   Specimen Description BLOOD LEFT ANTECUBITAL  Final   Special Requests BOTTLES DRAWN AEROBIC AND ANAEROBIC 10CC  Final   Culture  Setup Time   Final    GRAM POSITIVE COCCI IN CLUSTERS ANAEROBIC BOTTLE ONLY CRITICAL RESULT CALLED TO, READ BACK BY AND VERIFIED WITH: M [facility] AT 1556 03/16/15 BY L BENFIELD    Culture   Final    STAPHYLOCOCCUS SPECIES (COAGULASE NEGATIVE) THE SIGNIFICANCE OF ISOLATING THIS ORGANISM FROM A SINGLE SET OF BLOOD CULTURES WHEN MULTIPLE SETS ARE DRAWN IS UNCERTAIN. PLEASE NOTIFY THE MICROBIOLOGY DEPARTMENT WITHIN ONE WEEK IF SPECIATION AND SENSITIVITIES ARE REQUIRED.    Report Status 03/19/2015 FINAL  Final  Culture, blood (Routine X 2) w Reflex to ID Panel     Status: None   Collection Time: 03/15/15 10:41 PM  Result Value Ref Range Status   Specimen Description BLOOD LEFT HAND  Final   Special Requests BOTTLES DRAWN AEROBIC ONLY 10CC  Final   Culture NO GROWTH 5 DAYS  Final   Report Status 03/20/2015 FINAL  Final  Culture, respiratory (NON-Expectorated)     Status: None   Collection Time: 03/16/15 12:02 AM  Result Value Ref Range Status   Specimen Description TRACHEAL ASPIRATE  Final   Special Requests NONE  Final   Gram Stain   Final    NO WBC SEEN NO SQUAMOUS EPITHELIAL CELLS SEEN NO ORGANISMS SEEN Performed at [facility]    Culture   Final    NORMAL OROPHARYNGEAL FLORA Performed at [facility]    Report Status 03/18/2015 FINAL  Final  Culture, blood (Routine X 2) w Reflex to ID Panel     Status: None   Collection Time: 03/20/15  1:30 PM  Result Value Ref Range Status   Specimen Description BLOOD LEFT ANTECUBITAL  Final   Special Requests BOTTLES DRAWN AEROBIC AND ANAEROBIC 10CC  Final   Culture NO GROWTH 5 DAYS  Final   Report Status 03/25/2015 FINAL  Final  Culture, blood (Routine X 2) w Reflex to ID Panel     Status: None   Collection Time:  03/20/15  1:40 PM  Result Value Ref Range Status   Specimen Description BLOOD LEFT HAND  Final   Special Requests BOTTLES DRAWN AEROBIC AND ANAEROBIC 10CC  Final   Culture NO GROWTH 5 DAYS  Final   Report Status 03/25/2015 FINAL  Final  C difficile quick scan w PCR reflex     Status: None   Collection Time: 03/25/15  2:01 PM  Result Value Ref Range Status  C Diff antigen NEGATIVE NEGATIVE Final   C Diff toxin NEGATIVE NEGATIVE Final   C Diff interpretation Negative for toxigenic C. difficile  Final  Culture, blood (routine x 2)     Status: None   Collection Time: 04/02/15 12:04 AM  Result Value Ref Range Status   Specimen Description BLOOD LEFT ANTECUBITAL  Final   Special Requests BOTTLES DRAWN AEROBIC AND ANAEROBIC 5CC  Final   Culture  Setup Time   Final    GRAM POSITIVE RODS AEROBIC BOTTLE ONLY CRITICAL RESULT CALLED TO, READ BACK BY AND VERIFIED WITH: K ADKINS,RN AT 1431 04/03/15 BY L BENFIELD    Culture   Final    DIPHTHEROIDS(CORYNEBACTERIUM SPECIES) Standardized susceptibility testing for this organism is not available.    Report Status 04/05/2015 FINAL  Final  Culture, blood (routine x 2)     Status: None (Preliminary result)   Collection Time: 04/02/15 12:16 AM  Result Value Ref Range Status   Specimen Description BLOOD LEFT ANTECUBITAL  Final   Special Requests BOTTLES DRAWN AEROBIC AND ANAEROBIC 5CC  Final   Culture NO GROWTH 4 DAYS  Final   Report Status PENDING  Incomplete  Culture, body fluid-bottle     Status: None (Preliminary result)   Collection Time: 04/02/15  1:54 AM  Result Value Ref Range Status   Specimen Description FLUID  Final   Special Requests PLEURAL AEROBIC BOTTLE ONLY  Final   Culture NO GROWTH 4 DAYS  Final   Report Status PENDING  Incomplete  Gram stain     Status: None   Collection Time: 04/02/15  1:54 AM  Result Value Ref Range Status   Specimen Description FLUID  Final   Special Requests PLEURAL  Final   Gram Stain   Final     CYTOSPIN SMEAR WBC PRESENT,BOTH PMN AND MONONUCLEAR NO ORGANISMS SEEN    Report Status 04/02/2015 FINAL  Final  Culture, respiratory (NON-Expectorated)     Status: None   Collection Time: 04/02/15  7:29 AM  Result Value Ref Range Status   Specimen Description TRACHEAL ASPIRATE  Final   Special Requests NONE  Final   Gram Stain   Final    FEW WBC PRESENT,BOTH PMN AND MONONUCLEAR RARE SQUAMOUS EPITHELIAL CELLS PRESENT FEW GRAM POSITIVE RODS Performed at [facility]    Culture   Final    NORMAL OROPHARYNGEAL FLORA Performed at [facility]    Report Status 04/04/2015 FINAL  Final    Coagulation Studies: No results for input(s): LABPROT, INR in the last 72 hours.  Urinalysis: No results for input(s): COLORURINE, LABSPEC, PHURINE, GLUCOSEU, HGBUR, BILIRUBINUR, KETONESUR, PROTEINUR, UROBILINOGEN, NITRITE, LEUKOCYTESUR in the last 72 hours.  Invalid input(s): APPERANCEUR    Imaging: US Paracentesis  04/06/2015  INDICATION: Ascites secondary to cirrhosis. Request for therapeutic paracentesis. 3 liter limit due to hypotension. EXAM: ULTRASOUND GUIDED LEFT LOWER QUADRANT PARACENTESIS MEDICATIONS: 1% Lidocaine. COMPLICATIONS: None immediate. PROCEDURE: Informed written consent was obtained from the patient after a discussion of the risks, benefits and alternatives to treatment. A timeout was performed prior to the initiation of the procedure. Initial ultrasound scanning demonstrates a large amount of ascites within the right lower abdominal quadrant. The right lower abdomen was prepped and draped in the usual sterile fashion. 1% lidocaine with epinephrine was used for local anesthesia. Following this, a 6 Fr Safe-T-Centesis catheter was introduced. An ultrasound image was saved for documentation purposes. The paracentesis was performed. The catheter was removed and a dressing was  applied. The patient tolerated the procedure well without immediate post procedural complication.  FINDINGS: A total of approximately 3 liters of clear yellow fluid was removed. IMPRESSION: Successful ultrasound-guided paracentesis yielding 3 liters of peritoneal fluid. Read by:  Corrin Parker, PA-C Electronically Signed   By: Irish Lack M.D.   On: 04/06/2015 15:52   Dg Chest Port 1 View  04/07/2015  CLINICAL DATA:  Respiratory failure. EXAM: PORTABLE CHEST 1 VIEW COMPARISON:  04/05/2015 . FINDINGS: Endotracheal tube, feeding tube, right PICC line in stable position. Cardiomegaly. Mild bilateral pulmonary infiltrates and/or pulmonary edema again noted. Low lung volumes with basilar atelectasis. Small left pleural effusion cannot be excluded . IMPRESSION: 1. Lines and tubes in stable position. 2. Cardiomegaly with persistent bilateral pulmonary infiltrates and/or edema without interim change. Low lung volumes with basilar atelectasis. Small left pleural effusion . Electronically Signed   By: Maisie Fus  Register   On: 04/07/2015 07:27     Medications:   . sodium chloride 40 mL/hr at 04/07/15 0900  . sodium chloride    . feeding supplement (VITAL HIGH PROTEIN) 1,000 mL (04/07/15 0900)  . norepinephrine (LEVOPHED) Adult infusion Stopped (04/06/15 2300)  . phenylephrine (NEO-SYNEPHRINE) Adult infusion Stopped (04/04/15 2000)  . TPN (CLINIMIX) Adult without lytes 60 mL/hr at 04/07/15 0900  . TPN (CLINIMIX) Adult without lytes     . albumin human  12.5 g Intravenous Q6H  . antiseptic oral rinse  7 mL Mouth Rinse QID  . chlorhexidine gluconate  15 mL Mouth Rinse BID  . escitalopram  20 mg Per Tube Daily  . feeding supplement (PRO-STAT SUGAR FREE 64)  40 mL Per Tube BID  . folic acid  1 mg Per Tube Daily  . insulin aspart  3-18 Units Subcutaneous 6 times per day  . insulin glargine  15 Units Subcutaneous BID  . lactulose  30 g Per Tube 3 times per day  . levETIRAcetam  500 mg Per Tube BID  . pantoprazole sodium  40 mg Per Tube Daily  . piperacillin-tazobactam (ZOSYN)  IV  2.25 g Intravenous 3  times per day  . thiamine  100 mg Per Tube Daily   Place/Maintain arterial line **AND** sodium chloride, fentaNYL (SUBLIMAZE) injection, Gerhardt's butt cream, ipratropium-albuterol, metoprolol, midazolam, [DISCONTINUED] ondansetron **OR** ondansetron (ZOFRAN) IV, sodium chloride flush  Assessment/ Plan:   Acute kidney Injury with hypotension and liver disease following paracentesis -- this may indicate hepatorenal syndrome versus ATN -- the plan would be supportive care for now. Extubation planned -- dialysis would need to be considered if supported by family and trauma MD  Electrolytes are all stable  Hypokalemia is mild  Albumin and pressors have been used for hepatorenal syndrome with some mixed success.   Discussed with Dr Neale Burly -- short term CRRT may need to be considered although his prognosis is very poor    LOS: 33 Tricia Pledger W @TODAY @9 :29 AM

## 2015-04-08 LAB — GLUCOSE, CAPILLARY
GLUCOSE-CAPILLARY: 107 mg/dL — AB (ref 65–99)
GLUCOSE-CAPILLARY: 111 mg/dL — AB (ref 65–99)
GLUCOSE-CAPILLARY: 158 mg/dL — AB (ref 65–99)
Glucose-Capillary: 155 mg/dL — ABNORMAL HIGH (ref 65–99)
Glucose-Capillary: 133 mg/dL — ABNORMAL HIGH (ref 65–99)
Glucose-Capillary: 125 mg/dL — ABNORMAL HIGH (ref 65–99)
Glucose-Capillary: 118 mg/dL — ABNORMAL HIGH (ref 65–99)

## 2015-04-08 LAB — BASIC METABOLIC PANEL
ANION GAP: 13 (ref 5–15)
BUN: 88 mg/dL — ABNORMAL HIGH (ref 6–20)
CHLORIDE: 108 mmol/L (ref 101–111)
CO2: 19 mmol/L — ABNORMAL LOW (ref 22–32)
Calcium: 8.3 mg/dL — ABNORMAL LOW (ref 8.9–10.3)
Creatinine, Ser: 6.33 mg/dL — ABNORMAL HIGH (ref 0.61–1.24)
GFR, EST AFRICAN AMERICAN: 11 mL/min — AB (ref >60)
GFR, EST NON AFRICAN AMERICAN: 9 mL/min — AB (ref >60)
Glucose, Bld: 167 mg/dL — ABNORMAL HIGH (ref 65–99)
POTASSIUM: 3.5 mmol/L (ref 3.5–5.1)
SODIUM: 140 mmol/L (ref 135–145)

## 2015-04-08 LAB — SODIUM, URINE, RANDOM: Sodium, Ur: 19 mmol/L

## 2015-04-08 LAB — AMMONIA: AMMONIA: 62 umol/L — AB (ref 9–35)

## 2015-04-08 MED ORDER — TRACE MINERALS CR-CU-MN-SE-ZN 10-1000-500-60 MCG/ML IV SOLN
INTRAVENOUS | Status: DC
  Administered 2015-04-08: 18:00:00 via INTRAVENOUS
  Filled 2015-04-08: qty 960
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
## 2015-04-08 NOTE — Progress Notes (Signed)
Patient placed on CPAP/PSV per protocol 5/5 with no complications. Nurse is aware, will continue to monitor pt.

## 2015-04-08 NOTE — Progress Notes (Signed)
Pt with continued encephalopathy; not following commands and unable to support airway.  Pt on TF and TPN at this time; wife has returned code status to full code.  Have consulted both [facility] for possible admission...will start insurance auth once MD feels pt is medically stable for dc to LTAC, likely next week.  Will discuss LTAC with spouse once pt more medically ready for transfer.  Will follow progress.  Quintella BatonJulie W. Haileyann Staiger, RN, BSN  Trauma/Neuro ICU Case Manager [phone]

## 2015-04-08 NOTE — Progress Notes (Signed)
[facility] ROUNDING NOTE   Subjective:   Interval History:  Plan dialysis today   Objective:  Vital signs in last 24 hours:  Temp:  [96.3 F (35.7 C)-98.4 F (36.9 C)] 96.3 F (35.7 C) (03/03 0900) Pulse Rate:  [88-120] 91 (03/03 0809) Resp:  [15-33] 20 (03/03 0809) BP: (110-164)/(66-100) 164/69 mmHg (03/03 0700) SpO2:  [91 %-99 %] 98 % (03/03 0810) Arterial Line BP: (119-168)/(66-88) 166/71 mmHg (03/03 0700) FiO2 (%):  [40 %] 40 % (03/03 0810) Weight:  [126.5 kg (278 lb 14.1 oz)] 126.5 kg (278 lb 14.1 oz) (03/03 0421)  Weight change: 1.3 kg (2 lb 13.9 oz) Filed Weights   04/06/15 0300 04/07/15 0200 04/08/15 0421  Weight: 126.1 kg (278 lb) 125.2 kg (276 lb 0.3 oz) 126.5 kg (278 lb 14.1 oz)    Intake/Output: I/O last 3 completed shifts: In: 5583.3 [I.V.:1843.3; NG/GT:1390; IV Piggyback:550] Out: 3415 [Urine:315; Emesis/NG output:2100; Stool:1000]   Intake/Output this shift:     icteric CVS- RRR RS- CTA diminished at base ABD- Tense with ascites  EXT- 3 + edema   Basic Metabolic Panel:  Recent Labs Lab 04/02/15 0409  04/04/15 0400 04/05/15 0400 04/06/15 0550 04/07/15 0300 04/08/15 0427  NA 150*  < > 143 142 140 141 140  K 3.8  < > 3.2* 3.4* 3.5 3.4* 3.5  CL 112*  < > 112* 109 108 108 108  CO2 29  < > 22 21* 20* 19* 19*  GLUCOSE 174*  < > 137* 137* 136* 143* 167*  BUN 13  < > 49* 60* 71* 76* 88*  CREATININE 0.49*  < > 4.10* 4.96* 5.82* 6.28* 6.33*  CALCIUM 7.7*  < > 7.0* 7.3* 7.7* 7.9* 8.3*  MG 1.9  --   --  2.0 2.0 2.0  --   PHOS 2.1*  --   --  4.7* 5.4* 5.5*  --   < > = values in this interval not displayed.  Liver Function Tests:  Recent Labs Lab 04/02/15 0409 04/03/15 0337 04/04/15 0400 04/06/15 0550 04/07/15 0300  AST 204* 225* 145* 153* 169*  ALT 106* 111* 87* 73* 76*  ALKPHOS 88 74 71 64 67  BILITOT 4.4* 5.8* 5.1* 4.8* 4.6*  PROT 6.8 6.6 6.5 6.3* 6.2*  ALBUMIN 1.7* 1.5* 1.4* 1.8* 2.1*   No results for input(s):  LIPASE, AMYLASE in the last 168 hours.  Recent Labs Lab 04/06/15 0430 04/07/15 0830 04/08/15 0410  AMMONIA 68* 48* 62*    CBC:  Recent Labs Lab 04/02/15 0409 04/03/15 0337 04/04/15 0400 04/05/15 0400 04/06/15 0415 04/07/15 0300  WBC 6.6 10.4 8.8 4.1 3.4* 3.2*  NEUTROABS 4.2  --   --   --  2.2 2.0  HGB 10.2* 10.1* 9.8* 9.3* 9.4* 8.8*  HCT 35.4* 33.9* 32.7* 30.7* 32.3* 29.1*  MCV 113.8* 111.9* 109.7* 108.5* 111.4* 106.6*  PLT 73* 71* 78* 52* 41* 43*    Cardiac Enzymes: No results for input(s): CKTOTAL, CKMB, CKMBINDEX, TROPONINI in the last 168 hours.  BNP: Invalid input(s): POCBNP  CBG:  Recent Labs Lab 04/07/15 1627 04/07/15 1916 04/08/15 0012 04/08/15 0415 04/08/15 0928  GLUCAP 147* 135* 158* 155* 133*    Microbiology: Results for orders placed or performed during the hospital encounter of 02/27/2015  MRSA PCR Screening     Status: None   Collection Time: 03/06/15  5:23 AM  Result Value Ref Range Status   MRSA by PCR NEGATIVE NEGATIVE Final    Comment:  The GeneXpert MRSA Assay (FDA approved for NASAL specimens only), is one component of a comprehensive MRSA colonization surveillance program. It is not intended to diagnose MRSA infection nor to guide or monitor treatment for MRSA infections.   Culture, respiratory (NON-Expectorated)     Status: None   Collection Time: 03/09/15 11:22 AM  Result Value Ref Range Status   Specimen Description TRACHEAL ASPIRATE  Final   Special Requests Normal  Final   Gram Stain   Final    ABUNDANT WBC PRESENT, PREDOMINANTLY PMN FEW SQUAMOUS EPITHELIAL CELLS PRESENT FEW GRAM POSITIVE COCCI IN PAIRS Performed at [facility]    Culture   Final    ABUNDANT GROUP B STREP(S.AGALACTIAE)ISOLATED Performed at [facility]    Report Status 03/11/2015 FINAL  Final  Culture, blood (Routine X 2) w Reflex to ID Panel     Status: None   Collection Time: 03/15/15 10:32 PM  Result Value Ref Range  Status   Specimen Description BLOOD LEFT ANTECUBITAL  Final   Special Requests BOTTLES DRAWN AEROBIC AND ANAEROBIC 10CC  Final   Culture  Setup Time   Final    GRAM POSITIVE COCCI IN CLUSTERS ANAEROBIC BOTTLE ONLY CRITICAL RESULT CALLED TO, READ BACK BY AND VERIFIED WITH: M [facility] AT 1556 03/16/15 BY L BENFIELD    Culture   Final    STAPHYLOCOCCUS SPECIES (COAGULASE NEGATIVE) THE SIGNIFICANCE OF ISOLATING THIS ORGANISM FROM A SINGLE SET OF BLOOD CULTURES WHEN MULTIPLE SETS ARE DRAWN IS UNCERTAIN. PLEASE NOTIFY THE MICROBIOLOGY DEPARTMENT WITHIN ONE WEEK IF SPECIATION AND SENSITIVITIES ARE REQUIRED.    Report Status 03/19/2015 FINAL  Final  Culture, blood (Routine X 2) w Reflex to ID Panel     Status: None   Collection Time: 03/15/15 10:41 PM  Result Value Ref Range Status   Specimen Description BLOOD LEFT HAND  Final   Special Requests BOTTLES DRAWN AEROBIC ONLY 10CC  Final   Culture NO GROWTH 5 DAYS  Final   Report Status 03/20/2015 FINAL  Final  Culture, respiratory (NON-Expectorated)     Status: None   Collection Time: 03/16/15 12:02 AM  Result Value Ref Range Status   Specimen Description TRACHEAL ASPIRATE  Final   Special Requests NONE  Final   Gram Stain   Final    NO WBC SEEN NO SQUAMOUS EPITHELIAL CELLS SEEN NO ORGANISMS SEEN Performed at [facility]    Culture   Final    NORMAL OROPHARYNGEAL FLORA Performed at [facility]    Report Status 03/18/2015 FINAL  Final  Culture, blood (Routine X 2) w Reflex to ID Panel     Status: None   Collection Time: 03/20/15  1:30 PM  Result Value Ref Range Status   Specimen Description BLOOD LEFT ANTECUBITAL  Final   Special Requests BOTTLES DRAWN AEROBIC AND ANAEROBIC 10CC  Final   Culture NO GROWTH 5 DAYS  Final   Report Status 03/25/2015 FINAL  Final  Culture, blood (Routine X 2) w Reflex to ID Panel     Status: None   Collection Time: 03/20/15  1:40 PM  Result Value Ref Range Status   Specimen Description  BLOOD LEFT HAND  Final   Special Requests BOTTLES DRAWN AEROBIC AND ANAEROBIC 10CC  Final   Culture NO GROWTH 5 DAYS  Final   Report Status 03/25/2015 FINAL  Final  C difficile quick scan w PCR reflex     Status: None   Collection Time: 03/25/15  2:01  PM  Result Value Ref Range Status   C Diff antigen NEGATIVE NEGATIVE Final   C Diff toxin NEGATIVE NEGATIVE Final   C Diff interpretation Negative for toxigenic C. difficile  Final  Culture, blood (routine x 2)     Status: None   Collection Time: 04/02/15 12:04 AM  Result Value Ref Range Status   Specimen Description BLOOD LEFT ANTECUBITAL  Final   Special Requests BOTTLES DRAWN AEROBIC AND ANAEROBIC 5CC  Final   Culture  Setup Time   Final    GRAM POSITIVE RODS AEROBIC BOTTLE ONLY CRITICAL RESULT CALLED TO, READ BACK BY AND VERIFIED WITH: K ADKINS,RN AT 1431 04/03/15 BY L BENFIELD    Culture   Final    DIPHTHEROIDS(CORYNEBACTERIUM SPECIES) Standardized susceptibility testing for this organism is not available.    Report Status 04/05/2015 FINAL  Final  Culture, blood (routine x 2)     Status: None   Collection Time: 04/02/15 12:16 AM  Result Value Ref Range Status   Specimen Description BLOOD LEFT ANTECUBITAL  Final   Special Requests BOTTLES DRAWN AEROBIC AND ANAEROBIC 5CC  Final   Culture NO GROWTH 5 DAYS  Final   Report Status 04/07/2015 FINAL  Final  Culture, body fluid-bottle     Status: None   Collection Time: 04/02/15  1:54 AM  Result Value Ref Range Status   Specimen Description FLUID  Final   Special Requests PLEURAL AEROBIC BOTTLE ONLY  Final   Culture NO GROWTH 5 DAYS  Final   Report Status 04/07/2015 FINAL  Final  Gram stain     Status: None   Collection Time: 04/02/15  1:54 AM  Result Value Ref Range Status   Specimen Description FLUID  Final   Special Requests PLEURAL  Final   Gram Stain   Final    CYTOSPIN SMEAR WBC PRESENT,BOTH PMN AND MONONUCLEAR NO ORGANISMS SEEN    Report Status 04/02/2015 FINAL  Final   Culture, respiratory (NON-Expectorated)     Status: None   Collection Time: 04/02/15  7:29 AM  Result Value Ref Range Status   Specimen Description TRACHEAL ASPIRATE  Final   Special Requests NONE  Final   Gram Stain   Final    FEW WBC PRESENT,BOTH PMN AND MONONUCLEAR RARE SQUAMOUS EPITHELIAL CELLS PRESENT FEW GRAM POSITIVE RODS [facility]    Culture   Final    NORMAL OROPHARYNGEAL FLORA Performed at [facility]    Report Status 04/04/2015 FINAL  Final    Coagulation Studies: No results for input(s): LABPROT, INR in the last 72 hours.  Urinalysis: No results for input(s): COLORURINE, LABSPEC, PHURINE, GLUCOSEU, HGBUR, BILIRUBINUR, KETONESUR, PROTEINUR, UROBILINOGEN, NITRITE, LEUKOCYTESUR in the last 72 hours.  Invalid input(s): APPERANCEUR    Imaging: [place] Paracentesis  04/06/2015  INDICATION: Ascites secondary to cirrhosis. Request for therapeutic paracentesis. 3 liter limit due to hypotension. EXAM: ULTRASOUND GUIDED LEFT LOWER QUADRANT PARACENTESIS MEDICATIONS: 1% Lidocaine. COMPLICATIONS: None immediate. PROCEDURE: Informed written consent was obtained from the patient after a discussion of the risks, benefits and alternatives to treatment. A timeout was performed prior to the initiation of the procedure. Initial ultrasound scanning demonstrates a large amount of ascites within the right lower abdominal quadrant. The right lower abdomen was prepped and draped in the usual sterile fashion. 1% lidocaine with epinephrine was used for local anesthesia. Following this, a 6 Fr Safe-T-Centesis catheter was introduced. An ultrasound image was saved for documentation purposes. The paracentesis was performed. The  catheter was removed and a dressing was applied. The patient tolerated the procedure well without immediate post procedural complication. FINDINGS: A total of approximately 3 liters of clear yellow fluid was removed. IMPRESSION: Successful  ultrasound-guided paracentesis yielding 3 liters of peritoneal fluid. Read by:  Corrin Parker, PA-C Electronically Signed   By: Irish Lack M.D.   On: 04/06/2015 15:52   Dg Chest Port 1 View  04/07/2015  CLINICAL DATA:  Respiratory failure. EXAM: PORTABLE CHEST 1 VIEW COMPARISON:  04/05/2015 . FINDINGS: Endotracheal tube, feeding tube, right PICC line in stable position. Cardiomegaly. Mild bilateral pulmonary infiltrates and/or pulmonary edema again noted. Low lung volumes with basilar atelectasis. Small left pleural effusion cannot be excluded . IMPRESSION: 1. Lines and tubes in stable position. 2. Cardiomegaly with persistent bilateral pulmonary infiltrates and/or edema without interim change. Low lung volumes with basilar atelectasis. Small left pleural effusion . Electronically Signed   By: Maisie Fus  Register   On: 04/07/2015 07:27     Medications:   . sodium chloride 60 mL/hr at 04/07/15 1900  . feeding supplement (VITAL HIGH PROTEIN) 1,000 mL (04/07/15 1900)  . norepinephrine (LEVOPHED) Adult infusion Stopped (04/06/15 2300)  . phenylephrine (NEO-SYNEPHRINE) Adult infusion Stopped (04/04/15 2000)  . TPN (CLINIMIX) Adult without lytes 40 mL/hr at 04/07/15 1714  . TPN (CLINIMIX) Adult without lytes     . albumin human  12.5 g Intravenous Q6H  . antiseptic oral rinse  7 mL Mouth Rinse QID  . chlorhexidine gluconate  15 mL Mouth Rinse BID  . escitalopram  20 mg Per Tube Daily  . feeding supplement (PRO-STAT SUGAR FREE 64)  40 mL Per Tube BID  . folic acid  1 mg Per Tube Daily  . insulin aspart  3-18 Units Subcutaneous 6 times per day  . insulin glargine  15 Units Subcutaneous BID  . lactulose  30 g Per Tube 3 times per day  . levETIRAcetam  500 mg Per Tube BID  . pantoprazole sodium  40 mg Per Tube Daily  . piperacillin-tazobactam (ZOSYN)  IV  2.25 g Intravenous 3 times per day  . 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   Discussed with Dr Neale Burly -- short term dialysis  BP would tolerate HD   LOS: 34 Ricky Molina W @TODAY @9 :58 AM

## 2015-04-08 NOTE — Progress Notes (Signed)
Follow up - Trauma and Critical Care  Patient Details:    Ricky Molina. is an 50 y.o. male.  Lines/tubes : Airway 8 mm (Active)  Secured at (cm) 26 cm 04/08/2015  8:10 AM  Measured From Lips 04/08/2015  8:10 AM  Secured Location Left 04/08/2015  8:10 AM  Secured By [employer] 04/08/2015  8:10 AM  Tube Holder Repositioned Yes 04/08/2015  8:10 AM  Cuff Pressure (cm H2O) 28 cm H2O 04/07/2015  9:05 AM  Site Condition Dry 04/08/2015  8:10 AM     PICC Triple Lumen 03/07/15 PICC Right Basilic 48 cm 0 cm (Active)  Indication for Insertion or Continuance of Line Administration of hyperosmolar/irritating solutions (i.e. TPN, Vancomycin, etc.);Chronic illness with exacerbations (CF, Sickle Cell, etc.) 04/08/2015  8:00 AM  Exposed Catheter (cm) 0 cm 04/06/2015  8:00 AM  Site Assessment Clean;Dry;Intact 04/07/2015  8:00 PM  Lumen #1 Status Infusing 04/07/2015  8:00 PM  Lumen #2 Status Infusing 04/07/2015  8:00 PM  Lumen #3 Status Infusing 04/07/2015  8:00 PM  Dressing Type Transparent;Occlusive 04/07/2015  8:00 PM  Dressing Status Clean;Dry;Intact;Antimicrobial disc in place 04/07/2015  8:00 PM  Line Care Connections checked and tightened;Line pulled back;Zeroed and calibrated;Leveled 04/07/2015  8:00 PM  Dressing Intervention New dressing;Dressing changed;Antimicrobial disc changed 04/04/2015 11:00 AM  Dressing Change Due 13-Apr-2015 04/07/2015  8:00 PM     Arterial Line 04/02/15 Left Radial (Active)  Site Assessment Clean;Dry;Intact 04/07/2015  8:00 PM  Line Status Pulsatile blood flow 04/07/2015  8:00 PM  Art Line Waveform Appropriate;Square wave test performed 04/07/2015  8:00 PM  Art Line Interventions Zeroed and calibrated;Leveled;Connections checked and tightened;Flushed per protocol;Line pulled back 04/07/2015  8:00 PM  Color/Movement/Sensation Capillary refill less than 3 sec;Cool fingers/toes 04/07/2015  8:00 PM  Dressing Type Transparent;Occlusive 04/07/2015  8:00 PM  Dressing Status  Clean;Dry;Intact;Antimicrobial disc in place 04/07/2015  8:00 PM  Interventions New dressing;Tubing changed 04/06/2015 11:00 AM  Dressing Change Due 04/13/15 04/07/2015  8:00 PM     NG/OG Tube Orogastric 14 Fr. Center mouth (Active)  Placement Verification Auscultation 04/07/2015  8:00 PM  Site Assessment Clean;Dry;Intact 04/07/2015  8:00 PM  Status Suction-low intermittent;Irrigated;Retaped 04/07/2015  8:00 PM  Amount of suction 125 mmHg 04/05/2015  7:30 AM  Drainage Appearance Yellow 04/07/2015  8:00 PM  Intake (mL) 100 mL 04/07/2015  9:50 PM  Output (mL) 100 mL 04/08/2015  6:00 AM     Rectal Tube/Pouch (Active)  Output (mL) 0 mL 04/08/2015  6:00 AM  Intake (mL) 0 mL 04/06/2015  6:45 PM     Urethral Catheter Ricky Molina, NT Non-latex 14 Fr. (Active)  Indication for Insertion or Continuance of Catheter Unstable critical patients (first 24-48 hours);Other (comment) 04/08/2015  8:00 AM  Site Assessment Clean;Intact;Dry 04/07/2015  8:00 PM  Catheter Maintenance Bag below level of bladder;Catheter secured;Drainage bag/tubing not touching floor;Insertion date on drainage bag;No dependent loops;Seal intact 04/08/2015  8:00 AM  Collection Container Standard drainage bag 04/07/2015  8:00 PM  Securement Method Leg strap 04/07/2015  8:00 PM  Urinary Catheter Interventions Unclamped 04/07/2015  8:00 PM  Input (mL) 0 mL 04/05/2015  7:30 AM  Output (mL) 50 mL 04/08/2015  6:00 AM    Microbiology/Sepsis markers: Results for orders placed or performed during the hospital encounter of 02/27/2015  MRSA PCR Screening     Status: None   Collection Time: 03/06/15  5:23 AM  Result Value Ref Range Status   MRSA by PCR NEGATIVE NEGATIVE Final  Comment:        The GeneXpert MRSA Assay (FDA approved for NASAL specimens only), is one component of a comprehensive MRSA colonization surveillance program. It is not intended to diagnose MRSA infection nor to guide or monitor treatment for MRSA infections.   Culture, respiratory  (NON-Expectorated)     Status: None   Collection Time: 03/09/15 11:22 AM  Result Value Ref Range Status   Specimen Description TRACHEAL ASPIRATE  Final   Special Requests Normal  Final   Gram Stain   Final    ABUNDANT WBC PRESENT, PREDOMINANTLY PMN FEW SQUAMOUS EPITHELIAL CELLS PRESENT FEW GRAM POSITIVE COCCI IN PAIRS Performed at [facility]    Culture   Final    ABUNDANT GROUP B STREP(S.AGALACTIAE)ISOLATED Performed at [facility]    Report Status 03/11/2015 FINAL  Final  Culture, blood (Routine X 2) w Reflex to ID Panel     Status: None   Collection Time: 03/15/15 10:32 PM  Result Value Ref Range Status   Specimen Description BLOOD LEFT ANTECUBITAL  Final   Special Requests BOTTLES DRAWN AEROBIC AND ANAEROBIC 10CC  Final   Culture  Setup Time   Final    GRAM POSITIVE COCCI IN CLUSTERS ANAEROBIC BOTTLE ONLY CRITICAL RESULT CALLED TO, READ BACK BY AND VERIFIED WITH: [facility] AT 1556 03/16/15 BY L BENFIELD    Culture   Final    STAPHYLOCOCCUS SPECIES (COAGULASE NEGATIVE) THE SIGNIFICANCE OF ISOLATING THIS ORGANISM FROM A SINGLE SET OF BLOOD CULTURES WHEN MULTIPLE SETS ARE DRAWN IS UNCERTAIN. PLEASE NOTIFY THE MICROBIOLOGY DEPARTMENT WITHIN ONE WEEK IF SPECIATION AND SENSITIVITIES ARE REQUIRED.    Report Status 03/19/2015 FINAL  Final  Culture, blood (Routine X 2) w Reflex to ID Panel     Status: None   Collection Time: 03/15/15 10:41 PM  Result Value Ref Range Status   Specimen Description BLOOD LEFT HAND  Final   Special Requests BOTTLES DRAWN AEROBIC ONLY 10CC  Final   Culture NO GROWTH 5 DAYS  Final   Report Status 03/20/2015 FINAL  Final  Culture, respiratory (NON-Expectorated)     Status: None   Collection Time: 03/16/15 12:02 AM  Result Value Ref Range Status   Specimen Description TRACHEAL ASPIRATE  Final   Special Requests NONE  Final   Gram Stain   Final    NO WBC SEEN NO SQUAMOUS EPITHELIAL CELLS SEEN NO ORGANISMS SEEN Performed at [facility]    Culture   Final    NORMAL OROPHARYNGEAL FLORA Performed at [facility]    Report Status 03/18/2015 FINAL  Final  Culture, blood (Routine X 2) w Reflex to ID Panel     Status: None   Collection Time: 03/20/15  1:30 PM  Result Value Ref Range Status   Specimen Description BLOOD LEFT ANTECUBITAL  Final   Special Requests BOTTLES DRAWN AEROBIC AND ANAEROBIC 10CC  Final   Culture NO GROWTH 5 DAYS  Final   Report Status 03/25/2015 FINAL  Final  Culture, blood (Routine X 2) w Reflex to ID Panel     Status: None   Collection Time: 03/20/15  1:40 PM  Result Value Ref Range Status   Specimen Description BLOOD LEFT HAND  Final   Special Requests BOTTLES DRAWN AEROBIC AND ANAEROBIC 10CC  Final   Culture NO GROWTH 5 DAYS  Final   Report Status 03/25/2015 FINAL  Final  C difficile quick scan w PCR reflex     Status:  None   Collection Time: 03/25/15  2:01 PM  Result Value Ref Range Status   C Diff antigen NEGATIVE NEGATIVE Final   C Diff toxin NEGATIVE NEGATIVE Final   C Diff interpretation Negative for toxigenic C. difficile  Final  Culture, blood (routine x 2)     Status: None   Collection Time: 04/02/15 12:04 AM  Result Value Ref Range Status   Specimen Description BLOOD LEFT ANTECUBITAL  Final   Special Requests BOTTLES DRAWN AEROBIC AND ANAEROBIC 5CC  Final   Culture  Setup Time   Final    GRAM POSITIVE RODS AEROBIC BOTTLE ONLY CRITICAL RESULT CALLED TO, READ BACK BY AND VERIFIED WITH: K ADKINS,RN AT 1431 04/03/15 BY L BENFIELD    Culture   Final    DIPHTHEROIDS(CORYNEBACTERIUM SPECIES) Standardized susceptibility testing for this organism is not available.    Report Status 04/05/2015 FINAL  Final  Culture, blood (routine x 2)     Status: None   Collection Time: 04/02/15 12:16 AM  Result Value Ref Range Status   Specimen Description BLOOD LEFT ANTECUBITAL  Final   Special Requests BOTTLES DRAWN AEROBIC AND ANAEROBIC 5CC  Final   Culture NO GROWTH 5 DAYS   Final   Report Status 04/07/2015 FINAL  Final  Culture, body fluid-bottle     Status: None   Collection Time: 04/02/15  1:54 AM  Result Value Ref Range Status   Specimen Description FLUID  Final   Special Requests PLEURAL AEROBIC BOTTLE ONLY  Final   Culture NO GROWTH 5 DAYS  Final   Report Status 04/07/2015 FINAL  Final  Gram stain     Status: None   Collection Time: 04/02/15  1:54 AM  Result Value Ref Range Status   Specimen Description FLUID  Final   Special Requests PLEURAL  Final   Gram Stain   Final    CYTOSPIN SMEAR WBC PRESENT,BOTH PMN AND MONONUCLEAR NO ORGANISMS SEEN    Report Status 04/02/2015 FINAL  Final  Culture, respiratory (NON-Expectorated)     Status: None   Collection Time: 04/02/15  7:29 AM  Result Value Ref Range Status   Specimen Description TRACHEAL ASPIRATE  Final   Special Requests NONE  Final   Gram Stain   Final    FEW WBC PRESENT,BOTH PMN AND MONONUCLEAR RARE SQUAMOUS EPITHELIAL CELLS PRESENT FEW GRAM POSITIVE RODS [facility]    Culture   Final    NORMAL OROPHARYNGEAL FLORA Performed at [facility]    Report Status 04/04/2015 FINAL  Final    Anti-infectives:  Anti-infectives    Start     Dose/Rate Route Frequency Ordered Stop   04/06/15 1400  piperacillin-tazobactam (ZOSYN) IVPB 2.25 g     2.25 g 100 mL/hr over 30 Minutes Intravenous 3 times per day 04/06/15 1005     04/02/15 0800  piperacillin-tazobactam (ZOSYN) IVPB 3.375 g  Status:  Discontinued     3.375 g 12.5 mL/hr over 240 Minutes Intravenous 3 times per day 04/01/15 2356 04/06/15 1005   04/02/15 0800  vancomycin (VANCOCIN) 1,250 mg in sodium chloride 0.9 % 250 mL IVPB  Status:  Discontinued     1,250 mg 166.7 mL/hr over 90 Minutes Intravenous Every 8 hours 04/01/15 2356 04/03/15 1337   04/02/15 0000  piperacillin-tazobactam (ZOSYN) IVPB 3.375 g     3.375 g 100 mL/hr over 30 Minutes Intravenous STAT 04/01/15 2356 04/02/15 0137   04/02/15 0000   vancomycin (VANCOCIN) 2,000 mg in  sodium chloride 0.9 % 500 mL IVPB     2,000 mg 250 mL/hr over 120 Minutes Intravenous STAT 04/01/15 2356 04/02/15 0230   03/18/15 0800  vancomycin (VANCOCIN) 1,250 mg in sodium chloride 0.9 % 250 mL IVPB  Status:  Discontinued     1,250 mg 166.7 mL/hr over 90 Minutes Intravenous Every 8 hours 03/18/15 0750 03/19/15 1517   03/17/15 0930  cefTRIAXone (ROCEPHIN) 2 g in dextrose 5 % 50 mL IVPB  Status:  Discontinued     2 g 100 mL/hr over 30 Minutes Intravenous Every 24 hours 03/17/15 0915 03/22/15 1033   03/16/15 2000  vancomycin (VANCOCIN) 1,500 mg in sodium chloride 0.9 % 500 mL IVPB  Status:  Discontinued     1,500 mg 250 mL/hr over 120 Minutes Intravenous Every 24 hours 03/16/15 1929 03/18/15 0750   03/14/15 0900  cefTRIAXone (ROCEPHIN) 1 g in dextrose 5 % 50 mL IVPB     1 g 100 mL/hr over 30 Minutes Intravenous Every 24 hours 03/14/15 0835 03/16/15 0825   03/12/15 0900  cefTRIAXone (ROCEPHIN) injection 1 g  Status:  Discontinued     1 g Intramuscular Every 24 hours 03/12/15 0851 03/14/15 0835   03/09/15 1030  piperacillin-tazobactam (ZOSYN) IVPB 3.375 g  Status:  Discontinued     3.375 g 12.5 mL/hr over 240 Minutes Intravenous Every 8 hours 03/09/15 1000 03/12/15 0851      Best Practice/Protocols:  VTE Prophylaxis: Lovenox (prophylaxtic dose) and Mechanical GI Prophylaxis: Proton Pump Inhibitor No sedation recently.  Consults: Treatment Team:  Leata Mouse, MD Palliative Remus Blake, MD    Events:  Subjective:    Overnight Issues: No issues except for expulsion of rectal tube with significant diarrhea.  Objective:  Vital signs for last 24 hours: Temp:  [96.3 F (35.7 C)-98.4 F (36.9 C)] 96.3 F (35.7 C) (03/03 0900) Pulse Rate:  [88-120] 91 (03/03 0809) Resp:  [15-33] 20 (03/03 0809) BP: (110-164)/(66-100) 164/69 mmHg (03/03 0700) SpO2:  [91 %-99 %] 98 % (03/03 0810) Arterial Line BP: (119-168)/(66-88)  166/71 mmHg (03/03 0700) FiO2 (%):  [40 %] 40 % (03/03 0810) Weight:  [126.5 kg (278 lb 14.1 oz)] 126.5 kg (278 lb 14.1 oz) (03/03 0421)  Hemodynamic parameters for last 24 hours:    Intake/Output from previous day: 03/02 0701 - 03/03 0700 In: 3680.7 [I.V.:1340.7; NG/GT:910; IV Piggyback:350; TPN:1080] Out: 2225 [Urine:225; Emesis/NG output:1400; Stool:600]  Intake/Output this shift:    Vent settings for last 24 hours: Vent Mode:  [-] CPAP;PSV FiO2 (%):  [40 %] 40 % Set Rate:  [20 bmp] 20 bmp Vt Set:  [600 mL] 600 mL PEEP:  [2 cmH20-5 cmH20] 5 cmH20 Pressure Support:  [5 cmH20-10 cmH20] 5 cmH20 Plateau Pressure:  [26 cmH20-29 cmH20] 26 cmH20  Physical Exam:  General: no respiratory distress and somnolent and not responding to pain Neuro: nonfocal exam and RASS -1 Resp: clear to auscultation bilaterally CVS: regular rate and rhythm, S1, S2 normal, no murmur, click, rub or gallop and Mild sinus tachycardia GI: distended, hypoactive BS, wound clean and Significant ascites Extremities: edema 1+ and edema in upper extremities has significantly improved, better in lower extremities.  Results for orders placed or performed during the hospital encounter of 03/06/2015 (from the past 24 hour(s))  Glucose, capillary     Status: Abnormal   Collection Time: 04/07/15 12:31 PM  Result Value Ref Range   Glucose-Capillary 152 (H) 65 - 99 mg/dL   Comment 1 Notify RN  Glucose, capillary     Status: Abnormal   Collection Time: 04/07/15  4:27 PM  Result Value Ref Range   Glucose-Capillary 147 (H) 65 - 99 mg/dL  Glucose, capillary     Status: Abnormal   Collection Time: 04/07/15  7:16 PM  Result Value Ref Range   Glucose-Capillary 135 (H) 65 - 99 mg/dL   Comment 1 Capillary Specimen    Comment 2 Notify RN    Comment 3 Document in Chart   Glucose, capillary     Status: Abnormal   Collection Time: 04/08/15 12:12 AM  Result Value Ref Range   Glucose-Capillary 158 (H) 65 - 99 mg/dL   Comment  1 Capillary Specimen    Comment 2 Notify RN    Comment 3 Document in Chart   Ammonia     Status: Abnormal   Collection Time: 04/08/15  4:10 AM  Result Value Ref Range   Ammonia 62 (H) 9 - 35 umol/L  Glucose, capillary     Status: Abnormal   Collection Time: 04/08/15  4:15 AM  Result Value Ref Range   Glucose-Capillary 155 (H) 65 - 99 mg/dL   Comment 1 Arterial Specimen    Comment 2 Notify RN   Basic metabolic panel     Status: Abnormal   Collection Time: 04/08/15  4:27 AM  Result Value Ref Range   Sodium 140 135 - 145 mmol/L   Potassium 3.5 3.5 - 5.1 mmol/L   Chloride 108 101 - 111 mmol/L   CO2 19 (L) 22 - 32 mmol/L   Glucose, Bld 167 (H) 65 - 99 mg/dL   BUN 88 (H) 6 - 20 mg/dL   Creatinine, Ser 1.61 (H) 0.61 - 1.24 mg/dL   Calcium 8.3 (L) 8.9 - 10.3 mg/dL   GFR calc non Af Amer 9 (L) >60 mL/min   GFR calc Af Amer 11 (L) >60 mL/min   Anion gap 13 5 - 15     Assessment/Plan:   NEURO  Altered Mental Status:  encephalopathy and hepatic encephalopathy   Plan: Waxes and wanes in level of consciousness  PULM  Atelectasis/collapse (bibasilar)   Plan: CPM  CARDIO  Sinus Tachycardia   Plan: Mild and requires no specific treatment  RENAL  Oliguria (suspect ATN and hepatorenal syndrome)   Plan: Per renal service, but family wants full support, and at this point that may require dialysis  GI  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  ENDO No known issues   Plan: CPM  Global Issues  We are committed to give this patient the full push at least one last time, and that includes dialysis and possibly a tracheostomy next week.  We will not make an  attempt to wean him off the ventilator this weekend, although I will ask the CCM team to help out this weekend.    LOS: 34 days   Additional comments:I reviewed the patient's new clinical lab test results. bmet, ammonia   Critical Care Total Time*: 30 Minutes  Ricky Molina 04/08/2015  *Care during the described time interval was provided by me and/or other providers on the critical care team.  I have reviewed this  patient's available data, including medical history, events of note, physical examination and test results as part of my evaluation.
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
## 2015-04-08 NOTE — Progress Notes (Signed)
PULMONARY / CRITICAL CARE MEDICINE   Name: Ricky SkatesDaniel W Moraes Jr. MRN: [id number] DOB: 11-15-1965    ADMISSION DATE:  10-Mar-2015 CONSULTATION DATE:  04/02/2015  REFERRING MD :  Trauma  CHIEF COMPLAINT:  Acute encephalopathy, unable to protect airway  SUBJECTIVE:  Tolerating pressure support.  VITAL SIGNS: BP 164/69 mmHg  Pulse 91  Temp(Src) 96.3 F (35.7 C) (Axillary)  Resp 20  Ht 5\' 9"  (1.753 m)  Wt 278 lb 14.1 oz (126.5 kg)  BMI 41.16 kg/m2  SpO2 98%  VENTILATOR SETTINGS: Vent Mode:  [-] CPAP;PSV FiO2 (%):  [40 %] 40 % Set Rate:  [20 bmp] 20 bmp Vt Set:  [600 mL] 600 mL PEEP:  [2 cmH20-5 cmH20] 5 cmH20 Pressure Support:  [5 cmH20-10 cmH20] 5 cmH20 Plateau Pressure:  [26 cmH20-29 cmH20] 26 cmH20  INTAKE / OUTPUT: I/O last 3 completed shifts: In: 5583.3 [I.V.:1843.3; NG/GT:1390; IV Piggyback:550] Out: 3415 [Urine:315; Emesis/NG output:2100; Stool:1000]  PHYSICAL EXAMINATION: General: somnolent Neuro: opens eyes with stimulation, not following commands HEENT: jaundice Cardiac: regular, no murmur Chest: no wheeze Abd: distended, wound site clean, non tender Ext: 2+ edema Skin: no rashes  LABS:  CBC  Recent Labs Lab 04/05/15 0400 04/06/15 0415 04/07/15 0300  WBC 4.1 3.4* 3.2*  HGB 9.3* 9.4* 8.8*  HCT 30.7* 32.3* 29.1*  PLT 52* 41* 43*   Coag's  Recent Labs Lab 04/02/15 0409  APTT 45*  INR 1.45   BMET  Recent Labs Lab 04/06/15 0550 04/07/15 0300 04/08/15 0427  NA 140 141 140  K 3.5 3.4* 3.5  CL 108 108 108  CO2 20* 19* 19*  BUN 71* 76* 88*  CREATININE 5.82* 6.28* 6.33*  GLUCOSE 136* 143* 167*   Electrolytes  Recent Labs Lab 04/05/15 0400 04/06/15 0550 04/07/15 0300 04/08/15 0427  CALCIUM 7.3* 7.7* 7.9* 8.3*  MG 2.0 2.0 2.0  --   PHOS 4.7* 5.4* 5.5*  --    Sepsis Markers  Recent Labs Lab 04/02/15 0019 04/02/15 0306  LATICACIDVEN 1.8 1.8   ABG  Recent Labs Lab 04/01/15 2310 04/02/15 0026 04/03/15 0330  PHART 7.404  7.392 7.504*  PCO2ART 54.9* 51.7* 35.9  PO2ART 77.5* 66.0* 70.0*   Liver Enzymes  Recent Labs Lab 04/04/15 0400 04/06/15 0550 04/07/15 0300  AST 145* 153* 169*  ALT 87* 73* 76*  ALKPHOS 71 64 67  BILITOT 5.1* 4.8* 4.6*  ALBUMIN 1.4* 1.8* 2.1*   Glucose  Recent Labs Lab 04/07/15 1231 04/07/15 1627 04/07/15 1916 04/08/15 0012 04/08/15 0415 04/08/15 0928  GLUCAP 152* 147* 135* 158* 155* 133*    Imaging No results found.   CULTURES: Sputum 2/01 >> Group B strep Blood 2/07 >> coag neg staph C diff 2/17 >> negative Blood 2/25 >> Diphtheroids Peritoneal fluid 2/25 >> negative Sputum 2/25 >> oral flora  ANTIBIOTICS: 2/24 Vancomycin >> 2/26 2/24 Zosyn >>   STUDIES: 2/27 EEG >> diffuse slowing  SIGNIFICANT EVENTS: 1/28 - 2/12 intubated for surgery with prolonged course on vent.  2/25 progressive encephalopathy with worsening abdominal distension despite lactulose with substantial bowel movements. Made DNR 2/27 Renal consulted 3/01 Code status reversed >> full code; paracentesis >> 3 liters removed  LINES/TUBES: Rt PICC 1/30 >> ETT 2/24 >>  DISCUSSION: 50 yo male presented with hemoperitoneum.  He has hx of alcoholic cirrhosis and seizures.  Hospital course complicated by respiratory failure, shock, AKI.    ASSESSMENT / PLAN:  PULMONARY A: Acute respiratory failure with hypoxia. Emphysema. P:   Continue vent  support >> doubt he would be able to protect airway if extubated >> would need to d/w family if trach and long term vent support are best option for him F/u CXR  CARDIOVASCULAR A:  Septic shock >> off pressors. P:  Monitor hemodynamics  RENAL A:  AKI. Hypokalemia. P:   Renal consulted >> do not think he is a suitable candidate for HD; family agreed to HD on 3/02, but did not want to give consent for HD cath on 3/03 until speaking with other family  GASTROINTESTINAL A:   Alcoholic cirrhosis. Hemoperitoneum on admission >> resolved. S/p  laparotomy with repair of mesenteric and omental laceration. Nutrition. P: Continue tube feeds F/u LFTs Protonix for SUP Continue lactulose to q8h  HEMATOLOGIC A:   Anemia of chronic disease and critical illness. Coagulopathy, thrombocytopenia 2nd to sepsis, cirrhosis. P:  F/u CBC SCDs for DVT prevention  INFECTIOUS A:   Sepsis >> likely from respiratory source. P:   Day 8 of zosyn  ENDOCRINE A:  Hyperglycemia. P:   SSI with lantus  NEUROLOGIC A:   Acute encephalopathy 2nd to sepsis, renal failure, cirrhosis. Hx of seizures. P:   RASS goal 0 Continue lexapro Continue thiamine, folic acid, MVI continue keppra  Goals of care >> was DNR, but then code status reversed 3/01.  I think his prognosis for meaningful recovery is grim.  Best case scenario likely is he will require trach/long term vent support, HD, and chronic nursing facility care.  D/w Dr. Hyman Hopes and Dr. Lindie Spruce.  CC time 38 minutes.  Coralyn Helling, MD [facility] Pulmonary/Critical Care 04/08/2015, 11:14 AM Pager:  [phone] After 3pm call: [phone]

## 2015-04-09 ENCOUNTER — Inpatient Hospital Stay (HOSPITAL_COMMUNITY): Payer: BLUE CROSS/BLUE SHIELD

## 2015-04-09 DIAGNOSIS — J9601 Acute respiratory failure with hypoxia: Secondary | ICD-10-CM

## 2015-04-09 LAB — CBC WITH DIFFERENTIAL/PLATELET
Basophils Absolute: 0 10*3/uL (ref 0.0–0.1)
Basophils Relative: 0 %
EOS ABS: 0.2 10*3/uL (ref 0.0–0.7)
EOS PCT: 7 %
HCT: 27.1 % — ABNORMAL LOW (ref 39.0–52.0)
HEMOGLOBIN: 8.7 g/dL — AB (ref 13.0–17.0)
LYMPHS ABS: 0.9 10*3/uL (ref 0.7–4.0)
Lymphocytes Relative: 29 %
MCH: 34 pg (ref 26.0–34.0)
MCHC: 32.1 g/dL (ref 30.0–36.0)
MCV: 105.9 fL — AB (ref 78.0–100.0)
MONOS PCT: 11 %
Monocytes Absolute: 0.3 10*3/uL (ref 0.1–1.0)
Neutro Abs: 1.6 10*3/uL — ABNORMAL LOW (ref 1.7–7.7)
Neutrophils Relative %: 53 %
Platelets: 55 10*3/uL — ABNORMAL LOW (ref 150–400)
RBC: 2.56 MIL/uL — ABNORMAL LOW (ref 4.22–5.81)
RDW: 16.8 % — ABNORMAL HIGH (ref 11.5–15.5)
WBC: 3 10*3/uL — ABNORMAL LOW (ref 4.0–10.5)

## 2015-04-09 LAB — COMPREHENSIVE METABOLIC PANEL
ALK PHOS: 67 U/L (ref 38–126)
ALT: 79 U/L — AB (ref 17–63)
AST: 167 U/L — AB (ref 15–41)
Albumin: 2.6 g/dL — ABNORMAL LOW (ref 3.5–5.0)
Anion gap: 13 (ref 5–15)
BILIRUBIN TOTAL: 4.3 mg/dL — AB (ref 0.3–1.2)
BUN: 97 mg/dL — AB (ref 6–20)
CO2: 18 mmol/L — ABNORMAL LOW (ref 22–32)
CREATININE: 6.52 mg/dL — AB (ref 0.61–1.24)
Calcium: 8.4 mg/dL — ABNORMAL LOW (ref 8.9–10.3)
Chloride: 109 mmol/L (ref 101–111)
GFR calc Af Amer: 10 mL/min — ABNORMAL LOW (ref >60)
GFR, EST NON AFRICAN AMERICAN: 9 mL/min — AB (ref >60)
Glucose, Bld: 141 mg/dL — ABNORMAL HIGH (ref 65–99)
Potassium: 3.2 mmol/L — ABNORMAL LOW (ref 3.5–5.1)
Sodium: 140 mmol/L (ref 135–145)
TOTAL PROTEIN: 6.5 g/dL (ref 6.5–8.1)

## 2015-04-09 LAB — GLUCOSE, CAPILLARY
GLUCOSE-CAPILLARY: 114 mg/dL — AB (ref 65–99)
Glucose-Capillary: 108 mg/dL — ABNORMAL HIGH (ref 65–99)
Glucose-Capillary: 131 mg/dL — ABNORMAL HIGH (ref 65–99)

## 2015-04-09 LAB — MAGNESIUM: Magnesium: 1.9 mg/dL (ref 1.7–2.4)

## 2015-04-09 LAB — PHOSPHORUS: PHOSPHORUS: 5.3 mg/dL — AB (ref 2.5–4.6)

## 2015-04-09 LAB — AMMONIA: AMMONIA: 69 umol/L — AB (ref 9–35)

## 2015-04-09 LAB — VANCOMYCIN, RANDOM: Vancomycin Rm: 24 ug/mL

## 2015-04-09 MED ORDER — GLYCOPYRROLATE 0.2 MG/ML IJ SOLN
0.2000 mg | INTRAMUSCULAR | Status: DC | PRN
  Administered 2015-04-09 – 2015-04-11 (×3): 0.2 mg via INTRAVENOUS
  Filled 2015-04-09 (×4): qty 1

## 2015-04-09 MED ORDER — LORAZEPAM 2 MG/ML IJ SOLN
2.0000 mg | Freq: Once | INTRAMUSCULAR | Status: AC
  Administered 2015-04-09: 2 mg via INTRAVENOUS
  Filled 2015-04-09: qty 1

## 2015-04-09 MED ORDER — SODIUM CHLORIDE 0.9 % IV SOLN
0.4000 mg/h | INTRAVENOUS | Status: DC
  Administered 2015-04-09: 0.4 mg/h via INTRAVENOUS
  Filled 2015-04-09: qty 5

## 2015-04-09 MED ORDER — ACETAMINOPHEN 325 MG PO TABS: 650.0000 mg | ORAL_TABLET | Freq: Four times a day (QID) | ORAL | Status: DC | PRN

## 2015-04-09 MED ORDER — GLYCOPYRROLATE 0.2 MG/ML IJ SOLN: 0.2000 mg | INTRAMUSCULAR | Status: DC | PRN

## 2015-04-09 MED ORDER — HALOPERIDOL LACTATE 5 MG/ML IJ SOLN: 0.5000 mg | INTRAMUSCULAR | Status: DC | PRN

## 2015-04-09 MED ORDER — LORAZEPAM 2 MG/ML PO CONC: 1.0000 mg | ORAL | Status: DC | PRN

## 2015-04-09 MED ORDER — POTASSIUM CHLORIDE 10 MEQ/50ML IV SOLN
10.0000 meq | INTRAVENOUS | Status: AC
  Administered 2015-04-09 (×4): 10 meq via INTRAVENOUS
  Filled 2015-04-09 (×4): qty 50

## 2015-04-09 MED ORDER — SODIUM CHLORIDE 0.9 % IV SOLN
500.0000 mg | Freq: Two times a day (BID) | INTRAVENOUS | Status: DC
  Administered 2015-04-09 – 2015-04-11 (×4): 500 mg via INTRAVENOUS
  Filled 2015-04-09 (×6): qty 5

## 2015-04-09 MED ORDER — HALOPERIDOL LACTATE 2 MG/ML PO CONC: 0.5000 mg | ORAL | Status: DC | PRN

## 2015-04-09 MED ORDER — TRACE MINERALS CR-CU-MN-SE-ZN 10-1000-500-60 MCG/ML IV SOLN
INTRAVENOUS | Status: DC
  Filled 2015-04-09: qty 960

## 2015-04-09 MED ORDER — ACETAMINOPHEN 650 MG RE SUPP: 650.0000 mg | Freq: Four times a day (QID) | RECTAL | Status: DC | PRN

## 2015-04-09 MED ORDER — POTASSIUM CHLORIDE 10 MEQ/100ML IV SOLN: 10.0000 meq | INTRAVENOUS | Status: DC

## 2015-04-09 MED ORDER — BISACODYL 10 MG RE SUPP: 10.0000 mg | Freq: Every day | RECTAL | Status: DC | PRN

## 2015-04-09 MED ORDER — HALOPERIDOL 1 MG PO TABS
0.5000 mg | ORAL_TABLET | ORAL | Status: DC | PRN
  Filled 2015-04-09: qty 1

## 2015-04-09 MED ORDER — LORAZEPAM 2 MG/ML IJ SOLN: 1.0000 mg | INTRAMUSCULAR | Status: DC | PRN

## 2015-04-09 MED ORDER — LORAZEPAM 1 MG PO TABS: 1.0000 mg | ORAL_TABLET | ORAL | Status: DC | PRN

## 2015-04-09 MED ORDER — GLYCOPYRROLATE 1 MG PO TABS
1.0000 mg | ORAL_TABLET | ORAL | Status: DC | PRN
  Filled 2015-04-09: qty 1

## 2015-04-09 MED ORDER — HYDROMORPHONE BOLUS VIA INFUSION
0.5000 mg | INTRAVENOUS | Status: DC | PRN
  Filled 2015-04-09: qty 1

## 2015-04-09 NOTE — Progress Notes (Signed)
[facility] ROUNDING NOTE   Subjective:   Interval History: sedated and intubated   Objective:  Vital signs in last 24 hours:  Temp:  [97.1 F (36.2 C)-97.8 F (36.6 C)] 97.4 F (36.3 C) (03/04 0800) Pulse Rate:  [80-97] 94 (03/04 1000) Resp:  [16-23] 17 (03/04 1000) BP: (86-159)/(39-78) 122/71 mmHg (03/04 1000) SpO2:  [96 %-99 %] 97 % (03/04 1000) Arterial Line BP: (109-158)/(40-62) 135/61 mmHg (03/04 1000) FiO2 (%):  [40 %] 40 % (03/04 0819) Weight:  [126 kg (277 lb 12.5 oz)] 126 kg (277 lb 12.5 oz) (03/04 0500)  Weight change: -0.5 kg (-1 lb 1.6 oz) Filed Weights   04/07/15 0200 04/08/15 0421 04/09/15 0500  Weight: 125.2 kg (276 lb 0.3 oz) 126.5 kg (278 lb 14.1 oz) 126 kg (277 lb 12.5 oz)    Intake/Output: I/O last 3 completed shifts: In: 46 [I.V.:2254; Other:10; NG/GT:1820; IV Piggyback:550] Out: 4375 [Urine:2625; Emesis/NG output:1300; Stool:450]   Intake/Output this shift:  Total I/O In: 750 [I.V.:300; NG/GT:150; IV Piggyback:100; TPN:200] Out: 200 [Urine:200]  icteric CVS- RRR RS- CTA diminished at base ABD- Tense with ascites  EXT- 3 + edema   Basic Metabolic Panel:  Recent Labs Lab 04/05/15 0400 04/06/15 0550 04/07/15 0300 04/08/15 0427 04/09/15 0521  NA 142 140 141 140 140  K 3.4* 3.5 3.4* 3.5 3.2*  CL 109 108 108 108 109  CO2 21* 20* 19* 19* 18*  GLUCOSE 137* 136* 143* 167* 141*  BUN 60* 71* 76* 88* 97*  CREATININE 4.96* 5.82* 6.28* 6.33* 6.52*  CALCIUM 7.3* 7.7* 7.9* 8.3* 8.4*  MG 2.0 2.0 2.0  --  1.9  PHOS 4.7* 5.4* 5.5*  --  5.3*    Liver Function Tests:  Recent Labs Lab 04/03/15 0337 04/04/15 0400 04/06/15 0550 04/07/15 0300 04/09/15 0521  AST 225* 145* 153* 169* 167*  ALT 111* 87* 73* 76* 79*  ALKPHOS 74 71 64 67 67  BILITOT 5.8* 5.1* 4.8* 4.6* 4.3*  PROT 6.6 6.5 6.3* 6.2* 6.5  ALBUMIN 1.5* 1.4* 1.8* 2.1* 2.6*   No results for input(s): LIPASE, AMYLASE in the last 168 hours.  Recent Labs Lab  04/07/15 0830 04/08/15 0410 04/09/15 0518  AMMONIA 48* 62* 69*    CBC:  Recent Labs Lab 04/04/15 0400 04/05/15 0400 04/06/15 0415 04/07/15 0300 04/09/15 0521  WBC 8.8 4.1 3.4* 3.2* 3.0*  NEUTROABS  --   --  2.2 2.0 1.6*  HGB 9.8* 9.3* 9.4* 8.8* 8.7*  HCT 32.7* 30.7* 32.3* 29.1* 27.1*  MCV 109.7* 108.5* 111.4* 106.6* 105.9*  PLT 78* 52* 41* 43* 55*    Cardiac Enzymes: No results for input(s): CKTOTAL, CKMB, CKMBINDEX, TROPONINI in the last 168 hours.  BNP: Invalid input(s): POCBNP  CBG:  Recent Labs Lab 04/08/15 1709 04/08/15 1913 04/08/15 2338 04/09/15 0321 04/09/15 0747  GLUCAP 118* 111* 107* 108* 131*    Microbiology: Results for orders placed or performed during the hospital encounter of 03-26-15  MRSA PCR Screening     Status: None   Collection Time: 03/06/15  5:23 AM  Result Value Ref Range Status   MRSA by PCR NEGATIVE NEGATIVE Final    Comment:        The GeneXpert MRSA Assay (FDA approved for NASAL specimens only), is one component of a comprehensive MRSA colonization surveillance program. It is not intended to diagnose MRSA infection nor to guide or monitor treatment for MRSA infections.   Culture, respiratory (NON-Expectorated)     Status: None  Collection Time: 03/09/15 11:22 AM  Result Value Ref Range Status   Specimen Description TRACHEAL ASPIRATE  Final   Special Requests Normal  Final   Gram Stain   Final    ABUNDANT WBC PRESENT, PREDOMINANTLY PMN FEW SQUAMOUS EPITHELIAL CELLS PRESENT FEW GRAM POSITIVE COCCI IN PAIRS Performed at [facility]    Culture   Final    ABUNDANT GROUP B STREP(S.AGALACTIAE)ISOLATED Performed at [facility]    Report Status 03/11/2015 FINAL  Final  Culture, blood (Routine X 2) w Reflex to ID Panel     Status: None   Collection Time: 03/15/15 10:32 PM  Result Value Ref Range Status   Specimen Description BLOOD LEFT ANTECUBITAL  Final   Special Requests BOTTLES DRAWN AEROBIC AND  ANAEROBIC 10CC  Final   Culture  Setup Time   Final    GRAM POSITIVE COCCI IN CLUSTERS ANAEROBIC BOTTLE ONLY CRITICAL RESULT CALLED TO, READ BACK BY AND VERIFIED WITH: M [facility] AT 1556 03/16/15 BY L BENFIELD    Culture   Final    STAPHYLOCOCCUS SPECIES (COAGULASE NEGATIVE) THE SIGNIFICANCE OF ISOLATING THIS ORGANISM FROM A SINGLE SET OF BLOOD CULTURES WHEN MULTIPLE SETS ARE DRAWN IS UNCERTAIN. PLEASE NOTIFY THE MICROBIOLOGY DEPARTMENT WITHIN ONE WEEK IF SPECIATION AND SENSITIVITIES ARE REQUIRED.    Report Status 03/19/2015 FINAL  Final  Culture, blood (Routine X 2) w Reflex to ID Panel     Status: None   Collection Time: 03/15/15 10:41 PM  Result Value Ref Range Status   Specimen Description BLOOD LEFT HAND  Final   Special Requests BOTTLES DRAWN AEROBIC ONLY 10CC  Final   Culture NO GROWTH 5 DAYS  Final   Report Status 03/20/2015 FINAL  Final  Culture, respiratory (NON-Expectorated)     Status: None   Collection Time: 03/16/15 12:02 AM  Result Value Ref Range Status   Specimen Description TRACHEAL ASPIRATE  Final   Special Requests NONE  Final   Gram Stain   Final    NO WBC SEEN NO SQUAMOUS EPITHELIAL CELLS SEEN NO ORGANISMS SEEN Performed at [facility]    Culture   Final    NORMAL OROPHARYNGEAL FLORA Performed at [facility]    Report Status 03/18/2015 FINAL  Final  Culture, blood (Routine X 2) w Reflex to ID Panel     Status: None   Collection Time: 03/20/15  1:30 PM  Result Value Ref Range Status   Specimen Description BLOOD LEFT ANTECUBITAL  Final   Special Requests BOTTLES DRAWN AEROBIC AND ANAEROBIC 10CC  Final   Culture NO GROWTH 5 DAYS  Final   Report Status 03/25/2015 FINAL  Final  Culture, blood (Routine X 2) w Reflex to ID Panel     Status: None   Collection Time: 03/20/15  1:40 PM  Result Value Ref Range Status   Specimen Description BLOOD LEFT HAND  Final   Special Requests BOTTLES DRAWN AEROBIC AND ANAEROBIC 10CC  Final   Culture NO  GROWTH 5 DAYS  Final   Report Status 03/25/2015 FINAL  Final  C difficile quick scan w PCR reflex     Status: None   Collection Time: 03/25/15  2:01 PM  Result Value Ref Range Status   C Diff antigen NEGATIVE NEGATIVE Final   C Diff toxin NEGATIVE NEGATIVE Final   C Diff interpretation Negative for toxigenic C. difficile  Final  Culture, blood (routine x 2)     Status: None   Collection  Time: 04/02/15 12:04 AM  Result Value Ref Range Status   Specimen Description BLOOD LEFT ANTECUBITAL  Final   Special Requests BOTTLES DRAWN AEROBIC AND ANAEROBIC 5CC  Final   Culture  Setup Time   Final    GRAM POSITIVE RODS AEROBIC BOTTLE ONLY CRITICAL RESULT CALLED TO, READ BACK BY AND VERIFIED WITH: K ADKINS,RN AT 1431 04/03/15 BY L BENFIELD    Culture   Final    DIPHTHEROIDS(CORYNEBACTERIUM SPECIES) Standardized susceptibility testing for this organism is not available.    Report Status 04/05/2015 FINAL  Final  Culture, blood (routine x 2)     Status: None   Collection Time: 04/02/15 12:16 AM  Result Value Ref Range Status   Specimen Description BLOOD LEFT ANTECUBITAL  Final   Special Requests BOTTLES DRAWN AEROBIC AND ANAEROBIC 5CC  Final   Culture NO GROWTH 5 DAYS  Final   Report Status 04/07/2015 FINAL  Final  Culture, body fluid-bottle     Status: None   Collection Time: 04/02/15  1:54 AM  Result Value Ref Range Status   Specimen Description FLUID  Final   Special Requests PLEURAL AEROBIC BOTTLE ONLY  Final   Culture NO GROWTH 5 DAYS  Final   Report Status 04/07/2015 FINAL  Final  Gram stain     Status: None   Collection Time: 04/02/15  1:54 AM  Result Value Ref Range Status   Specimen Description FLUID  Final   Special Requests PLEURAL  Final   Gram Stain   Final    CYTOSPIN SMEAR WBC PRESENT,BOTH PMN AND MONONUCLEAR NO ORGANISMS SEEN    Report Status 04/02/2015 FINAL  Final  Culture, respiratory (NON-Expectorated)     Status: None   Collection Time: 04/02/15  7:29 AM  Result  Value Ref Range Status   Specimen Description TRACHEAL ASPIRATE  Final   Special Requests NONE  Final   Gram Stain   Final    FEW WBC PRESENT,BOTH PMN AND MONONUCLEAR RARE SQUAMOUS EPITHELIAL CELLS PRESENT FEW GRAM POSITIVE RODS [facility]formed at [facility]    Culture   Final    NORMAL OROPHARYNGEAL FLORA Performed at [facility]    Report Status 04/04/2015 FINAL  Final    Coagulation Studies: No results for input(s): LABPROT, INR in the last 72 hours.  Urinalysis: No results for input(s): COLORURINE, LABSPEC, PHURINE, GLUCOSEU, HGBUR, BILIRUBINUR, KETONESUR, PROTEINUR, UROBILINOGEN, NITRITE, LEUKOCYTESUR in the last 72 hours.  Invalid input(s): APPERANCEUR    Imaging: Dg Chest Port 1 View  04/09/2015  CLINICAL DATA:  Chest trauma, history diabetes mellitus, alcohol abuse, former smoker EXAM: PORTABLE CHEST 1 VIEW COMPARISON:  Portable exam 0529 hours compared to 04/07/2015 FINDINGS: Tip of endotracheal tube projects 3.8 cm above carina. Nasogastric tube and feeding tube extends into stomach. Tip of RIGHT arm PICC line projects over SVC. Enlargement of cardiac silhouette. Atherosclerotic calcification aorta. Patchy BILATERAL pulmonary infiltrates little changed. No gross pleural effusion or pneumothorax. IMPRESSION: Persistent patchy BILATERAL pulmonary infiltrates. Electronically Signed   By: Ulyses SouthwardMark  Boles M.D.   On: 04/09/2015 09:21     Medications:   . sodium chloride Stopped (04/09/15 1031)  . feeding supplement (VITAL HIGH PROTEIN) 1,000 mL (04/08/15 1727)  . TPN (CLINIMIX) Adult without lytes 40 mL/hr at 04/08/15 1736  . TPN (CLINIMIX) Adult without lytes     . albumin human  12.5 g Intravenous Q6H  . antiseptic oral rinse  7 mL Mouth Rinse QID  . chlorhexidine gluconate  15 mL  Mouth Rinse BID  . escitalopram  20 mg Per Tube Daily  . feeding supplement (PRO-STAT SUGAR FREE 64)  40 mL Per Tube BID  . folic acid  1 mg Per Tube Daily  . insulin aspart  3-18  Units Subcutaneous 6 times per day  . insulin glargine  15 Units Subcutaneous BID  . lactulose  30 g Per Tube 3 times per day  . levETIRAcetam  500 mg Per Tube BID  . pantoprazole sodium  40 mg Per Tube Daily  . piperacillin-tazobactam (ZOSYN)  IV  2.25 g Intravenous 3 times per day  . potassium chloride  10 mEq Intravenous Q1 Hr x 4  . thiamine  100 mg Per Tube Daily   Place/Maintain arterial line **AND** sodium chloride, fentaNYL (SUBLIMAZE) injection, Gerhardt's butt cream, ipratropium-albuterol, metoprolol, midazolam, [DISCONTINUED] ondansetron **OR** ondansetron (ZOFRAN) IV, sodium chloride flush  Assessment/ Plan:   Acute kidney Injury with hypotension and liver disease following paracentesis -- this may indicate hepatorenal syndrome versus ATN --     Electrolytes are all stable  Hypokalemia is mild  Albumin and pressors have been used for hepatorenal syndrome with some mixed success.    Palliative care meeting this afternoon-- prognosis grim   LOS: 35 Ricky Molina W  :11 AM

## 2015-04-09 NOTE — Progress Notes (Signed)
Pt resting comfortably with family at bedside. Offered comfort to family, they have no questions/concerns at this time.

## 2015-04-09 NOTE — Progress Notes (Signed)
   04/09/15 1734  Clinical Encounter Type  Visited With Patient and family together;Health care provider  Visit Type Initial;Patient actively dying  Referral From Physician  Spiritual Encounters  Spiritual Needs Prayer   Chaplain responded to end of life orders. Chaplain introduced spiritual care services to family, offered support and prayer, and our services are available as needed.   Alda PonderAdam M Larell Baney, Chaplain 04/09/2015 5:35 PM
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
## 2015-04-09 NOTE — Procedures (Addendum)
Extubation Procedure Note  Patient Details:   Name: Ricky SkatesDaniel W Douty Jr. DOB: 23-Oct-1965 MRN: [id number]   Pt extubated to 2L [place] for comfort care with no compllcations. Pt tolerating well at this time. RT will continue to monitor.  Evaluation  O2 sats: stable throughout Complications: No apparent complications Patient did not tolerate procedure well. Bilateral Breath Sounds: Diminished Suctioning: Airway No  Harley HallmarkKeen, Jeriah Corkum Lyman 04/09/2015, 4:57 PM

## 2015-04-09 NOTE — Progress Notes (Signed)
I was present for extubation.  Mr. Ricky Molina was premedicated with Dilaudid, Ativan, and Robinul.  He was extubated and placed on 2 L nasal cannula with no complications.   Discussed with family present in the room as well as calling in updating his wife, Ricky Molina, who is blind and disabled and unable to be at the hospital at this time.  He is resting comfortably. Continue comfort care. Titrate medications as needed to ensure comfort.  Romie MinusGene Menna Abeln, MD [facility] Palliative Medicine Team [phone]
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
## 2015-04-09 NOTE — Progress Notes (Signed)
Pharmacy Antibiotic Note  49 YOM continues on day #8 of vancomycin and Zosyn for sepsis/intra-abdominal infection.  Patient's renal function has been worsening.  Vancomycin trough remains supra-therapeutic but trending down.    Plan: - Continue to hold vancomycin given plan to withdraw care - Continue Zosyn 2.25g IV Q8H - Will follow along until patient is transitioned to comfort care   Height: 5\' 9"  (175.3 cm) Weight: 277 lb 12.5 oz (126 kg) IBW/kg (Calculated) : 70.7  Temp (24hrs), Avg:97.5 F (36.4 C), Min:97.1 F (36.2 C), Max:97.8 F (36.6 C)   Recent Labs Lab 04/03/15 1455 04/04/15 0400  04/05/15 0400 04/06/15 0415 04/06/15 0550 04/07/15 0300 04/08/15 0427 04/09/15 0521 04/09/15 0522  WBC  --  8.8  --  4.1 3.4*  --  3.2*  --  3.0*  --   CREATININE  --  4.10*  --  4.96*  --  5.82* 6.28* 6.33* 6.52*  --   VANCOTROUGH 50*  --   --   --   --   --   --   --   --   --   VANCORANDOM  --   --   < > 48  --   --  40  --   --  24  < > = values in this interval not displayed.  Estimated Creatinine Clearance: 18 mL/min (by C-G formula based on Cr of 6.52).    Allergies  Allergen Reactions  . Morphine And Related     Antimicrobials this admission: Zosyn 2/1 >> 2/4; restart 2/25 >> Rocepin 2/4 >> 2/12 Vanc 2/8 >> 2/11; restart 2/25 >>  Dose adjustments this admission: **VT = 50 on 2/26 **VR = 48 on 2/28 **VR = 40 on 3/2 **VR = 24 mcg/mL 3/4 (ke = 0.010115, t1/2 = 68 hrs)  Microbiology results: 2/25 Bld x2 >> 1/2 GPR >> diptheroids 2/25 pleural fluid>negative 2/25 Trach asp > normal flora 2/12 Bld x 2 >> neg 2/17 Cdiff PCR: neg 2/7 Bld x 2 >> 1/2 CONS 2/8 Trach asp >> neg 2/1 Trach asp >> Group B strep 1/29 MRSA PCR: neg    Cameka Rae D. Laney Potashang, PharmD, BCPS Pager:  [phone] 04/09/2015, 3:59 PM

## 2015-04-09 NOTE — Progress Notes (Signed)
PULMONARY / CRITICAL CARE MEDICINE   Name: Ricky SkatesDaniel W Dettinger Jr. MRN: [id number] DOB: 1965-12-14    ADMISSION DATE:  02/06/2015 CONSULTATION DATE:  04/02/2015  REFERRING MD :  Trauma  CHIEF COMPLAINT:  Acute encephalopathy, unable to protect airway  SUBJECTIVE:  Tolerating pressure support.  Palliative care to meet with family today at noon.   VITAL SIGNS: BP 104/62 mmHg  Pulse 94  Temp(Src) 97.4 F (36.3 C) (Axillary)  Resp 16  Ht 5\' 9"  (1.753 m)  Wt 126 kg (277 lb 12.5 oz)  BMI 41.00 kg/m2  SpO2 98%  VENTILATOR SETTINGS: Vent Mode:  [-] PSV;CPAP FiO2 (%):  [40 %] 40 % Set Rate:  [20 bmp] 20 bmp Vt Set:  [60 mL-600 mL] 600 mL PEEP:  [5 cmH20] 5 cmH20 Pressure Support:  [5 cmH20] 5 cmH20 Plateau Pressure:  [20 cmH20-23 cmH20] 22 cmH20  INTAKE / OUTPUT: I/O last 3 completed shifts: In: 195954 [I.V.:2254; Other:10; NG/GT:1820; IV Piggyback:550] Out: 4375 [Urine:2625; Emesis/NG output:1300; Stool:450]  PHYSICAL EXAMINATION: General: somnolent, opens eyes to pain, NAD Neuro: opens eyes with stimulation, not following commands HEENT: jaundice Cardiac: s1s2 regular, no murmur Chest: resps even non labored on PS 5/5, few scattered rhonchi Abd: distended, wound site clean, non tender Ext: 2+ edema Skin: no rashes  LABS:  CBC  Recent Labs Lab 04/06/15 0415 04/07/15 0300 04/09/15 0521  WBC 3.4* 3.2* 3.0*  HGB 9.4* 8.8* 8.7*  HCT 32.3* 29.1* 27.1*  PLT 41* 43* 55*   Coag's No results for input(s): APTT, INR in the last 168 hours. BMET  Recent Labs Lab 04/07/15 0300 04/08/15 0427 04/09/15 0521  NA 141 140 140  K 3.4* 3.5 3.2*  CL 108 108 109  CO2 19* 19* 18*  BUN 76* 88* 97*  CREATININE 6.28* 6.33* 6.52*  GLUCOSE 143* 167* 141*   Electrolytes  Recent Labs Lab 04/06/15 0550 04/07/15 0300 04/08/15 0427 04/09/15 0521  CALCIUM 7.7* 7.9* 8.3* 8.4*  MG 2.0 2.0  --  1.9  PHOS 5.4* 5.5*  --  5.3*   Sepsis Markers No results for input(s):  LATICACIDVEN, PROCALCITON, O2SATVEN in the last 168 hours. ABG  Recent Labs Lab 04/03/15 0330  PHART 7.504*  PCO2ART 35.9  PO2ART 70.0*   Liver Enzymes  Recent Labs Lab 04/06/15 0550 04/07/15 0300 04/09/15 0521  AST 153* 169* 167*  ALT 73* 76* 79*  ALKPHOS 64 67 67  BILITOT 4.8* 4.6* 4.3*  ALBUMIN 1.8* 2.1* 2.6*   Glucose  Recent Labs Lab 04/08/15 1230 04/08/15 1709 04/08/15 1913 04/08/15 2338 04/09/15 0321 04/09/15 0747  GLUCAP 125* 118* 111* 107* 108* 131*    Imaging Dg Chest Port 1 View  04/09/2015  CLINICAL DATA:  Chest trauma, history diabetes mellitus, alcohol abuse, former smoker EXAM: PORTABLE CHEST 1 VIEW COMPARISON:  Portable exam 0529 hours compared to 04/07/2015 FINDINGS: Tip of endotracheal tube projects 3.8 cm above carina. Nasogastric tube and feeding tube extends into stomach. Tip of RIGHT arm PICC line projects over SVC. Enlargement of cardiac silhouette. Atherosclerotic calcification aorta. Patchy BILATERAL pulmonary infiltrates little changed. No gross pleural effusion or pneumothorax. IMPRESSION: Persistent patchy BILATERAL pulmonary infiltrates. Electronically Signed   By: Ulyses SouthwardMark  Boles M.D.   On: 04/09/2015 09:21     CULTURES: Sputum 2/01 >> Group B strep Blood 2/07 >> coag neg staph C diff 2/17 >> negative Blood 2/25 >> Diphtheroids Peritoneal fluid 2/25 >> negative Sputum 2/25 >> oral flora  ANTIBIOTICS: 2/24 Vancomycin >> 2/26 2/24 Zosyn >>  STUDIES: 2/27 EEG >> diffuse slowing  SIGNIFICANT EVENTS: 1/28 - 2/12 intubated for surgery with prolonged course on vent.  2/25 progressive encephalopathy with worsening abdominal distension despite lactulose with substantial bowel movements. Made DNR 2/27 Renal consulted 3/01 Code status reversed >> full code; paracentesis >> 3 liters removed  LINES/TUBES: Rt PICC 1/30 >> ETT 2/24 >>  DISCUSSION: 50 yo male presented with hemoperitoneum.  He has hx of alcoholic cirrhosis and seizures.   Hospital course complicated by respiratory failure, shock, AKI.    ASSESSMENT / PLAN:  PULMONARY A: Acute respiratory failure with hypoxia. Emphysema. P:   Continue vent support >> doubt he would be able to protect airway if extubated >> would need to d/w family if trach and long term vent support are best option for him -- palliative care meeting 3/4 F/u CXR  CARDIOVASCULAR A:  Septic shock >> off pressors. P:  Monitor hemodynamics  RENAL A:  AKI. Hypokalemia. P:   Renal consulted >> do not think he is a suitable candidate for HD; family wanted CRRT initially,  but did not want to give consent for HD cath on 3/03 until speaking with other family  GASTROINTESTINAL A:   Alcoholic cirrhosis. Hemoperitoneum on admission >> resolved. S/p laparotomy with repair of mesenteric and omental laceration. Nutrition. P: Continue tube feeds F/u LFTs Protonix for SUP Continue lactulose Surgery following   HEMATOLOGIC A:   Anemia of chronic disease and critical illness. Coagulopathy, thrombocytopenia 2nd to sepsis, cirrhosis. P:  F/u CBC SCDs for DVT prevention  INFECTIOUS A:   Sepsis >> likely from respiratory source - ?aspiration  P:   Day 9 of zosyn  ENDOCRINE A:  Hyperglycemia. P:   SSI with lantus  NEUROLOGIC A:   Acute encephalopathy 2nd to sepsis, renal failure, cirrhosis. Hx of seizures. P:   RASS goal 0 Continue lexapro Continue thiamine, folic acid, MVI continue keppra  Goals of care >> was DNR, but then code status reversed 3/01.  I think his prognosis for meaningful recovery is grim.  Best case scenario likely is he will require trach/long term vent support, HD, and chronic nursing facility care.  Palliative care to meet again with family 3/4 .     Dirk Dress, NP 04/09/2015  9:52 AM Pager: [phone] or [phone]

## 2015-04-10 MED ORDER — SCOPOLAMINE 1 MG/3DAYS TD PT72
1.0000 | MEDICATED_PATCH | TRANSDERMAL | Status: DC
Start: 2015-04-10 — End: 2015-04-12
  Administered 2015-04-10: 1.5 mg via TRANSDERMAL
  Filled 2015-04-10: qty 1

## 2015-04-10 NOTE — Progress Notes (Signed)
PULMONARY / CRITICAL CARE MEDICINE   Name: Ricky Molina. MRN: [id number] DOB: [birth year]    ADMISSION DATE:  02/25/2015 CONSULTATION DATE:  04/02/2015  REFERRING MD :  Trauma  CHIEF COMPLAINT:  Acute encephalopathy, unable to protect airway  SUBJECTIVE:  Extubated yesterday for comfort care.  Comfortable on dilaudid gtt with PRN ativan.    VITAL SIGNS: BP 141/102 mmHg  Pulse 118  Temp(Src) 97.5 F (36.4 C) (Axillary)  Resp 27  Ht  (1.753 m)  Wt 126 kg (277 lb 12.5 oz)  BMI 41.00 kg/m2  SpO2 99%  VENTILATOR SETTINGS: Vent Mode:  [-] PRVC FiO2 (%):  [40 %] 40 % Set Rate:  [20 bmp] 20 bmp Vt Set:  [600 mL] 600 mL PEEP:  [5 cmH20] 5 cmH20 Pressure Support:  [10 cmH20] 10 cmH20 Plateau Pressure:  [19 cmH20-30 cmH20] 19 cmH20  INTAKE / OUTPUT: I/O last 3 completed shifts: In: 2984.6 [I.V.:851.1; NG/GT:721.5; IV Piggyback:450] Out: 1200 [Urine:850; Stool:350]  PHYSICAL EXAMINATION: General: minimal response, comfortable appearing  Neuro: no response HEENT: jaundice Cardiac: s1s2 regular, no murmur Chest: sonorous respirations  Abd: distended, wound site clean, non tender Ext: 2+ edema Skin: no rashes  LABS:  CBC  Recent Labs Lab 04/06/15 0415 04/07/15 0300 04/09/15 0521  WBC 3.4* 3.2* 3.0*  HGB 9.4* 8.8* 8.7*  HCT 32.3* 29.1* 27.1*  PLT 41* 43* 55*   Coag's No results for input(s): APTT, INR in the last 168 hours. BMET  Recent Labs Lab 04/07/15 0300 04/08/15 0427 04/09/15 0521  NA 141 140 140  K 3.4* 3.5 3.2*  CL 108 108 109  CO2 19* 19* 18*  BUN 76* 88* 97*  CREATININE 6.28* 6.33* 6.52*  GLUCOSE 143* 167* 141*   Electrolytes  Recent Labs Lab 04/06/15 0550 04/07/15 0300 04/08/15 0427 04/09/15 0521  CALCIUM 7.7* 7.9* 8.3* 8.4*  MG 2.0 2.0  --  1.9  PHOS 5.4* 5.5*  --  5.3*   Sepsis Markers No results for input(s): LATICACIDVEN, PROCALCITON, O2SATVEN in the last 168 hours. ABG No results for input(s): PHART, PCO2ART,  PO2ART in the last 168 hours. Liver Enzymes  Recent Labs Lab 04/06/15 0550 04/07/15 0300 04/09/15 0521  AST 153* 169* 167*  ALT 73* 76* 79*  ALKPHOS 64 67 67  BILITOT 4.8* 4.6* 4.3*  ALBUMIN 1.8* 2.1* 2.6*   Glucose  Recent Labs Lab 04/08/15 1709 04/08/15 1913 04/08/15 2338 04/09/15 0321 04/09/15 0747 04/09/15 1137  GLUCAP 118* 111* 107* 108* 131* 114*    Imaging No results found.   CULTURES: Sputum 2/01 >> Group B strep Blood 2/07 >> coag neg staph C diff 2/17 >> negative Blood 2/25 >> Diphtheroids Peritoneal fluid 2/25 >> negative Sputum 2/25 >> oral flora  ANTIBIOTICS: 2/24 Vancomycin >> 2/26 2/24 Zosyn >> 3/4  STUDIES: 2/27 EEG >> diffuse slowing  SIGNIFICANT EVENTS: 1/28 - 2/12 intubated for surgery with prolonged course on vent.  2/25 progressive encephalopathy with worsening abdominal distension despite lactulose with substantial bowel movements. Made DNR 2/27 Renal consulted 3/01 Code status reversed >> full code; paracentesis >> 3 liters removed  LINES/TUBES: Rt PICC 1/30 >> ETT 2/24 >>3/4  DISCUSSION: 50 yo male presented with hemoperitoneum.  He has hx of alcoholic cirrhosis and seizures.  Hospital course complicated by respiratory failure, shock, AKI.    ASSESSMENT / PLAN:  Acute respiratory failure with hypoxia. Possible aspiration  Emphysema. AKI - not suitable candidate for HD Alcoholic cirrhosis. Hemoperitoneum on admission >> resolved. S/p  laparotomy with repair of mesenteric and omental laceration. Coagulopathy, thrombocytopenia 2nd to sepsis, cirrhosis. Seizures   Now comfort care.  Extubated 3/5.  Appears comfortable on dilaudid gtt.  Consider tx out of ICU.     PCCM signing off, please call back if needed.    Dirk DressKaty Dawayne Ohair, NP 04/10/2015  9:29 AM Pager: [phone] or [phone]
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
## 2015-04-10 NOTE — Progress Notes (Signed)
36 Days Post-Op  Subjective: Extubated yesterday Family decided to withdraw support  Objective: Vital signs in last 24 hours: Temp:  [97.5 F (36.4 C)] 97.5 F (36.4 C) (03/04 1200) Pulse Rate:  [94-118] 118 (03/04 1600) Resp:  [16-29] 27 (03/04 1600) BP: (104-165)/(62-102) 141/102 mmHg (03/04 1600) SpO2:  [94 %-99 %] 99 % (03/04 1600) Arterial Line BP: (114-150)/(49-69) 148/69 mmHg (03/04 1500) FiO2 (%):  [40 %] 40 % (03/04 1527) Last BM Date: 04/09/15  Intake/Output from previous day: 03/04 0701 - 03/05 0700 In: 1365.6 [I.V.:342.1; NG/GT:331.5; IV Piggyback:250; TPN:442] Out: 200 [Urine:200] Intake/Output this shift:    Minimally responsive  Lab Results:   Recent Labs  04/09/15 0521  WBC 3.0*  HGB 8.7*  HCT 27.1*  PLT 55*   BMET  Recent Labs  04/08/15 0427 04/09/15 0521  NA 140 140  K 3.5 3.2*  CL 108 109  CO2 19* 18*  GLUCOSE 167* 141*  BUN 88* 97*  CREATININE 6.33* 6.52*  CALCIUM 8.3* 8.4*   PT/INR No results for input(s): LABPROT, INR in the last 72 hours. ABG No results for input(s): PHART, HCO3 in the last 72 hours.  Invalid input(s): PCO2, PO2  Studies/Results: Dg Chest Port 1 View  04/09/2015  CLINICAL DATA:  Chest trauma, history diabetes mellitus, alcohol abuse, former smoker EXAM: PORTABLE CHEST 1 VIEW COMPARISON:  Portable exam 0529 hours compared to 04/07/2015 FINDINGS: Tip of endotracheal tube projects 3.8 cm above carina. Nasogastric tube and feeding tube extends into stomach. Tip of RIGHT arm PICC line projects over SVC. Enlargement of cardiac silhouette. Atherosclerotic calcification aorta. Patchy BILATERAL pulmonary infiltrates little changed. No gross pleural effusion or pneumothorax. IMPRESSION: Persistent patchy BILATERAL pulmonary infiltrates. Electronically Signed   By: Ulyses Southward M.D.   On: 04/09/2015 09:21    Anti-infectives: Anti-infectives    Start     Dose/Rate Route Frequency Ordered Stop   04/06/15 1400   piperacillin-tazobactam (ZOSYN) IVPB 2.25 g  Status:  Discontinued     2.25 g 100 mL/hr over 30 Minutes Intravenous 3 times per day 04/06/15 1005 04/09/15 1626   04/02/15 0800  piperacillin-tazobactam (ZOSYN) IVPB 3.375 g  Status:  Discontinued     3.375 g 12.5 mL/hr over 240 Minutes Intravenous 3 times per day 04/01/15 2356 04/06/15 1005   04/02/15 0800  vancomycin (VANCOCIN) 1,250 mg in sodium chloride 0.9 % 250 mL IVPB  Status:  Discontinued     1,250 mg 166.7 mL/hr over 90 Minutes Intravenous Every 8 hours 04/01/15 2356 04/03/15 1337   04/02/15 0000  piperacillin-tazobactam (ZOSYN) IVPB 3.375 g     3.375 g 100 mL/hr over 30 Minutes Intravenous STAT 04/01/15 2356 04/02/15 0137   04/02/15 0000  vancomycin (VANCOCIN) 2,000 mg in sodium chloride 0.9 % 500 mL IVPB     2,000 mg 250 mL/hr over 120 Minutes Intravenous STAT 04/01/15 2356 04/02/15 0230   03/18/15 0800  vancomycin (VANCOCIN) 1,250 mg in sodium chloride 0.9 % 250 mL IVPB  Status:  Discontinued     1,250 mg 166.7 mL/hr over 90 Minutes Intravenous Every 8 hours 03/18/15 0750 03/19/15 1517   03/17/15 0930  cefTRIAXone (ROCEPHIN) 2 g in dextrose 5 % 50 mL IVPB  Status:  Discontinued     2 g 100 mL/hr over 30 Minutes Intravenous Every 24 hours 03/17/15 0915 03/22/15 1033   03/16/15 2000  vancomycin (VANCOCIN) 1,500 mg in sodium chloride 0.9 % 500 mL IVPB  Status:  Discontinued     1,500  mg 250 mL/hr over 120 Minutes Intravenous Every 24 hours 03/16/15 1929 03/18/15 0750   03/14/15 0900  cefTRIAXone (ROCEPHIN) 1 g in dextrose 5 % 50 mL IVPB     1 g 100 mL/hr over 30 Minutes Intravenous Every 24 hours 03/14/15 0835 03/16/15 0825   03/12/15 0900  cefTRIAXone (ROCEPHIN) injection 1 g  Status:  Discontinued     1 g Intramuscular Every 24 hours 03/12/15 0851 03/14/15 0835   03/09/15 1030  piperacillin-tazobactam (ZOSYN) IVPB 3.375 g  Status:  Discontinued     3.375 g 12.5 mL/hr over 240 Minutes Intravenous Every 8 hours 03/09/15 1000  03/12/15 0851      Assessment/Plan: s/p Procedure(s): EXPLORATORY LAPAROTOMY (Bilateral)  Continue palliative care  LOS: 36 days    Belkys Henault A 04/10/2015

## 2015-04-11 DIAGNOSIS — N179 Acute kidney failure, unspecified: Secondary | ICD-10-CM

## 2015-04-11 DIAGNOSIS — R0600 Dyspnea, unspecified: Secondary | ICD-10-CM

## 2015-04-12 NOTE — Progress Notes (Signed)
Wasted 45mL of didaudid witnessed by Ryland GroupKoKo,RN

## 2015-04-29 ENCOUNTER — Ambulatory Visit: Payer: Self-pay | Admitting: Internal Medicine

## 2015-05-07 NOTE — Clinical Social Work Note (Signed)
Clinical Social Worker continuing to follow patient and family for support and discharge planning needs.  Patient family has decided to transition to full comfort care and patient is actively dying.  Patient wife has been updated over the phone by Palliative Medicine MD.  CSW remains available for support to patient family as needed.  Macario GoldsJesse Suzanne Garbers, Kentucky[place]CS[place] [phone]

## 2015-05-07 NOTE — Progress Notes (Signed)
Nutrition Brief Note  Chart reviewed. Pt now transitioning to comfort care.  No further nutrition interventions warranted at this time.  Please re-consult as needed.   Jocilyn Trego A. Rishika Mccollom, RD, LDN, CDE Pager: [phone] After hours Pager: [phone]  
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
## 2015-05-07 DEATH — deceased

## 2016-01-05 ENCOUNTER — Ambulatory Visit: Payer: Self-pay | Admitting: Internal Medicine
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
## 2017-01-26 IMAGING — CR DG CHEST 1V PORT
1 series · 1 of 1 positions shown · non-contrast
Comparison: Portable chest x-ray March 09, 2015

CLINICAL DATA: Pneumonia, he pneumoperitoneum, alcoholic hepatitis,
diabetes.

EXAM:
PORTABLE CHEST 1 VIEW

[AP]
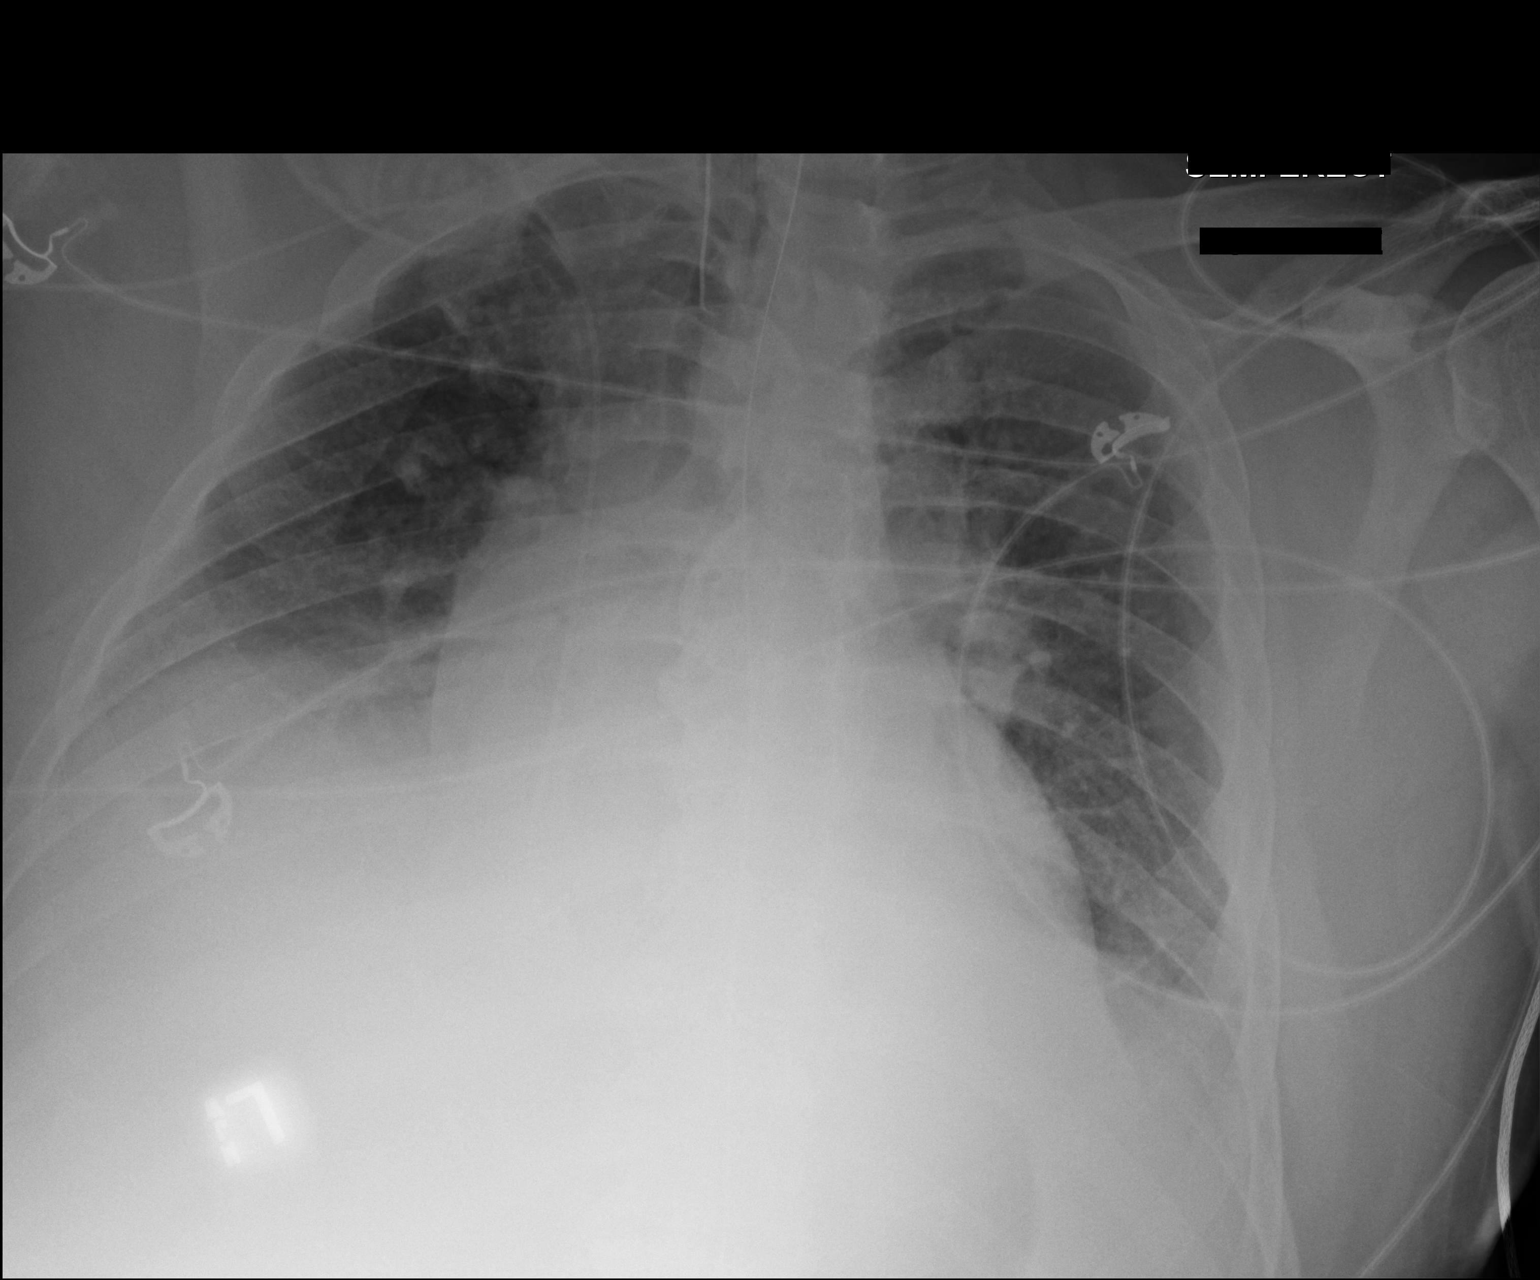

[1 of 1 positions shown; findings below may reference images not displayed]

FINDINGS: The right lung is mildly hypoinflated. The hemidiaphragm is less
well demonstrated. On the left the retrocardiac region is more
dense. The hemidiaphragm is partially obscured. The cardiac
silhouette is enlarged. The central pulmonary vascularity is
prominent. The mediastinum is normal in width. The endotracheal tube
tip lies approximately 5 cm above the carina. The esophagogastric
tube tip projects below the inferior margin of the image. The PICC
line tip projects at the cavoatrial junction.
IMPRESSION: Interval development of bibasilar atelectasis and possible small
bilateral pleural effusions. Stable mild pulmonary vascular
congestion. The support tubes are in reasonable position.

## 2017-01-27 IMAGING — CR DG CHEST 1V PORT
1 series · 1 of 1 positions shown · non-contrast
Comparison: Portable chest x-ray March 10, 2015

CLINICAL DATA: Respiratory failure, hemo peritoneum, diabetes,
former smoker.

EXAM:
PORTABLE CHEST 1 VIEW

[AP]
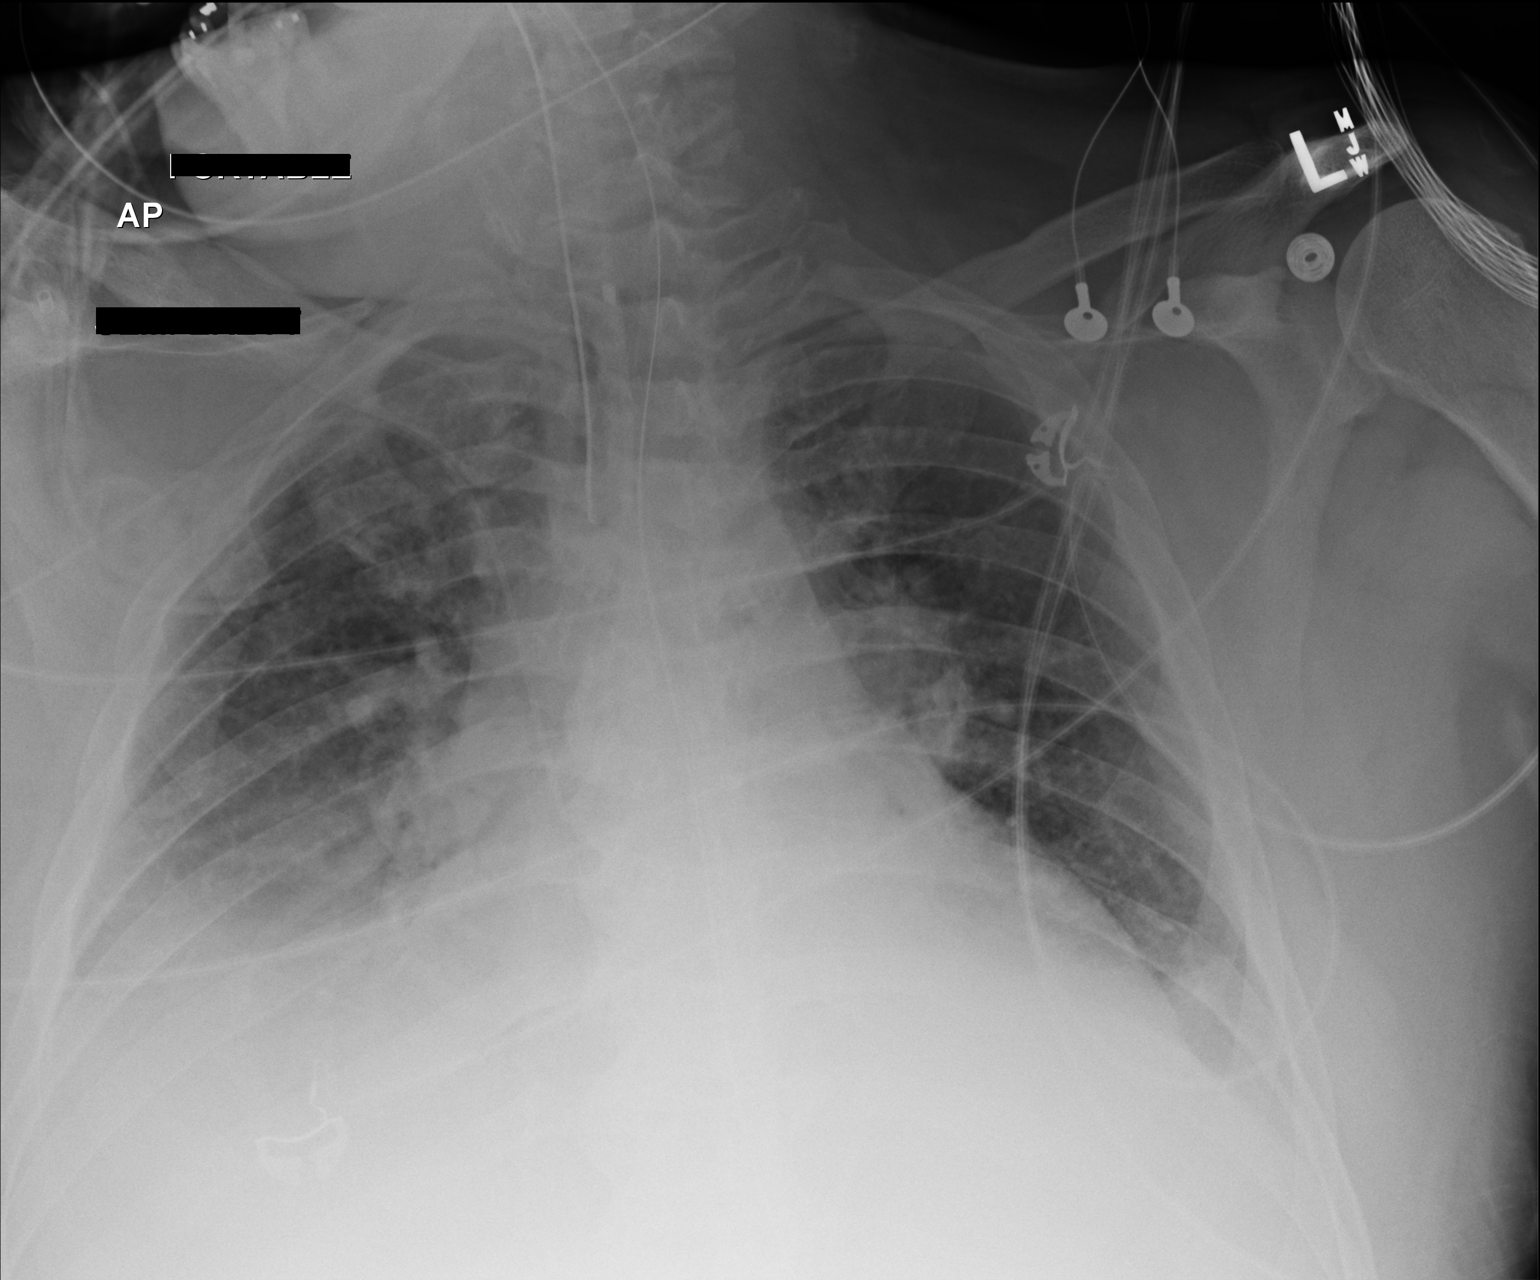

[1 of 1 positions shown; findings below may reference images not displayed]

FINDINGS: There is mild hypo inflation. The hemidiaphragms remain partially
obscured. Coarse bibasilar lung markings are present. There is no
large pleural effusion or evidence of a pneumothorax. The cardiac
silhouette is enlarged. The pulmonary vascularity is mildly
engorged. The endotracheal tube tip lies 3 cm above the carina. The
esophagogastric tube tip projects below the inferior margin of the
image. The right-sided PICC line tip projects over the distal third
of the SVC. The bony thorax exhibits no acute abnormality.
IMPRESSION: Fairly stable appearance of the chest exhibiting hypo inflation,
bibasilar atelectasis or pneumonia, and mild CHF. The support tubes
are in reasonable position.
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
## 2017-02-02 IMAGING — CR DG CHEST 1V PORT
1 series · 1 of 1 positions shown · non-contrast
Comparison: Portable chest x-ray March 16, 2015

CLINICAL DATA: Ventilator dependent respiratory failure, hepatic
encephalopathy, former smoker.

EXAM:
PORTABLE CHEST 1 VIEW

[AP]
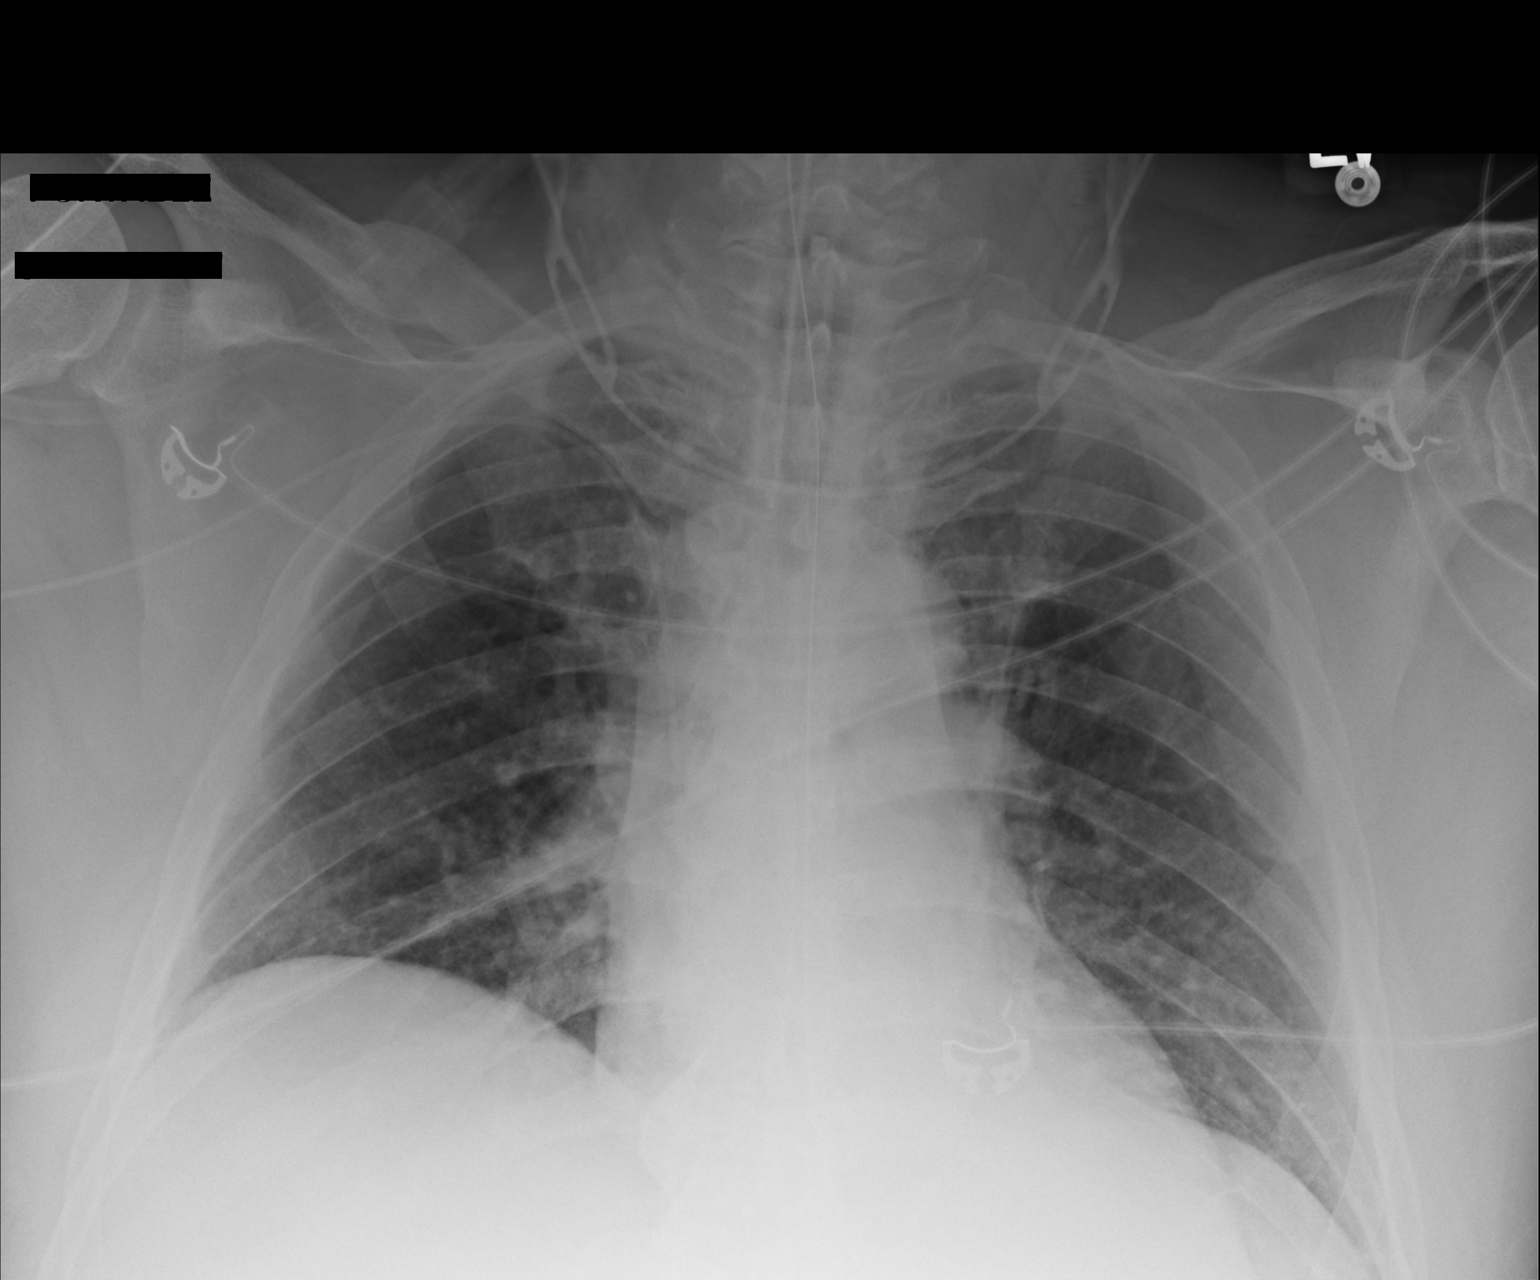

[1 of 1 positions shown; findings below may reference images not displayed]

FINDINGS: The lungs are mildly hypoinflated. There is no focal infiltrate. The
interstitial markings remain coarse. Left lower lobe atelectasis is
slightly less conspicuous today. There is no pleural effusion. The
heart is normal in size. The pulmonary vascularity it remains mildly
prominent centrally.

The endotracheal tube tip lies 4.4 cm above the carina. The
esophagogastric tube tip projects below the inferior margin of the
image. The right-sided PICC line tip projects over the midportion of
the SVC.
IMPRESSION: Slight interval improvement in left lower lobe atelectasis or
pneumonia. Persistent mild central pulmonary vascular congestion.
The support tubes are in reasonable position.
